# Patient Record
Sex: Male | Born: 1955 | Race: Black or African American | Hispanic: No | Marital: Married | Smoking: Former smoker
Health system: Southern US, Community
[De-identification: ages and names within clinical notes are randomized; demographics above are authoritative.]

## PROBLEM LIST (undated history)

## (undated) DIAGNOSIS — R0602 Shortness of breath: Secondary | ICD-10-CM

## (undated) DIAGNOSIS — G40909 Epilepsy, unspecified, not intractable, without status epilepticus: Secondary | ICD-10-CM

## (undated) DIAGNOSIS — Z21 Asymptomatic human immunodeficiency virus [HIV] infection status: Secondary | ICD-10-CM

## (undated) DIAGNOSIS — M25511 Pain in right shoulder: Secondary | ICD-10-CM

## (undated) DIAGNOSIS — B20 Human immunodeficiency virus [HIV] disease: Secondary | ICD-10-CM

## (undated) DIAGNOSIS — F419 Anxiety disorder, unspecified: Secondary | ICD-10-CM

## (undated) DIAGNOSIS — E46 Unspecified protein-calorie malnutrition: Secondary | ICD-10-CM

## (undated) DIAGNOSIS — IMO0002 Reserved for concepts with insufficient information to code with codable children: Secondary | ICD-10-CM

## (undated) DIAGNOSIS — C3490 Malignant neoplasm of unspecified part of unspecified bronchus or lung: Secondary | ICD-10-CM

## (undated) DIAGNOSIS — G40409 Other generalized epilepsy and epileptic syndromes, not intractable, without status epilepticus: Secondary | ICD-10-CM

## (undated) DIAGNOSIS — T421X1A Poisoning by iminostilbenes, accidental (unintentional), initial encounter: Secondary | ICD-10-CM

## (undated) DIAGNOSIS — G8929 Other chronic pain: Secondary | ICD-10-CM

## (undated) HISTORY — DX: Human immunodeficiency virus (HIV) disease: B20

## (undated) HISTORY — DX: Asymptomatic human immunodeficiency virus (hiv) infection status: Z21

## (undated) HISTORY — PX: FINE NEEDLE ASPIRATION: SHX406

---

## 2000-07-13 ENCOUNTER — Emergency Department (HOSPITAL_COMMUNITY): Admission: EM | Admit: 2000-07-13 | Discharge: 2000-07-13 | Payer: Self-pay | Admitting: *Deleted

## 2000-08-13 ENCOUNTER — Emergency Department (HOSPITAL_COMMUNITY): Admission: EM | Admit: 2000-08-13 | Discharge: 2000-08-13 | Payer: Self-pay | Admitting: Emergency Medicine

## 2000-08-13 ENCOUNTER — Encounter: Payer: Self-pay | Admitting: Emergency Medicine

## 2001-02-14 ENCOUNTER — Emergency Department (HOSPITAL_COMMUNITY): Admission: EM | Admit: 2001-02-14 | Discharge: 2001-02-15 | Payer: Self-pay | Admitting: Emergency Medicine

## 2001-03-15 ENCOUNTER — Encounter: Payer: Self-pay | Admitting: Emergency Medicine

## 2001-03-15 ENCOUNTER — Inpatient Hospital Stay (HOSPITAL_COMMUNITY): Admission: EM | Admit: 2001-03-15 | Discharge: 2001-03-18 | Payer: Self-pay | Admitting: Emergency Medicine

## 2001-03-16 ENCOUNTER — Encounter: Payer: Self-pay | Admitting: Internal Medicine

## 2001-12-08 ENCOUNTER — Emergency Department (HOSPITAL_COMMUNITY): Admission: EM | Admit: 2001-12-08 | Discharge: 2001-12-09 | Payer: Self-pay | Admitting: Emergency Medicine

## 2001-12-09 ENCOUNTER — Encounter: Payer: Self-pay | Admitting: Emergency Medicine

## 2003-08-30 ENCOUNTER — Emergency Department (HOSPITAL_COMMUNITY): Admission: EM | Admit: 2003-08-30 | Discharge: 2003-08-30 | Payer: Self-pay | Admitting: Emergency Medicine

## 2004-01-31 ENCOUNTER — Encounter (INDEPENDENT_AMBULATORY_CARE_PROVIDER_SITE_OTHER): Payer: Self-pay | Admitting: *Deleted

## 2004-01-31 DIAGNOSIS — B20 Human immunodeficiency virus [HIV] disease: Secondary | ICD-10-CM | POA: Insufficient documentation

## 2004-01-31 LAB — CONVERTED CEMR LAB
CD4 Count: 336 microliters
CD4 T Cell Abs: 336

## 2004-03-26 ENCOUNTER — Ambulatory Visit: Payer: Self-pay | Admitting: Infectious Diseases

## 2004-03-26 ENCOUNTER — Ambulatory Visit: Payer: Self-pay | Admitting: Internal Medicine

## 2004-03-26 ENCOUNTER — Inpatient Hospital Stay (HOSPITAL_COMMUNITY): Admission: EM | Admit: 2004-03-26 | Discharge: 2004-03-27 | Payer: Self-pay | Admitting: Emergency Medicine

## 2004-03-30 ENCOUNTER — Ambulatory Visit: Payer: Self-pay | Admitting: Internal Medicine

## 2004-04-11 ENCOUNTER — Ambulatory Visit (HOSPITAL_COMMUNITY): Admission: RE | Admit: 2004-04-11 | Discharge: 2004-04-11 | Payer: Self-pay | Admitting: Infectious Diseases

## 2004-04-11 ENCOUNTER — Ambulatory Visit: Payer: Self-pay | Admitting: Infectious Diseases

## 2004-04-25 ENCOUNTER — Ambulatory Visit: Payer: Self-pay | Admitting: Infectious Diseases

## 2004-04-25 ENCOUNTER — Ambulatory Visit (HOSPITAL_COMMUNITY): Admission: RE | Admit: 2004-04-25 | Discharge: 2004-04-25 | Payer: Self-pay | Admitting: Infectious Diseases

## 2004-06-12 ENCOUNTER — Ambulatory Visit: Payer: Self-pay | Admitting: Infectious Diseases

## 2004-07-21 ENCOUNTER — Emergency Department (HOSPITAL_COMMUNITY): Admission: EM | Admit: 2004-07-21 | Discharge: 2004-07-21 | Payer: Self-pay | Admitting: Emergency Medicine

## 2004-08-29 ENCOUNTER — Ambulatory Visit (HOSPITAL_COMMUNITY): Admission: RE | Admit: 2004-08-29 | Discharge: 2004-08-29 | Payer: Self-pay | Admitting: Infectious Diseases

## 2004-08-29 ENCOUNTER — Ambulatory Visit: Payer: Self-pay | Admitting: Infectious Diseases

## 2004-09-12 ENCOUNTER — Ambulatory Visit: Payer: Self-pay | Admitting: Infectious Diseases

## 2005-01-30 ENCOUNTER — Ambulatory Visit: Payer: Self-pay | Admitting: Infectious Diseases

## 2005-01-30 ENCOUNTER — Ambulatory Visit (HOSPITAL_COMMUNITY): Admission: RE | Admit: 2005-01-30 | Discharge: 2005-01-30 | Payer: Self-pay | Admitting: Infectious Diseases

## 2005-06-17 ENCOUNTER — Ambulatory Visit: Payer: Self-pay | Admitting: Infectious Diseases

## 2005-06-17 ENCOUNTER — Encounter (INDEPENDENT_AMBULATORY_CARE_PROVIDER_SITE_OTHER): Payer: Self-pay | Admitting: *Deleted

## 2005-06-17 LAB — CONVERTED CEMR LAB
CD4 Count: 190 microliters
HIV 1 RNA Quant: 6550 copies/mL

## 2005-07-29 ENCOUNTER — Ambulatory Visit: Payer: Self-pay | Admitting: Infectious Diseases

## 2005-08-29 ENCOUNTER — Encounter: Admission: RE | Admit: 2005-08-29 | Discharge: 2005-08-29 | Payer: Self-pay | Admitting: Infectious Diseases

## 2005-08-29 ENCOUNTER — Ambulatory Visit: Payer: Self-pay | Admitting: Infectious Diseases

## 2005-08-29 ENCOUNTER — Encounter (INDEPENDENT_AMBULATORY_CARE_PROVIDER_SITE_OTHER): Payer: Self-pay | Admitting: *Deleted

## 2005-08-29 LAB — CONVERTED CEMR LAB
CD4 Count: 160 uL
HIV 1 RNA Quant: 4710 {copies}/mL

## 2005-09-10 ENCOUNTER — Ambulatory Visit: Payer: Self-pay | Admitting: Infectious Diseases

## 2005-11-06 ENCOUNTER — Ambulatory Visit (HOSPITAL_COMMUNITY): Admission: RE | Admit: 2005-11-06 | Discharge: 2005-11-06 | Payer: Self-pay | Admitting: Infectious Diseases

## 2005-11-06 ENCOUNTER — Encounter: Payer: Self-pay | Admitting: Vascular Surgery

## 2005-11-06 ENCOUNTER — Ambulatory Visit: Payer: Self-pay | Admitting: Infectious Diseases

## 2006-01-28 ENCOUNTER — Encounter: Admission: RE | Admit: 2006-01-28 | Discharge: 2006-01-28 | Payer: Self-pay | Admitting: Infectious Diseases

## 2006-01-28 ENCOUNTER — Ambulatory Visit: Payer: Self-pay | Admitting: Infectious Diseases

## 2006-01-28 ENCOUNTER — Encounter (INDEPENDENT_AMBULATORY_CARE_PROVIDER_SITE_OTHER): Payer: Self-pay | Admitting: *Deleted

## 2006-01-28 LAB — CONVERTED CEMR LAB
CD4 Count: 210 microliters
HIV 1 RNA Quant: 49 copies/mL
HIV 1 RNA Quant: <50 copies/mL (ref <50)
HIV-1 RNA Quant, Log: <1.7 (ref <1.70)

## 2006-02-10 ENCOUNTER — Ambulatory Visit: Payer: Self-pay | Admitting: Infectious Diseases

## 2006-02-17 DIAGNOSIS — R569 Unspecified convulsions: Secondary | ICD-10-CM

## 2006-02-17 DIAGNOSIS — Z8611 Personal history of tuberculosis: Secondary | ICD-10-CM

## 2006-02-17 DIAGNOSIS — B359 Dermatophytosis, unspecified: Secondary | ICD-10-CM | POA: Insufficient documentation

## 2006-06-06 ENCOUNTER — Emergency Department (HOSPITAL_COMMUNITY): Admission: EM | Admit: 2006-06-06 | Discharge: 2006-06-07 | Payer: Self-pay | Admitting: Emergency Medicine

## 2006-06-16 ENCOUNTER — Encounter (INDEPENDENT_AMBULATORY_CARE_PROVIDER_SITE_OTHER): Payer: Self-pay | Admitting: *Deleted

## 2006-06-16 LAB — CONVERTED CEMR LAB

## 2006-06-29 ENCOUNTER — Encounter (INDEPENDENT_AMBULATORY_CARE_PROVIDER_SITE_OTHER): Payer: Self-pay | Admitting: *Deleted

## 2006-07-14 ENCOUNTER — Telehealth: Payer: Self-pay | Admitting: Infectious Diseases

## 2006-07-28 ENCOUNTER — Ambulatory Visit: Payer: Self-pay | Admitting: Infectious Diseases

## 2006-07-28 DIAGNOSIS — F17200 Nicotine dependence, unspecified, uncomplicated: Secondary | ICD-10-CM

## 2006-08-08 ENCOUNTER — Telehealth: Payer: Self-pay | Admitting: Infectious Diseases

## 2006-09-05 ENCOUNTER — Telehealth: Payer: Self-pay | Admitting: Infectious Diseases

## 2006-10-07 ENCOUNTER — Telehealth: Payer: Self-pay | Admitting: Infectious Diseases

## 2006-10-29 ENCOUNTER — Encounter (INDEPENDENT_AMBULATORY_CARE_PROVIDER_SITE_OTHER): Payer: Self-pay | Admitting: *Deleted

## 2006-10-29 ENCOUNTER — Telehealth: Payer: Self-pay | Admitting: *Deleted

## 2006-10-30 ENCOUNTER — Encounter: Payer: Self-pay | Admitting: Internal Medicine

## 2006-10-30 ENCOUNTER — Ambulatory Visit: Admission: RE | Admit: 2006-10-30 | Discharge: 2006-10-30 | Payer: Self-pay | Admitting: Internal Medicine

## 2006-10-30 ENCOUNTER — Encounter (INDEPENDENT_AMBULATORY_CARE_PROVIDER_SITE_OTHER): Payer: Self-pay | Admitting: Internal Medicine

## 2006-10-30 ENCOUNTER — Ambulatory Visit: Payer: Self-pay | Admitting: Internal Medicine

## 2006-10-30 ENCOUNTER — Ambulatory Visit (HOSPITAL_COMMUNITY): Admission: RE | Admit: 2006-10-30 | Discharge: 2006-10-30 | Payer: Self-pay | Admitting: Internal Medicine

## 2006-10-30 ENCOUNTER — Ambulatory Visit: Payer: Self-pay | Admitting: Vascular Surgery

## 2006-10-30 DIAGNOSIS — M25569 Pain in unspecified knee: Secondary | ICD-10-CM | POA: Insufficient documentation

## 2006-10-31 ENCOUNTER — Encounter (INDEPENDENT_AMBULATORY_CARE_PROVIDER_SITE_OTHER): Payer: Self-pay | Admitting: Internal Medicine

## 2006-10-31 ENCOUNTER — Ambulatory Visit: Payer: Self-pay | Admitting: Internal Medicine

## 2006-10-31 LAB — CONVERTED CEMR LAB
BUN: 12 mg/dL (ref 6–23)
CO2: 29 meq/L (ref 19–32)
Calcium: 8.8 mg/dL (ref 8.4–10.5)
Chloride: 105 meq/L (ref 96–112)
Creatinine, Ser: 0.96 mg/dL (ref 0.40–1.50)
Glucose, Bld: 85 mg/dL (ref 70–99)
Potassium: 4.7 meq/L (ref 3.5–5.3)
Sodium: 138 meq/L (ref 135–145)

## 2006-11-18 ENCOUNTER — Emergency Department (HOSPITAL_COMMUNITY): Admission: EM | Admit: 2006-11-18 | Discharge: 2006-11-19 | Payer: Self-pay | Admitting: Emergency Medicine

## 2006-12-05 ENCOUNTER — Telehealth: Payer: Self-pay | Admitting: Infectious Diseases

## 2006-12-27 ENCOUNTER — Emergency Department (HOSPITAL_COMMUNITY): Admission: EM | Admit: 2006-12-27 | Discharge: 2006-12-28 | Payer: Self-pay | Admitting: Emergency Medicine

## 2006-12-29 ENCOUNTER — Emergency Department (HOSPITAL_COMMUNITY): Admission: EM | Admit: 2006-12-29 | Discharge: 2006-12-29 | Payer: Self-pay | Admitting: Emergency Medicine

## 2007-01-07 ENCOUNTER — Telehealth: Payer: Self-pay | Admitting: Infectious Diseases

## 2007-01-08 ENCOUNTER — Encounter: Admission: RE | Admit: 2007-01-08 | Discharge: 2007-01-08 | Payer: Self-pay | Admitting: Infectious Diseases

## 2007-01-08 ENCOUNTER — Ambulatory Visit: Payer: Self-pay | Admitting: Infectious Diseases

## 2007-01-08 LAB — CONVERTED CEMR LAB
ALT: 15 units/L (ref 0–53)
AST: 20 units/L (ref 0–37)
Albumin: 4 g/dL (ref 3.5–5.2)
Alkaline Phosphatase: 70 units/L (ref 39–117)
BUN: 18 mg/dL (ref 6–23)
Basophils Absolute: 0 10*3/uL (ref 0.0–0.1)
Basophils Relative: 1 % (ref 0–1)
Bilirubin Urine: NEGATIVE
CO2: 25 meq/L (ref 19–32)
Calcium: 8.4 mg/dL (ref 8.4–10.5)
Chloride: 106 meq/L (ref 96–112)
Cholesterol: 148 mg/dL (ref 0–200)
Creatinine, Ser: 1.15 mg/dL (ref 0.40–1.50)
Eosinophils Absolute: 0.1 10*3/uL (ref 0.0–0.7)
Eosinophils Relative: 2 % (ref 0–5)
Glucose, Bld: 52 mg/dL — ABNORMAL LOW (ref 70–99)
HCT: 45.3 % (ref 39.0–52.0)
HDL: 43 mg/dL (ref >39)
HIV 1 RNA Quant: <50 copies/mL (ref <50)
HIV-1 RNA Quant, Log: <1.7 (ref <1.70)
Hemoglobin, Urine: NEGATIVE
Hemoglobin: 15.8 g/dL (ref 13.0–17.0)
Ketones, ur: NEGATIVE mg/dL
LDL Cholesterol: 93 mg/dL (ref 0–99)
Leukocytes, UA: NEGATIVE
Lymphocytes Relative: 34 % (ref 12–46)
Lymphs Abs: 1.3 10*3/uL (ref 0.7–3.3)
MCHC: 34.9 g/dL (ref 30.0–36.0)
MCV: 97.2 fL (ref 78.0–100.0)
Monocytes Absolute: 0.4 10*3/uL (ref 0.2–0.7)
Monocytes Relative: 10 % (ref 3–11)
Neutro Abs: 2.1 10*3/uL (ref 1.7–7.7)
Neutrophils Relative %: 53 % (ref 43–77)
Nitrite: NEGATIVE
Platelets: 231 10*3/uL (ref 150–400)
Potassium: 4.6 meq/L (ref 3.5–5.3)
Protein, ur: NEGATIVE mg/dL
RBC: 4.66 M/uL (ref 4.22–5.81)
RDW: 15 % — ABNORMAL HIGH (ref 11.5–14.0)
Sodium: 140 meq/L (ref 135–145)
Specific Gravity, Urine: 1.021 (ref 1.005–1.03)
Total Bilirubin: 0.4 mg/dL (ref 0.3–1.2)
Total CHOL/HDL Ratio: 3.4
Total Protein: 7.3 g/dL (ref 6.0–8.3)
Triglycerides: 59 mg/dL (ref <150)
Urine Glucose: NEGATIVE mg/dL
Urobilinogen, UA: 0.2 (ref 0.0–1.0)
VLDL: 12 mg/dL (ref 0–40)
WBC: 3.9 10*3/uL — ABNORMAL LOW (ref 4.0–10.5)
pH: 6.5 (ref 5.0–8.0)

## 2007-01-22 ENCOUNTER — Telehealth: Payer: Self-pay | Admitting: Infectious Diseases

## 2007-01-26 ENCOUNTER — Ambulatory Visit: Payer: Self-pay | Admitting: Infectious Diseases

## 2007-02-02 ENCOUNTER — Telehealth: Payer: Self-pay | Admitting: Infectious Diseases

## 2007-02-04 ENCOUNTER — Telehealth: Payer: Self-pay | Admitting: Infectious Diseases

## 2007-02-26 ENCOUNTER — Telehealth: Payer: Self-pay | Admitting: Infectious Diseases

## 2007-03-09 ENCOUNTER — Telehealth: Payer: Self-pay | Admitting: Infectious Diseases

## 2007-04-02 ENCOUNTER — Telehealth: Payer: Self-pay | Admitting: Infectious Diseases

## 2007-04-21 ENCOUNTER — Encounter (INDEPENDENT_AMBULATORY_CARE_PROVIDER_SITE_OTHER): Payer: Self-pay | Admitting: *Deleted

## 2007-05-11 ENCOUNTER — Telehealth: Payer: Self-pay | Admitting: Infectious Diseases

## 2007-05-13 ENCOUNTER — Telehealth: Payer: Self-pay | Admitting: Infectious Diseases

## 2007-05-29 ENCOUNTER — Encounter (INDEPENDENT_AMBULATORY_CARE_PROVIDER_SITE_OTHER): Payer: Self-pay | Admitting: *Deleted

## 2007-06-02 ENCOUNTER — Telehealth: Payer: Self-pay | Admitting: Infectious Diseases

## 2007-06-18 ENCOUNTER — Ambulatory Visit: Payer: Self-pay | Admitting: Infectious Diseases

## 2007-06-18 ENCOUNTER — Encounter: Admission: RE | Admit: 2007-06-18 | Discharge: 2007-06-18 | Payer: Self-pay | Admitting: Infectious Diseases

## 2007-06-18 LAB — CONVERTED CEMR LAB
ALT: 15 units/L (ref 0–53)
AST: 21 units/L (ref 0–37)
Albumin: 3.7 g/dL (ref 3.5–5.2)
Alkaline Phosphatase: 69 units/L (ref 39–117)
BUN: 13 mg/dL (ref 6–23)
Basophils Absolute: 0 10*3/uL (ref 0.0–0.1)
Basophils Relative: 0 % (ref 0–1)
Bilirubin Urine: NEGATIVE
CO2: 24 meq/L (ref 19–32)
Calcium: 8.5 mg/dL (ref 8.4–10.5)
Chloride: 108 meq/L (ref 96–112)
Cholesterol: 133 mg/dL (ref 0–200)
Creatinine, Ser: 1.04 mg/dL (ref 0.40–1.50)
Eosinophils Absolute: 0.1 10*3/uL (ref 0.0–0.7)
Eosinophils Relative: 2 % (ref 0–5)
Glucose, Bld: 88 mg/dL (ref 70–99)
HCT: 42.7 % (ref 39.0–52.0)
HDL: 41 mg/dL (ref >39)
HIV 1 RNA Quant: <50 copies/mL (ref <50)
HIV-1 RNA Quant, Log: <1.7 (ref <1.70)
Hemoglobin, Urine: NEGATIVE
Hemoglobin: 14.3 g/dL (ref 13.0–17.0)
Ketones, ur: NEGATIVE mg/dL
LDL Cholesterol: 80 mg/dL (ref 0–99)
Leukocytes, UA: NEGATIVE
Lymphocytes Relative: 34 % (ref 12–46)
Lymphs Abs: 1.4 10*3/uL (ref 0.7–4.0)
MCHC: 33.5 g/dL (ref 30.0–36.0)
MCV: 99.5 fL (ref 78.0–100.0)
Monocytes Absolute: 0.4 10*3/uL (ref 0.1–1.0)
Monocytes Relative: 11 % (ref 3–12)
Neutro Abs: 2.2 10*3/uL (ref 1.7–7.7)
Neutrophils Relative %: 53 % (ref 43–77)
Nitrite: NEGATIVE
Platelets: 226 10*3/uL (ref 150–400)
Potassium: 4.7 meq/L (ref 3.5–5.3)
Protein, ur: NEGATIVE mg/dL
RBC: 4.29 M/uL (ref 4.22–5.81)
RDW: 14.5 % (ref 11.5–15.5)
Sodium: 142 meq/L (ref 135–145)
Specific Gravity, Urine: 1.02 (ref 1.005–1.03)
Total Bilirubin: 0.4 mg/dL (ref 0.3–1.2)
Total CHOL/HDL Ratio: 3.2
Total Protein: 6.8 g/dL (ref 6.0–8.3)
Triglycerides: 60 mg/dL (ref <150)
Urine Glucose: NEGATIVE mg/dL
Urobilinogen, UA: 0.2 (ref 0.0–1.0)
VLDL: 12 mg/dL (ref 0–40)
WBC: 4.1 10*3/uL (ref 4.0–10.5)
pH: 6 (ref 5.0–8.0)

## 2007-07-07 ENCOUNTER — Telehealth: Payer: Self-pay | Admitting: Infectious Diseases

## 2007-07-13 ENCOUNTER — Ambulatory Visit: Payer: Self-pay | Admitting: Infectious Diseases

## 2007-08-04 ENCOUNTER — Telehealth (INDEPENDENT_AMBULATORY_CARE_PROVIDER_SITE_OTHER): Payer: Self-pay | Admitting: *Deleted

## 2007-09-07 ENCOUNTER — Telehealth (INDEPENDENT_AMBULATORY_CARE_PROVIDER_SITE_OTHER): Payer: Self-pay | Admitting: *Deleted

## 2007-10-05 ENCOUNTER — Telehealth (INDEPENDENT_AMBULATORY_CARE_PROVIDER_SITE_OTHER): Payer: Self-pay | Admitting: *Deleted

## 2007-10-30 ENCOUNTER — Encounter (INDEPENDENT_AMBULATORY_CARE_PROVIDER_SITE_OTHER): Payer: Self-pay | Admitting: *Deleted

## 2007-11-03 ENCOUNTER — Telehealth (INDEPENDENT_AMBULATORY_CARE_PROVIDER_SITE_OTHER): Payer: Self-pay | Admitting: *Deleted

## 2007-11-09 ENCOUNTER — Ambulatory Visit: Payer: Self-pay | Admitting: Infectious Diseases

## 2007-11-09 ENCOUNTER — Emergency Department (HOSPITAL_COMMUNITY): Admission: EM | Admit: 2007-11-09 | Discharge: 2007-11-09 | Payer: Self-pay | Admitting: Emergency Medicine

## 2007-11-09 ENCOUNTER — Encounter: Admission: RE | Admit: 2007-11-09 | Discharge: 2007-11-09 | Payer: Self-pay | Admitting: Infectious Diseases

## 2007-11-09 LAB — CONVERTED CEMR LAB
ALT: 17 units/L (ref 0–53)
AST: 25 units/L (ref 0–37)
Albumin: 4 g/dL (ref 3.5–5.2)
Alkaline Phosphatase: 84 units/L (ref 39–117)
BUN: 18 mg/dL (ref 6–23)
CO2: 26 meq/L (ref 19–32)
Calcium: 8.7 mg/dL (ref 8.4–10.5)
Chloride: 106 meq/L (ref 96–112)
Creatinine, Ser: 1.04 mg/dL (ref 0.40–1.50)
Glucose, Bld: 81 mg/dL (ref 70–99)
HCT: 42.4 % (ref 39.0–52.0)
HIV 1 RNA Quant: 72 copies/mL — ABNORMAL HIGH (ref <50)
HIV-1 RNA Quant, Log: 1.86 — ABNORMAL HIGH (ref <1.70)
Hemoglobin: 14.6 g/dL (ref 13.0–17.0)
MCHC: 34.4 g/dL (ref 30.0–36.0)
MCV: 99.5 fL (ref 78.0–100.0)
Platelets: 246 10*3/uL (ref 150–400)
Potassium: 4.7 meq/L (ref 3.5–5.3)
RBC: 4.26 M/uL (ref 4.22–5.81)
RDW: 15.1 % (ref 11.5–15.5)
Sodium: 141 meq/L (ref 135–145)
Total Bilirubin: 0.4 mg/dL (ref 0.3–1.2)
Total Protein: 7.1 g/dL (ref 6.0–8.3)
WBC: 3.9 10*3/uL — ABNORMAL LOW (ref 4.0–10.5)

## 2007-12-02 ENCOUNTER — Telehealth (INDEPENDENT_AMBULATORY_CARE_PROVIDER_SITE_OTHER): Payer: Self-pay | Admitting: *Deleted

## 2007-12-09 ENCOUNTER — Ambulatory Visit: Payer: Self-pay | Admitting: Infectious Diseases

## 2007-12-09 ENCOUNTER — Encounter: Payer: Self-pay | Admitting: Licensed Clinical Social Worker

## 2007-12-09 ENCOUNTER — Encounter (INDEPENDENT_AMBULATORY_CARE_PROVIDER_SITE_OTHER): Payer: Self-pay | Admitting: Licensed Clinical Social Worker

## 2008-01-04 ENCOUNTER — Telehealth (INDEPENDENT_AMBULATORY_CARE_PROVIDER_SITE_OTHER): Payer: Self-pay | Admitting: *Deleted

## 2008-01-28 ENCOUNTER — Telehealth (INDEPENDENT_AMBULATORY_CARE_PROVIDER_SITE_OTHER): Payer: Self-pay | Admitting: *Deleted

## 2008-02-02 ENCOUNTER — Emergency Department (HOSPITAL_COMMUNITY): Admission: EM | Admit: 2008-02-02 | Discharge: 2008-02-02 | Payer: Self-pay | Admitting: Emergency Medicine

## 2008-02-10 ENCOUNTER — Emergency Department (HOSPITAL_COMMUNITY): Admission: EM | Admit: 2008-02-10 | Discharge: 2008-02-10 | Payer: Self-pay | Admitting: Family Medicine

## 2008-03-01 ENCOUNTER — Ambulatory Visit: Payer: Self-pay | Admitting: Infectious Diseases

## 2008-03-01 LAB — CONVERTED CEMR LAB
ALT: 20 units/L (ref 0–53)
AST: 28 units/L (ref 0–37)
Albumin: 3.7 g/dL (ref 3.5–5.2)
Alkaline Phosphatase: 74 units/L (ref 39–117)
BUN: 20 mg/dL (ref 6–23)
Basophils Absolute: 0 10*3/uL (ref 0.0–0.1)
Basophils Relative: 0 % (ref 0–1)
CO2: 24 meq/L (ref 19–32)
Calcium: 8.6 mg/dL (ref 8.4–10.5)
Chloride: 109 meq/L (ref 96–112)
Creatinine, Ser: 1.02 mg/dL (ref 0.40–1.50)
Eosinophils Absolute: 0 10*3/uL (ref 0.0–0.7)
Eosinophils Relative: 1 % (ref 0–5)
Glucose, Bld: 81 mg/dL (ref 70–99)
HCT: 41.4 % (ref 39.0–52.0)
HIV 1 RNA Quant: <48 copies/mL (ref <48)
HIV-1 RNA Quant, Log: <1.68 (ref <1.68)
Hemoglobin: 14.5 g/dL (ref 13.0–17.0)
Hep A Total Ab: POSITIVE — AB
Lymphocytes Relative: 45 % (ref 12–46)
Lymphs Abs: 1.3 10*3/uL (ref 0.7–4.0)
MCHC: 35 g/dL (ref 30.0–36.0)
MCV: 98.6 fL (ref 78.0–100.0)
Monocytes Absolute: 0.4 10*3/uL (ref 0.1–1.0)
Monocytes Relative: 13 % — ABNORMAL HIGH (ref 3–12)
Neutro Abs: 1.2 10*3/uL — ABNORMAL LOW (ref 1.7–7.7)
Neutrophils Relative %: 41 % — ABNORMAL LOW (ref 43–77)
Platelets: 231 10*3/uL (ref 150–400)
Potassium: 4.6 meq/L (ref 3.5–5.3)
RBC: 4.2 M/uL — ABNORMAL LOW (ref 4.22–5.81)
RDW: 14.1 % (ref 11.5–15.5)
Sodium: 140 meq/L (ref 135–145)
Total Bilirubin: 0.4 mg/dL (ref 0.3–1.2)
Total Protein: 7.2 g/dL (ref 6.0–8.3)
WBC: 3 10*3/uL — ABNORMAL LOW (ref 4.0–10.5)

## 2008-03-02 ENCOUNTER — Telehealth (INDEPENDENT_AMBULATORY_CARE_PROVIDER_SITE_OTHER): Payer: Self-pay | Admitting: *Deleted

## 2008-03-14 ENCOUNTER — Ambulatory Visit: Payer: Self-pay | Admitting: Infectious Diseases

## 2008-03-30 ENCOUNTER — Telehealth (INDEPENDENT_AMBULATORY_CARE_PROVIDER_SITE_OTHER): Payer: Self-pay | Admitting: *Deleted

## 2008-04-04 ENCOUNTER — Emergency Department (HOSPITAL_COMMUNITY): Admission: EM | Admit: 2008-04-04 | Discharge: 2008-04-04 | Payer: Self-pay | Admitting: Family Medicine

## 2008-04-28 ENCOUNTER — Telehealth (INDEPENDENT_AMBULATORY_CARE_PROVIDER_SITE_OTHER): Payer: Self-pay | Admitting: *Deleted

## 2008-05-19 ENCOUNTER — Telehealth: Payer: Self-pay | Admitting: Infectious Diseases

## 2008-05-26 ENCOUNTER — Encounter: Payer: Self-pay | Admitting: Infectious Diseases

## 2008-05-30 ENCOUNTER — Telehealth (INDEPENDENT_AMBULATORY_CARE_PROVIDER_SITE_OTHER): Payer: Self-pay | Admitting: *Deleted

## 2008-05-31 ENCOUNTER — Encounter: Payer: Self-pay | Admitting: Infectious Diseases

## 2008-06-01 ENCOUNTER — Telehealth (INDEPENDENT_AMBULATORY_CARE_PROVIDER_SITE_OTHER): Payer: Self-pay | Admitting: *Deleted

## 2008-06-27 ENCOUNTER — Telehealth (INDEPENDENT_AMBULATORY_CARE_PROVIDER_SITE_OTHER): Payer: Self-pay | Admitting: *Deleted

## 2008-07-25 ENCOUNTER — Telehealth (INDEPENDENT_AMBULATORY_CARE_PROVIDER_SITE_OTHER): Payer: Self-pay | Admitting: *Deleted

## 2008-08-04 ENCOUNTER — Ambulatory Visit: Payer: Self-pay | Admitting: Infectious Diseases

## 2008-08-04 LAB — CONVERTED CEMR LAB
ALT: 21 units/L (ref 0–53)
AST: 25 units/L (ref 0–37)
Albumin: 4 g/dL (ref 3.5–5.2)
Alkaline Phosphatase: 67 units/L (ref 39–117)
BUN: 14 mg/dL (ref 6–23)
Basophils Absolute: 0 10*3/uL (ref 0.0–0.1)
Basophils Relative: 0 % (ref 0–1)
CO2: 25 meq/L (ref 19–32)
Calcium: 9.2 mg/dL (ref 8.4–10.5)
Carbamazepine Lvl: 6.4 ug/mL (ref 4.0–12.0)
Chloride: 105 meq/L (ref 96–112)
Cholesterol: 154 mg/dL (ref 0–200)
Creatinine, Ser: 1.12 mg/dL (ref 0.40–1.50)
Eosinophils Absolute: 0 10*3/uL (ref 0.0–0.7)
Eosinophils Relative: 1 % (ref 0–5)
GFR calc Af Amer: >60 mL/min (ref >60)
GFR calc non Af Amer: >60 mL/min (ref >60)
Glucose, Bld: 94 mg/dL (ref 70–99)
HCT: 38.9 % — ABNORMAL LOW (ref 39.0–52.0)
HDL: 39 mg/dL — ABNORMAL LOW (ref >39)
HIV 1 RNA Quant: 124 copies/mL — ABNORMAL HIGH (ref <48)
HIV-1 RNA Quant, Log: 2.09 — ABNORMAL HIGH (ref <1.68)
Hemoglobin: 13.5 g/dL (ref 13.0–17.0)
LDL Cholesterol: 100 mg/dL — ABNORMAL HIGH (ref 0–99)
Lymphocytes Relative: 48 % — ABNORMAL HIGH (ref 12–46)
Lymphs Abs: 1.5 10*3/uL (ref 0.7–4.0)
MCHC: 34.7 g/dL (ref 30.0–36.0)
MCV: 96.3 fL (ref 78.0–100.0)
Monocytes Absolute: 0.4 10*3/uL (ref 0.1–1.0)
Monocytes Relative: 14 % — ABNORMAL HIGH (ref 3–12)
Neutro Abs: 1.1 10*3/uL — ABNORMAL LOW (ref 1.7–7.7)
Neutrophils Relative %: 37 % — ABNORMAL LOW (ref 43–77)
Phenobarbital: 17.8 ug/mL (ref 15.0–40.0)
Platelets: 208 10*3/uL (ref 150–400)
Potassium: 4.4 meq/L (ref 3.5–5.3)
RBC: 4.04 M/uL — ABNORMAL LOW (ref 4.22–5.81)
RDW: 14.1 % (ref 11.5–15.5)
Sodium: 139 meq/L (ref 135–145)
Total Bilirubin: 0.3 mg/dL (ref 0.3–1.2)
Total CHOL/HDL Ratio: 3.9
Total Protein: 7.6 g/dL (ref 6.0–8.3)
Triglycerides: 77 mg/dL (ref <150)
VLDL: 15 mg/dL (ref 0–40)
WBC: 3 10*3/uL — ABNORMAL LOW (ref 4.0–10.5)

## 2008-08-18 ENCOUNTER — Telehealth (INDEPENDENT_AMBULATORY_CARE_PROVIDER_SITE_OTHER): Payer: Self-pay | Admitting: *Deleted

## 2008-08-19 ENCOUNTER — Ambulatory Visit: Payer: Self-pay | Admitting: Infectious Diseases

## 2008-09-15 ENCOUNTER — Telehealth (INDEPENDENT_AMBULATORY_CARE_PROVIDER_SITE_OTHER): Payer: Self-pay | Admitting: *Deleted

## 2008-10-18 ENCOUNTER — Telehealth (INDEPENDENT_AMBULATORY_CARE_PROVIDER_SITE_OTHER): Payer: Self-pay | Admitting: *Deleted

## 2008-11-14 ENCOUNTER — Telehealth (INDEPENDENT_AMBULATORY_CARE_PROVIDER_SITE_OTHER): Payer: Self-pay | Admitting: *Deleted

## 2008-12-12 ENCOUNTER — Telehealth (INDEPENDENT_AMBULATORY_CARE_PROVIDER_SITE_OTHER): Payer: Self-pay | Admitting: *Deleted

## 2009-01-11 ENCOUNTER — Telehealth (INDEPENDENT_AMBULATORY_CARE_PROVIDER_SITE_OTHER): Payer: Self-pay | Admitting: *Deleted

## 2009-01-13 ENCOUNTER — Telehealth: Payer: Self-pay

## 2009-01-18 ENCOUNTER — Ambulatory Visit: Payer: Self-pay | Admitting: Infectious Diseases

## 2009-01-18 LAB — CONVERTED CEMR LAB
ALT: 12 units/L (ref 0–53)
AST: 20 units/L (ref 0–37)
Albumin: 4.3 g/dL (ref 3.5–5.2)
Alkaline Phosphatase: 81 units/L (ref 39–117)
BUN: 11 mg/dL (ref 6–23)
Basophils Absolute: 0 10*3/uL (ref 0.0–0.1)
Basophils Relative: 1 % (ref 0–1)
CO2: 24 meq/L (ref 19–32)
Calcium: 9 mg/dL (ref 8.4–10.5)
Chloride: 107 meq/L (ref 96–112)
Creatinine, Ser: 1.1 mg/dL (ref 0.40–1.50)
Eosinophils Absolute: 0 10*3/uL (ref 0.0–0.7)
Eosinophils Relative: 2 % (ref 0–5)
Glucose, Bld: 93 mg/dL (ref 70–99)
HCT: 40.9 % (ref 39.0–52.0)
HIV 1 RNA Quant: 52 copies/mL — ABNORMAL HIGH (ref <48)
HIV-1 RNA Quant, Log: 1.72 — ABNORMAL HIGH (ref <1.68)
Hemoglobin: 14.1 g/dL (ref 13.0–17.0)
Lymphocytes Relative: 58 % — ABNORMAL HIGH (ref 12–46)
Lymphs Abs: 1.5 10*3/uL (ref 0.7–4.0)
MCHC: 34.5 g/dL (ref 30.0–36.0)
MCV: 97.1 fL (ref >=78.0)
Monocytes Absolute: 0.4 10*3/uL (ref 0.1–1.0)
Monocytes Relative: 14 % — ABNORMAL HIGH (ref 3–12)
Neutro Abs: 0.7 10*3/uL — ABNORMAL LOW (ref 1.7–7.7)
Neutrophils Relative %: 26 % — ABNORMAL LOW (ref 43–77)
Platelets: 228 10*3/uL (ref 150–400)
Potassium: 4.2 meq/L (ref 3.5–5.3)
RBC: 4.21 M/uL — ABNORMAL LOW (ref 4.22–5.81)
RDW: 13.9 % (ref 11.5–15.5)
Sodium: 139 meq/L (ref 135–145)
Total Bilirubin: 0.5 mg/dL (ref 0.3–1.2)
Total Protein: 7.8 g/dL (ref 6.0–8.3)
WBC: 2.6 10*3/uL — ABNORMAL LOW (ref 4.0–10.5)

## 2009-02-01 ENCOUNTER — Ambulatory Visit: Payer: Self-pay | Admitting: Infectious Diseases

## 2009-02-01 LAB — CONVERTED CEMR LAB
ALT: 17 units/L (ref 0–53)
AST: 24 units/L (ref 0–37)
Albumin: 4.3 g/dL (ref 3.5–5.2)
Alkaline Phosphatase: 79 units/L (ref 39–117)
BUN: 18 mg/dL (ref 6–23)
Basophils Absolute: 0 10*3/uL (ref 0.0–0.1)
Basophils Relative: 1 % (ref 0–1)
CO2: 21 meq/L (ref 19–32)
Calcium: 8.8 mg/dL (ref 8.4–10.5)
Chloride: 107 meq/L (ref 96–112)
Creatinine, Ser: 1.18 mg/dL (ref 0.40–1.50)
Eosinophils Absolute: 0.1 10*3/uL (ref 0.0–0.7)
Eosinophils Relative: 2 % (ref 0–5)
Glucose, Bld: 87 mg/dL (ref 70–99)
HCT: 41.4 % (ref 39.0–52.0)
HIV 1 RNA Quant: 55 copies/mL — ABNORMAL HIGH (ref <48)
HIV-1 RNA Quant, Log: 1.74 — ABNORMAL HIGH (ref <1.68)
Hemoglobin: 14.1 g/dL (ref 13.0–17.0)
Lymphocytes Relative: 45 % (ref 12–46)
Lymphs Abs: 1.3 10*3/uL (ref 0.7–4.0)
MCHC: 34.1 g/dL (ref 30.0–36.0)
MCV: 98.8 fL (ref >=78.0)
Monocytes Absolute: 0.4 10*3/uL (ref 0.1–1.0)
Monocytes Relative: 14 % — ABNORMAL HIGH (ref 3–12)
Neutro Abs: 1.1 10*3/uL — ABNORMAL LOW (ref 1.7–7.7)
Neutrophils Relative %: 39 % — ABNORMAL LOW (ref 43–77)
Platelets: 236 10*3/uL (ref 150–400)
Potassium: 4.6 meq/L (ref 3.5–5.3)
RBC: 4.19 M/uL — ABNORMAL LOW (ref 4.22–5.81)
RDW: 14.6 % (ref 11.5–15.5)
Sodium: 141 meq/L (ref 135–145)
Total Bilirubin: 0.3 mg/dL (ref 0.3–1.2)
Total Protein: 7.9 g/dL (ref 6.0–8.3)
WBC: 2.9 10*3/uL — ABNORMAL LOW (ref 4.0–10.5)

## 2009-02-08 ENCOUNTER — Telehealth (INDEPENDENT_AMBULATORY_CARE_PROVIDER_SITE_OTHER): Payer: Self-pay | Admitting: *Deleted

## 2009-03-13 ENCOUNTER — Telehealth (INDEPENDENT_AMBULATORY_CARE_PROVIDER_SITE_OTHER): Payer: Self-pay | Admitting: *Deleted

## 2009-03-15 ENCOUNTER — Telehealth (INDEPENDENT_AMBULATORY_CARE_PROVIDER_SITE_OTHER): Payer: Self-pay | Admitting: *Deleted

## 2009-04-06 ENCOUNTER — Telehealth (INDEPENDENT_AMBULATORY_CARE_PROVIDER_SITE_OTHER): Payer: Self-pay | Admitting: *Deleted

## 2009-05-04 ENCOUNTER — Telehealth (INDEPENDENT_AMBULATORY_CARE_PROVIDER_SITE_OTHER): Payer: Self-pay | Admitting: *Deleted

## 2009-05-18 ENCOUNTER — Telehealth (INDEPENDENT_AMBULATORY_CARE_PROVIDER_SITE_OTHER): Payer: Self-pay | Admitting: *Deleted

## 2009-06-03 ENCOUNTER — Telehealth (INDEPENDENT_AMBULATORY_CARE_PROVIDER_SITE_OTHER): Payer: Self-pay | Admitting: *Deleted

## 2009-07-03 ENCOUNTER — Telehealth (INDEPENDENT_AMBULATORY_CARE_PROVIDER_SITE_OTHER): Payer: Self-pay | Admitting: *Deleted

## 2009-07-26 ENCOUNTER — Encounter: Payer: Self-pay | Admitting: Infectious Diseases

## 2009-07-28 ENCOUNTER — Telehealth (INDEPENDENT_AMBULATORY_CARE_PROVIDER_SITE_OTHER): Payer: Self-pay | Admitting: *Deleted

## 2009-08-02 ENCOUNTER — Ambulatory Visit: Payer: Self-pay | Admitting: Infectious Diseases

## 2009-08-02 LAB — CONVERTED CEMR LAB
ALT: 14 units/L (ref 0–53)
AST: 22 units/L (ref 0–37)
Albumin: 4.2 g/dL (ref 3.5–5.2)
Alkaline Phosphatase: 79 units/L (ref 39–117)
BUN: 14 mg/dL (ref 6–23)
Basophils Absolute: 0 10*3/uL (ref 0.0–0.1)
Basophils Relative: 1 % (ref 0–1)
CO2: 27 meq/L (ref 19–32)
Calcium: 8.8 mg/dL (ref 8.4–10.5)
Chloride: 104 meq/L (ref 96–112)
Cholesterol: 152 mg/dL (ref 0–200)
Creatinine, Ser: 1.03 mg/dL (ref 0.40–1.50)
Eosinophils Absolute: 0.1 10*3/uL (ref 0.0–0.7)
Eosinophils Relative: 2 % (ref 0–5)
Glucose, Bld: 76 mg/dL (ref 70–99)
HCT: 41.9 % (ref 39.0–52.0)
HDL: 41 mg/dL (ref >39)
HIV 1 RNA Quant: 56 copies/mL — ABNORMAL HIGH (ref <48)
HIV-1 RNA Quant, Log: 1.75 — ABNORMAL HIGH (ref <1.68)
Hemoglobin: 14.5 g/dL (ref 13.0–17.0)
LDL Cholesterol: 100 mg/dL — ABNORMAL HIGH (ref 0–99)
Lymphocytes Relative: 45 % (ref 12–46)
Lymphs Abs: 1.1 10*3/uL (ref 0.7–4.0)
MCHC: 34.6 g/dL (ref 30.0–36.0)
MCV: 97 fL (ref 78.0–100.0)
Monocytes Absolute: 0.3 10*3/uL (ref 0.1–1.0)
Monocytes Relative: 13 % — ABNORMAL HIGH (ref 3–12)
Neutro Abs: 1 10*3/uL — ABNORMAL LOW (ref 1.7–7.7)
Neutrophils Relative %: 39 % — ABNORMAL LOW (ref 43–77)
Platelets: 237 10*3/uL (ref 150–400)
Potassium: 4.5 meq/L (ref 3.5–5.3)
RBC: 4.32 M/uL (ref 4.22–5.81)
RDW: 14.7 % (ref 11.5–15.5)
Sodium: 140 meq/L (ref 135–145)
Total Bilirubin: 0.3 mg/dL (ref 0.3–1.2)
Total CHOL/HDL Ratio: 3.7
Total Protein: 7.6 g/dL (ref 6.0–8.3)
Triglycerides: 57 mg/dL (ref <150)
VLDL: 11 mg/dL (ref 0–40)
WBC: 2.5 10*3/uL — ABNORMAL LOW (ref 4.0–10.5)

## 2009-08-23 ENCOUNTER — Ambulatory Visit: Payer: Self-pay | Admitting: Infectious Diseases

## 2009-09-04 ENCOUNTER — Telehealth (INDEPENDENT_AMBULATORY_CARE_PROVIDER_SITE_OTHER): Payer: Self-pay | Admitting: *Deleted

## 2009-09-27 ENCOUNTER — Encounter (INDEPENDENT_AMBULATORY_CARE_PROVIDER_SITE_OTHER): Payer: Self-pay | Admitting: *Deleted

## 2009-09-27 ENCOUNTER — Telehealth (INDEPENDENT_AMBULATORY_CARE_PROVIDER_SITE_OTHER): Payer: Self-pay | Admitting: *Deleted

## 2009-10-01 ENCOUNTER — Emergency Department (HOSPITAL_COMMUNITY): Admission: EM | Admit: 2009-10-01 | Discharge: 2009-10-02 | Payer: Self-pay | Admitting: Emergency Medicine

## 2009-10-16 ENCOUNTER — Telehealth (INDEPENDENT_AMBULATORY_CARE_PROVIDER_SITE_OTHER): Payer: Self-pay | Admitting: *Deleted

## 2010-01-04 ENCOUNTER — Ambulatory Visit: Payer: Self-pay | Admitting: Infectious Diseases

## 2010-01-04 ENCOUNTER — Emergency Department (HOSPITAL_COMMUNITY): Admission: EM | Admit: 2010-01-04 | Discharge: 2010-01-04 | Payer: Self-pay | Admitting: Family Medicine

## 2010-01-04 LAB — CONVERTED CEMR LAB
ALT: 21 units/L (ref 0–53)
AST: 28 units/L (ref 0–37)
Albumin: 4 g/dL (ref 3.5–5.2)
Alkaline Phosphatase: 73 units/L (ref 39–117)
BUN: 16 mg/dL (ref 6–23)
Basophils Absolute: 0 10*3/uL (ref 0.0–0.1)
Basophils Relative: 0 % (ref 0–1)
CO2: 27 meq/L (ref 19–32)
Calcium: 8.6 mg/dL (ref 8.4–10.5)
Chloride: 103 meq/L (ref 96–112)
Creatinine, Ser: 1.01 mg/dL (ref 0.40–1.50)
Eosinophils Absolute: 0.1 10*3/uL (ref 0.0–0.7)
Eosinophils Relative: 2 % (ref 0–5)
Glucose, Bld: 86 mg/dL (ref 70–99)
HCT: 39 % (ref 39.0–52.0)
Hemoglobin: 13.6 g/dL (ref 13.0–17.0)
Lymphocytes Relative: 41 % (ref 12–46)
Lymphs Abs: 2.1 10*3/uL (ref 0.7–4.0)
MCHC: 34.9 g/dL (ref 30.0–36.0)
MCV: 94 fL (ref 78.0–100.0)
Monocytes Absolute: 0.6 10*3/uL (ref 0.1–1.0)
Monocytes Relative: 11 % (ref 3–12)
Neutro Abs: 2.3 10*3/uL (ref 1.7–7.7)
Neutrophils Relative %: 45 % (ref 43–77)
Platelets: 216 10*3/uL (ref 150–400)
Potassium: 4.9 meq/L (ref 3.5–5.3)
RBC: 4.15 M/uL — ABNORMAL LOW (ref 4.22–5.81)
RDW: 14.1 % (ref 11.5–15.5)
Sodium: 138 meq/L (ref 135–145)
Total Bilirubin: 0.3 mg/dL (ref 0.3–1.2)
Total Protein: 8 g/dL (ref 6.0–8.3)
WBC: 5.1 10*3/uL (ref 4.0–10.5)

## 2010-01-08 ENCOUNTER — Encounter: Payer: Self-pay | Admitting: Infectious Diseases

## 2010-01-08 LAB — CONVERTED CEMR LAB
HIV 1 RNA Quant: 7980 copies/mL — ABNORMAL HIGH (ref <20)
HIV-1 RNA Quant, Log: 3.9 — ABNORMAL HIGH (ref <1.30)

## 2010-01-15 ENCOUNTER — Encounter: Payer: Self-pay | Admitting: Infectious Diseases

## 2010-02-14 ENCOUNTER — Ambulatory Visit: Payer: Self-pay | Admitting: Infectious Diseases

## 2010-05-24 NOTE — Miscellaneous (Signed)
Summary: Appointment No Show  Appointment status changed to no show by [employer] on 07/26/2009 4:36 PM.  No Show Comments ---------------- LABS/VS  Appointment Information ----------------------- Appt Type:  LAB NO DOCUMENT      Date:  Wednesday, July 26, 2009      Time:  10:30 AM for 30 min   Urgency:  Routine   Made By:  Pearson Grippe  To Visit:  [id number]    Reason:  LABS/VS  Appt Comments ------------- -- 07/26/09 16:36: ([facility]) NO SHOW -- LABS/VS -- 07/24/09 14:53: ([facility]) BOOKED -- Routine LAB NO DOCUMENT at 07/26/2009 10:30 AM for 30 min LABS/VS -- 07/24/09 14:53: ([facility]) BOOKED -- Routine LAB NO DOCUMENT at 07/26/2009 10:30 AM for 30 min LABS/VS -- 3

## 2010-05-24 NOTE — Progress Notes (Signed)
Summary: Pt assist meds arrived via PAP---a 3 months supply  Phone Note Refill Request      Prescriptions: TEGRETOL 200 MG TABS (CARBAMAZEPINE) 600mg  by mouth every morning, 400mg  by mouth at noon, and 600mg  by mouth at bedtime  #800 x 0   Entered by:   Paulo Fruit  BS,CPht II,MPH   Authorized by:   [name] MD   Signed by:   Paulo Fruit  BS,CPht II,MPH on 10/16/2009   Method used:   Samples Given   RxID:   [id number]   Patient Assist Medication Verification: Medication: Tegretol 200mg  Lot# [id number] Exp Date:Feb 2014 Tech approval:MLD Call placed to patient with message that assistance medications are ready for pick-up. Left message with a male for patient to contact office. Paulo Fruit  BS,CPht II,MPH  October 16, 2009 2:36 PM

## 2010-05-24 NOTE — Progress Notes (Signed)
Summary: Patient assit med arrived for Jan  Phone Note Refill Request      Prescriptions: ATRIPLA 600-200-300 MG TABS (EFAVIRENZ-EMTRICITAB-TENOFOVIR) Take 1 tablet by mouth at bedtime  #30 x 0   Entered by:   Paulo Fruit  BS,CPht II,MPH   Authorized by:   [name] MD   Signed by:   Paulo Fruit  BS,CPht II,MPH on 05/04/2009   Method used:   Samples Given   RxID:   [id number]   Patient Assist Medication Verification: Medication: Atripla Lot# [id number] Exp Date:06 2013 Tech approval:MLD Call placed to patient with message that assistance medications are ready for pick-up. Left message with Ms. [name], patient's girlfriend that always come with him.  Informed her that I left message last month with a male for Asher to call office.  She said that was her son, and he must have forgot.  However, they will be in tomorrow, Friday, May 05, 2009 to pick it up.Paulo Fruit  BS,CPht II,MPH  May 04, 2009 4:12 PM                  Appended Document: Patient assit med arrived for Jan Prescription/Samples picked up by: patient

## 2010-05-24 NOTE — Progress Notes (Signed)
Summary: Patient assistance application mailed  Phone Note Refill Request    Follow-up for Phone Call        Rx signed and patient assistance application has been mailed. Follow-up by: Paulo Fruit  BS,CPht II,MPH,  October 03, 2009 2:55 PM    Prescriptions: TEGRETOL 200 MG TABS (CARBAMAZEPINE) 600mg  by mouth every morning, 400mg  by mouth at noon, and 600mg  by mouth at bedtime  #720 x 3   Entered by:   Paulo Fruit  BS,CPht II,MPH   Authorized by:   [name] MD   Signed by:   Paulo Fruit  BS,CPht II,MPH on 09/27/2009   Method used:   Printed then mailed to ...         RxID:   [id number]  Waiting for physician signature.  Patient lost Medicaid and is unable to afford cash price at [facility]. Paulo Fruit  BS,CPht II,MPH  September 27, 2009 11:04 AM

## 2010-05-24 NOTE — Miscellaneous (Signed)
Summary: Orders Update - LABS  Clinical Lists Changes  Problems: Added new problem of ENCOUNTER FOR LONG-TERM USE OF OTHER MEDICATIONS (ICD-V58.69) Orders: Added new Test order of T-CBC w/Diff [phone]) - Signed Added new Test order of T-CD4SP [facility]) (CD4SP) - Signed Added new Test order of T-Comprehensive Metabolic Panel [phone]) - Signed Added new Test order of T-HIV Viral Load [phone]) - Signed Added new Test order of T-RPR (Syphilis) [phone]) - Signed Added new Test order of T-Lipid Profile ([id number]) - Signed     Process Orders Check Orders Results:     [facility]: ABN not required for this insurance Order queued for requisitioning for [facility]: August 02, 2009 2:00 PM  Tests Sent for requisitioning (August 02, 2009 2:00 PM):     08/02/2009: [facility] -- T-CBC w/Diff [[id number]] (signed)     08/02/2009: [facility] -- T-Comprehensive Metabolic Panel [[id number]] (signed)     08/02/2009: [facility] -- T-HIV Viral Load [phone] (signed)     08/02/2009: [facility] -- T-RPR (Syphilis) [phone] (signed)     08/02/2009: [facility] -- T-Lipid Profile [phone] (signed)

## 2010-05-24 NOTE — Assessment & Plan Note (Signed)
Summary: F/U OV/VS   Primary Provider:  Dr [name]  CC:  follow-up visit.  History of Present Illness: 55 yo M with HIV+ who has been maintained on [place].  Last CD4 160 (prev 210), VL 7980 (12-2009). Had genotype showing background PI mutations. Says he has been keeping to himself, trying to avoid alot of frustrations.  Has gotten medicare, needs ART refill.   Preventive Screening-Counseling & Management  Alcohol-Tobacco     Alcohol drinks/day: 0     Smoking Status: current-occassionally     Smoking Cessation Counseling: yes     Packs/Day: 0.25     Year Started: 30 years ago     Year Quit: 2010     Cans of tobacco/week: no  Caffeine-Diet-Exercise     Caffeine use/day: 0     Does Patient Exercise: yes     Type of exercise: walking     Exercise (avg: min/session): >60     Times/week: 7  Safety-Violence-Falls     Seat Belt Use: yes   Updated Prior Medication List: TEGRETOL 200 MG TABS (CARBAMAZEPINE) 600mg  by mouth every morning, 400mg  by mouth at noon, and 600mg  by mouth at bedtime PHENOBARBITAL 60 MG TABS (PHENOBARBITAL) take one tab by mouth at bedtime ATRIPLA 600-200-300 MG TABS (EFAVIRENZ-EMTRICITAB-TENOFOVIR) Take 1 tablet by mouth at bedtime  Current Allergies (reviewed today): No known allergies  Past History:  Past medical, surgical, family and social histories (including risk factors) reviewed, and no changes noted (except as noted below).  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 Family History Seizures  Social History: Reviewed history from 08/23/2009 and no changes required. IN relationship Current Smoker Alcohol use-no Drug use-no  Review of Systems       wt steady, still smoking,   Current Medications (verified): 1)  Tegretol 200 Mg  Tabs (Carbamazepine) .... 600mg  By Mouth Every Morning, 400mg  By Mouth At Noon, and 600mg  By Mouth At Bedtime 2)  Phenobarbital 60 Mg Tabs (Phenobarbital) .... Take One Tab By Mouth At Bedtime 3)  Atripla 600-200-300 Mg Tabs (Efavirenz-Emtricitab-Tenofovir) .... Take 1 Tablet By Mouth At Bedtime  Allergies (verified): No Known Drug Allergies   Vital Signs:  Patient profile:   55 year old male Height:      70 inches (177.80 cm) Weight:      164.4 pounds (74.73 kg) BMI:     23.67 Temp:     98.1 degrees F (36.72 degrees C) oral Pulse rate:   75 / minute BP sitting:   100 / 67  (left arm)  Vitals Entered By: Baxter Hire) (February 14, 2010 9:25 AM) CC: follow-up visit Pain Assessment Patient in pain? no      Nutritional Status BMI of 19 -24 = normal Nutritional Status Detail appetite is good per patient  Have you ever been in a relationship where you felt threatened, hurt or afraid?No   Does patient need assistance? Functional Status Self care Ambulation Normal    Physical Exam  General:  well-developed, well-nourished, and well-hydrated.   Eyes:  pupils equal, pupils round, and pupils reactive to light.   Mouth:  pharynx pink and moist and no exudates.   Lungs:  normal respiratory effort and normal breath sounds.   Heart:  normal rate, regular rhythm, and no murmur.   Abdomen:  soft, non-tender, and normal  bowel sounds.     Impression & Recommendations:  Problem # 1:  HIV DISEASE (ICD-042)  needs to get back onto ART and be adherent. his meds are refilled. given condoms. given Rx for ensure. will see him back in 3-4 months due to his detectable VL.   Orders: Neurology Referral (Neuro)  Problem # 2:  SEIZURE DISORDER (ICD-780.39)  1 seizure since last visit. was taken to ER.  wants to be cleared to drive. Must be seen by Neuro first.  His updated medication list for this problem includes:    Tegretol 200 Mg Tabs (Carbamazepine) ..... 600mg  by mouth every  morning, 400mg  by mouth at noon, and 600mg  by mouth at bedtime    Phenobarbital 60 Mg Tabs (Phenobarbital) .Marland Kitchen... Take one tab by mouth at bedtime  Orders: Neurology Referral (Neuro)  Problem # 3:  TOBACCO USER (ICD-305.1) encouraged to quit!  Other Orders: Est. Patient Level IV (16109) Future Orders: T-CD4SP ([facility]) (CD4SP) ... 05/15/2010 T-HIV Viral Load [phone]) ... 05/15/2010 T-Comprehensive Metabolic Panel [phone]) ... 05/15/2010 T-CBC w/Diff ([id number]) ... 05/15/2010 T-RPR (Syphilis) [phone]) ... 05/15/2010 T-Lipid Profile [phone]) ... 05/15/2010  Prescriptions: ATRIPLA 600-200-300 MG TABS (EFAVIRENZ-EMTRICITAB-TENOFOVIR) Take 1 tablet by mouth at bedtime  #180 x 4   Entered and Authorized by:   [name] MD   Signed by:   [name] MD on 02/14/2010   Method used:   Electronically to        [facility] (retail)       [address]       [place], [place]  [id number]       Ph: [phone]       Fax: [phone]   RxID:   [id number]   Appended Document: F/U OV/VS        Medication Adherence: 02/14/2010   Adherence to medications reviewed with patient. Counseling to provide adequate adherence provided    Prevention For Positives: 02/07/2010   Safe sex practices discussed with patient. Condoms offered.

## 2010-05-24 NOTE — Progress Notes (Signed)
Summary: Pt assist med arrived via [facility] for Mar  Phone Note Refill Request      Prescriptions: ATRIPLA 600-200-300 MG TABS (EFAVIRENZ-EMTRICITAB-TENOFOVIR) Take 1 tablet by mouth at bedtime  #30 x 0   Entered by:   Paulo Fruit  BS,CPht II,MPH   Authorized by:   [name] MD   Signed by:   Paulo Fruit  BS,CPht II,MPH on 07/03/2009   Method used:   Samples Given   RxID:   [id number]   Patient Assist Medication Verification: Medication: Atripla Lot# [id number] Exp Date:07 2013 Tech approval:MLD Left message for patient to call office Paulo Fruit  BS,CPht II,MPH  July 03, 2009 3:44 PM

## 2010-05-24 NOTE — Progress Notes (Signed)
Summary: Pt. assist med arrived via [facility] for Apr  Phone Note Refill Request      Prescriptions: ATRIPLA 600-200-300 MG TABS (EFAVIRENZ-EMTRICITAB-TENOFOVIR) Take 1 tablet by mouth at bedtime  #30 x 0   Entered by:   Paulo Fruit  BS,CPht II,MPH   Authorized by:   [name] MD   Signed by:   Paulo Fruit  BS,CPht II,MPH on 07/28/2009   Method used:   Samples Given   RxID:   [id number]   Patient Assist Medication Verification: Medication: Atripla Lot# [id number] Exp Date:08 2013 Tech approval:MLD Call placed to patient with message that assistance medications are ready for pick-up. Left message with Ms. Elita Quick ( his girlfriend per Mr. Sanluis instructions) Paulo Fruit  BS,CPht II,MPH  July 28, 2009 11:16 AM

## 2010-05-24 NOTE — Assessment & Plan Note (Signed)
Summary: f/u appt /vs   Primary Provider:  Dr [name]  CC:  follow-up visit.  History of Present Illness: 55 yo M with HIV+ who has been maintained on [place].  Last CD4 210 (prev 180), VL 56 (08-02-09). WBC2.5 with ANC 1000. Was seen by Derm 12-10 and wasn't satisfied. THen went to UC to have area lanced.   Preventive Screening-Counseling & Management  Alcohol-Tobacco     Alcohol drinks/day: 0     Smoking Status: quit     Smoking Cessation Counseling: yes     Packs/Day: 0.25     Year Started: 30 years ago     Year Quit: 2010     Cans of tobacco/week: no  Caffeine-Diet-Exercise     Caffeine use/day: 0     Does Patient Exercise: yes     Type of exercise: walking     Exercise (avg: min/session): >60     Times/week: 7  Safety-Violence-Falls     Seat Belt Use: yes  Current Medications (verified): 1)  Tegretol 200 Mg Tabs (Carbamazepine) .... 600mg  By Mouth Every Morning, 400mg  By Mouth At Noon, and 600mg  By Mouth At Bedtime 2)  Phenobarbital 60 Mg Tabs (Phenobarbital) .... Take One Tab By Mouth At Bedtime 3)  Atripla 600-200-300 Mg Tabs (Efavirenz-Emtricitab-Tenofovir) .... Take 1 Tablet By Mouth At Bedtime  Allergies (verified): No Known Drug Allergies    Updated Prior Medication List: TEGRETOL 200 MG TABS (CARBAMAZEPINE) 600mg  by mouth every morning, 400mg  by mouth at noon, and 600mg  by mouth at bedtime PHENOBARBITAL 60 MG TABS (PHENOBARBITAL) take one tab by mouth at bedtime ATRIPLA 600-200-300 MG TABS (EFAVIRENZ-EMTRICITAB-TENOFOVIR) Take 1 tablet by mouth at bedtime  Current Allergies (reviewed today): No known allergies  Social History: IN relationship Current Smoker Alcohol use-no Drug use-no  Review of Systems       wt steady, eating well, moving bowels well,   Vital Signs:  Patient profile:   55 year old male Height:      70 inches (177.80 cm) Weight:      163.8 pounds (74.45 kg) BMI:     23.59 Temp:     97.7 degrees F (36.50 degrees C)  oral Pulse rate:   64 / minute BP sitting:   127 / 85  (right arm)  Vitals Entered By: Baxter Hire) (Aug 23, 2009 10:29 AM) CC: follow-up visit Pain Assessment Patient in pain? no      Nutritional Status BMI of 19 -24 = normal Nutritional Status Detail appetite is good per patient  Have you ever been in a relationship where you felt threatened, hurt or afraid?No   Does patient need assistance? Functional Status Self care Ambulation Normal        Medication Adherence: 08/23/2009   Adherence to medications reviewed with patient. Counseling to provide adequate adherence provided   Prevention For Positives: 08/23/2009   Safe sex practices discussed with patient. Condoms offered.                             Physical Exam  General:  well-developed, well-nourished, and well-hydrated.   Eyes:  pupils equal, pupils round, and pupils reactive to light.   Mouth:  pharynx pink and moist and no exudates.   Neck:  no masses.   Lungs:  normal respiratory effort and normal breath sounds.   Heart:  normal rate, regular rhythm, and no murmur.   Abdomen:  soft, non-tender, and normal  bowel sounds.   Skin:  subcutaneously nodule on forehead. nontender, freely mobile.  he has 1 cm subaceous cyst on R earlobe. i am unable to express.    Impression & Recommendations:  Problem # 1:  HIV DISEASE (ICD-042) he is doing well. cont his current meds. has condoms. he wants to go back to UC for lancing of his subaeous cyst. return to clinic 4-5 months.   Problem # 2:  SEIZURE DISORDER (ICD-780.39) 1 sz since last visit. is not clear that he forgot his sz medicine that day. will cont to watch.  His updated medication list for this problem includes:    Tegretol 200 Mg Tabs (Carbamazepine) ..... 600mg  by mouth every morning, 400mg  by mouth at noon, and 600mg  by mouth at bedtime    Phenobarbital 60 Mg Tabs (Phenobarbital) .Marland Kitchen... Take one tab by mouth at bedtime  Other Orders: Est.  Patient Level IV (42595) Future Orders: T-CD4SP ([facility]) (CD4SP) ... 11/21/2009 T-HIV Viral Load [phone]) ... 11/21/2009 T-Comprehensive Metabolic Panel [phone]) ... 11/21/2009 T-CBC w/Diff ([id number]) ... 11/21/2009  Process Orders Check Orders Results:     [facility]: ABN not required for this insurance Tests Sent for requisitioning (Aug 23, 2009 11:00 AM):     11/21/2009: [facility] -- T-HIV Viral Load [phone] (signed)     11/21/2009: [facility] -- T-Comprehensive Metabolic Panel [[id number]] (signed)     11/21/2009: [facility] -- [facility] w/Diff [[id number]] (signed)

## 2010-05-24 NOTE — Miscellaneous (Signed)
Summary: clinical update/ryan white [employer] app completed  Clinical Lists Changes  Observations: Added new observation of INCOMESOURCE: SSDI (09/27/2009 11:16) Added new observation of PCTFPL: 82.11  (09/27/2009 11:16) Added new observation of AIDSDAP: Pending  (09/27/2009 11:16) Added new observation of HOUSEINCOME: 8892  (09/27/2009 11:16) Added new observation of FINASSESSDT: 09/26/2009  (09/27/2009 11:16) Added new observation of RW VITAL STA: Active  (09/27/2009 11:16)

## 2010-05-24 NOTE — Progress Notes (Signed)
Summary: refill/ hla  Phone Note Refill Request Message from:  Patient on May 18, 2009 11:50 AM  Refills Requested: Medication #1:  PHENOBARBITAL 60 MG TABS take one tab by mouth at bedtime Initial call taken by: Marin Roberts RN,  May 18, 2009 11:50 AM  Follow-up for Phone Call        all taken care of Follow-up by: Paulo Fruit  BS,CPht II,MPH,  May 19, 2009 11:37 AM

## 2010-05-24 NOTE — Progress Notes (Signed)
Summary: Pt. assist med arrived via [facility] for Feb  Phone Note Refill Request      Prescriptions: ATRIPLA 600-200-300 MG TABS (EFAVIRENZ-EMTRICITAB-TENOFOVIR) Take 1 tablet by mouth at bedtime  #30 x 0   Entered by:   Paulo Fruit  BS,CPht II,MPH   Authorized by:   [name] MD   Signed by:   Paulo Fruit  BS,CPht II,MPH on 06/03/2009   Method used:   Samples Given   RxID:   [id number]   Patient Assist Medication Verification: Medication: Atripla Lot# [id number] Exp Date:06 2012 Tech approval:MLD Will call on Monday, 06/05/09 Paulo Fruit  BS,CPht II,MPH  June 03, 2009 10:34 AM

## 2010-05-24 NOTE — Progress Notes (Signed)
Summary: Rx not able to ship meds--Insurance expired  Phone Note Outgoing Call   Caller: Fax from [facility] Reason for Call: Get patient information Summary of Call: [facility] is unable to ship patient's medication for the month of May because his Medicaid card has expired and he is not on [place]. Initial call taken by: Paulo Fruit  BS,CPht II,MPH,  Sep 04, 2009 2:44 PM Call placed by: Paulo Fruit  BS,CPht II,MPH,  Sep 04, 2009 2:44 PM Call placed to: Patient Reason for Call: Get patient information Summary of Call: Left message for patient to contact office as soon as possible. Initial call taken by: Paulo Fruit  BS,CPht II,MPH,  Sep 04, 2009 2:45 PM

## 2010-06-21 ENCOUNTER — Ambulatory Visit: Payer: Medicare Other

## 2010-06-21 ENCOUNTER — Encounter: Payer: Self-pay | Admitting: Infectious Diseases

## 2010-06-28 NOTE — Assessment & Plan Note (Signed)
Summary: flu shot    Allergies: No Known Drug Allergies   Other Orders: Influenza Vaccine NON MCR (96295)   Orders Added: 1)  Influenza Vaccine NON MCR [00028]   Immunizations Administered:  Influenza Vaccine # 1:    Vaccine Type: Fluvax Non-MCR    Site: left deltoid    Mfr: GlaxoSmithKline    Dose: 0.5 ml    Route: IM    Given by: Jennet Maduro RN    Exp. Date: 10/20/2010    Lot #: [id number]  Flu Vaccine Consent Questions:    Do you have a history of severe allergic reactions to this vaccine? no    Any prior history of allergic reactions to egg and/or gelatin? no    Do you have a sensitivity to the preservative Thimersol? no    Do you have a past history of Guillan-Barre Syndrome? no    Do you currently have an acute febrile illness? no    Have you ever had a severe reaction to latex? no    Vaccine information given and explained to patient? yes   Immunizations Administered:  Influenza Vaccine # 1:    Vaccine Type: Fluvax Non-MCR    Site: left deltoid    Mfr: GlaxoSmithKline    Dose: 0.5 ml    Route: IM    Given by: Jennet Maduro RN    Exp. Date: 10/20/2010    Lot #: [id number]

## 2010-07-05 LAB — T-HELPER CELL (CD4) - (RCID CLINIC ONLY)
CD4 % Helper T Cell: 8 % — ABNORMAL LOW (ref 33–55)
CD4 T Cell Abs: 160 uL — ABNORMAL LOW (ref 400–2700)

## 2010-07-09 LAB — POCT I-STAT, CHEM 8
BUN: 24 mg/dL — ABNORMAL HIGH (ref 6–23)
Creatinine, Ser: 1.3 mg/dL (ref 0.4–1.5)
Potassium: 4.4 mEq/L (ref 3.5–5.1)
Sodium: 142 mEq/L (ref 135–145)

## 2010-07-09 LAB — CBC
HCT: 36.9 % — ABNORMAL LOW (ref 39.0–52.0)
Hemoglobin: 12.9 g/dL — ABNORMAL LOW (ref 13.0–17.0)
WBC: 3.3 10*3/uL — ABNORMAL LOW (ref 4.0–10.5)

## 2010-07-11 LAB — T-HELPER CELL (CD4) - (RCID CLINIC ONLY): CD4 % Helper T Cell: 18 % — ABNORMAL LOW (ref 33–55)

## 2010-07-26 LAB — T-HELPER CELL (CD4) - (RCID CLINIC ONLY): CD4 T Cell Abs: 200 uL — ABNORMAL LOW (ref 400–2700)

## 2010-07-27 LAB — T-HELPER CELL (CD4) - (RCID CLINIC ONLY)
CD4 % Helper T Cell: 13 % — ABNORMAL LOW (ref 33–55)
CD4 T Cell Abs: 180 uL — ABNORMAL LOW (ref 400–2700)

## 2010-08-20 ENCOUNTER — Other Ambulatory Visit: Payer: Self-pay | Admitting: Licensed Clinical Social Worker

## 2010-08-20 DIAGNOSIS — G40909 Epilepsy, unspecified, not intractable, without status epilepticus: Secondary | ICD-10-CM

## 2010-08-20 MED ORDER — CARBAMAZEPINE 200 MG PO TABS: 200.0000 mg | ORAL_TABLET | Freq: Three times a day (TID) | ORAL | Status: DC

## 2010-09-07 NOTE — H&P (Signed)
[facility]  Patient:    Roger Walton, Roger Walton Visit Number: [id number] MRN: [id number]          Service Type: EMS Location: [facility] Attending Physician:  Roger Walton Dictated by:   Roger Walton, M.D. Admit Date:  02/14/2001 Discharge Date: 02/15/2001   CC:         [employer] Roger Walton, M.D.   History and Physical  DATE OF BIRTH:  [birth year]  PROBLEM LIST: 1. Left deep vein thrombosis, rule out pulmonary embolus.    a. CT scan of the chest and lower extremities consistent with an acute       left deep vein thrombosis in the popliteal veins, no pulmonary emboli,       mild bilateral hilar adenopathy, bifascicular noncalcified nodules up       to 6 mm, probably nonspecific. 2. Tobacco abuse, one pack per day x 30 years. 3. Normocytic anemia, hemoglobin 11.9, MCV 95. 4. Seizure disorder since childhood. 5. Hypoalbuminemia, albumin 3.1, negative proteinuria. 6. Left hydrocele.  CHIEF COMPLAINT:  Left leg pain, swelling, and dyspnea on exertion.  HISTORY OF PRESENT ILLNESS:  Roger Walton is a very pleasant 55 year old gentleman who presents with a left DVT.  The patient developed a severe left posterior thigh muscular spasm about six weeks ago that forced him to be bedridden for about three weeks afterwards.  About a week ago he started walking again, when he noticed swelling of the left leg.  He also describes dyspnea on exertion with long distances for the last week or so.  No chest pain, no hemoptysis, no cough.  The patient denies history malignancies, trauma, long trips, recent surgery, or previous DVT.  He describes some weight loss though he is not able to tell us how many pounds he may have lost over the last few months.  He denies night sweats, melena, tarry stools, bright red blood per rectum, syncope, headaches, skin rash, or urinary symptoms.  No diarrhea, constipation.  No nausea or vomiting.  No fever or  chills.  No rhinorrhea, no focal weakness.  No swallowing problems.  PAST MEDICAL HISTORY:  As in problem list.  ALLERGIES:  None.  MEDICATIONS: 1. Valproic acid 600 mg p.o. b.i.d. 2. Phenobarbital 16.2 mg tablet, 2 tablets q.h.s.  FAMILY HISTORY:  Negative for diabetes, hypertension, strokes, or malignancy. His uncle had an MI at age 65.  His mother died from alcohol abuse complications.  SOCIAL HISTORY:  The patient is single and has three grown daughters.  He smokes, as described in problem list.  He denies alcohol use.  He is unemployed.  REVIEW OF SYSTEMS:  As in HPI.  PHYSICAL EXAMINATION:  VITAL SIGNS:  Temperature 98.7, blood pressure 107/63, heart rate 84, respiratory rate 16, oxygen saturation 92-98% on room air.  HEENT:  Normocephalic, atraumatic.  Nonicteric sclerae.  Conjunctivae within normal limits.  PERRLA, EOMI.  Funduscopic exam negative for papilledema or hemorrhages.  Moist mucous membranes.  Multiple areas of dental decay. Oropharynx clear.  NECK:  Supple.  No JVD, no bruits, no lymphadenopathy, no thyromegaly.  LUNGS:  Clear to auscultation bilaterally.  Without crackles, wheezes.  Fair air movement bilaterally.  CARDIAC:  Regular rate and rhythm without murmurs, rubs, or gallops.  Normal S1, S2.  ABDOMEN:  Flat, nontender, nondistended.  Bowel sounds were present.  No hepatosplenomegaly.  No rebound, no guarding, no masses, no bruits.  GENITOURINARY:  Within the normal limits except for a left hydrocele.  RECTAL:  Empty vault.  Normal sphincter tone.  Prostate within the normal limits.  Nontender.  No nodules.  EXTREMITIES:  Left lower extremity 1+ pitting edema up to the mid tibial shaft.  Slightly warmer on the left compared to the right.  Distal ankle tenderness.  Pulses 2+ bilaterally.  No clubbing, no cyanosis.  NEUROLOGIC:  Alert and oriented x 3.  Strength 5/5 in all extremities.  DTRs 3/5 in all extremities.  Cranial nerves II-XII  intact.  Sensorium intact. Plantar reflexes downgoing bilaterally.  LABORATORY DATA:  CT scan, as described in problem list.  Hemoglobin 11.9, MCV 95, WBC 5.1, platelets 251.  Troponin I 0.01, CK 283, CK-MB 1.3.  Sodium 139, potassium 4.2, chloride 108, CO2 26, BUN 12, creatinine 0.1, glucose 120.  LFTs within the normal limits.  Albumin 3.1, total protein 6.1.  Negative urinalysis.  ASSESSMENT AND PLAN: 1. Left deep vein thrombosis, rule out pulmonary embolus:  The patient    presents with an acute left deep vein thrombosis.  The CT scan showed no    evidence of pulmonary emboli.  There is no evidence of hypoxia or    tachypnea.  The patient has been bedridden pretty much for three weeks, and    it seems to be the most likely reason for this deep vein thrombosis.  Also    it is important to keep in mind that the patient describes weight loss.  He    also has hypoalbuminemia along with normocytic anemia and an abnormal CT    scan of the chest.  An underlying malignancy needs to be considered.  The    patient is being admitted to the hospital to start Lovenox and Coumadin.    The patient does not have a primary care Roger Walton, and arrangements to    follow this medical problem as an outpatient cannot be taken care of at    this time of the night and on the weekend.  Roger Walton is hemodynamically    stable, and there is no evidence of hypoxia.  During this hospitalization,    will be considering if a further workup for PE is necessary with a V/Q    scan.  Otherwise, further workup for malignancy should be pursued.  A CT    scan of the chest will be repeated within one to three months as    recommended by the radiologist.  Findings consistent with mild bilateral    hilar adenopathy and nonspecific bifascicular noncalcified nodules (up to    6 mm in size), may now have signs of malignancy.  These findings will be     followed, as mentioned before, with a chest CT scan within one to three     months.  Anemia studies will be started during this hospitalization, and a    colonoscopy should be obtained as an outpatient. 2. Tobacco abuse:  I spent about 15 minutes in discussion about the importance    of smoking cessation.  Will start nicotine patch to help with the symptoms. 3. Normocytic anemia:  As described previously, anemia studies will be    obtained today.  The patient denies any symptoms of GI bleed.  Valproic    acid can be associated with aplastic anemia as well as pancytopenia.  The    patient denies NSAID use, peptic ulcer disease, or previous history of GI    bleed.  Will be following the patients hemoglobin within 24 hours, along    with anemia  studies.  A colonoscopy will be arranged as an outpatient. 4. Seizure disorder:  This problem seems to be stable.  The last seizure    activity took place about a year ago.  Will continue both medications for    now.  There is no clinical evidence of toxicity with either of these two    medications. 5. Hypoalbuminemia and weight loss:  As described above, a colonoscopy should    be arranged as an outpatient.  A CT scan of the chest will be repeated    within one to three months.  The patient denies any symptoms of diarrhea    or malabsorption.  The urinalysis showed no proteinuria.  Will be following    this problem with laboratories either during this hospital stay or as an    outpatient. Dictated by:   Roger Walton, M.D. Attending Physician:  Roger Walton DD:  03/15/01 TD:  03/15/01 Job: [id number]

## 2010-09-07 NOTE — Discharge Summary (Signed)
[facility]  Patient:    Roger Walton, Roger Walton Visit Number: [id number] MRN: [id number]          Service Type: MED Location: [id number] Attending Physician:  Roger Walton Dictated by:   Roger Walton, M.D. Admit Date:  03/14/2001 Discharge Date: 03/18/2001   CC:         Roger Walton. Roger Walton, M.D.  Roger Walton, M.D.   Discharge Summary  DATE OF BIRTH:  [birth year]  DISCHARGE DIAGNOSES: 1. Acute left popliteal vein deep venous thrombosis.    a. No evidence of pulmonary embolus by chest computed tomography.    b. Hypercoagulability studies in progress. 2. Questionable left leg injury three weeks ago, question predisposing or    possibly the clot forming initially. 3. Normocytic anemia, resolved.    a. No evidence of iron deficiency.    b. Guaiac negative. 4. Abnormal chest computed tomography.    a. Mild bilateral hilar adenopathy with basilar nonspecific small nodules,       question granulomatous process.    b. Planned followup chest computed tomography in two to three months.    c. Purified protein derivative placed and to be read Friday, November 29,       by home health nurse. 5. Seizure disorder, last seizure greater than one year ago. 6. Tobacco abuse with a 30-pack-year history. 7. Hypoalbuminemia at 3.1. 8. Left hydrocele.  DISCHARGE MEDICATIONS: 1. Coumadin 5 mg q.d. (first dose on Thursday with further instructions to be    given by Dr. Audria Walton). 2. Phenobarbital 64.8 mg two pills q.h.s. 3. Tegretol 200 mg three pills at 8 a.m. and three pills at 8 p.m. ** It is okay to take one to two Tylenol each day as needed for pain. ** Avoid aspirin and ibuprofen containing products.  CONDITION ON DISCHARGE:  Stable.  The patient is ambulating without difficulty and not having any significant pain at this time.  ACTIVITY:  No contact sports or rough activity, otherwise as tolerated starting on Monday.  Stay home until that time.  DIET:   As tolerated.  SPECIAL INSTRUCTIONS:  Call [facility] or return if you have bleeding problems or questions.  Do not smoke.  Do not drink alcohol.  FOLLOWUP:  Home health will draw blood on Friday to check a PT.  The results will be called to Dr. Audria Walton.  She will give him further instructions.  I told him to contact her if she had not called him with those instructions. The patient is to go to [facility] on Monday, December 2, at 9 a.m. to have a PT checked as well as a Tegretol and phenobarbital level and also to see Dr. Audria Walton.  [name] with [facility], but has not seen any one specific doctor yet.  CONSULTATIONS:  None.  PROCEDURES: 1. Electrocardiogram with normal sinus rhythm, minimal voltage criteria for    left ventricular hypertrophy, but may be normal variant. 2. Chest computed tomography with pulmonary embolus protocol on November 24,    with several small bibasilar pulmonary nodules too small to characterize,    but noncalcified.  They could represent noncalcified granulomata or    potential fungal disease given the mild bilateral hilar adenopathy.    Radiology suggests either followup chest x-ray or computed tomography in    order to exclude any enlargement or any other evidence of progression or    malignancy.  Bibasilar segmental atelectasis with no pulmonary embolus    demonstrated.  3. Lower extremity computed tomography reveals acute left popliteal deep    venous thrombosis, left hydrocele, small knee effusions bilaterally. 4. Computed tomography of the abdomen and pelvis with contrast on November 25.    No significant abnormality seen within the pelvis.  There is a large amount    of fecal material associated with the sigmoid and left colon.  HOSPITAL COURSE:  #1 - ACUTE LEFT LOWER EXTREMITY DEEP VENOUS THROMBOSIS: Roger Walton, African-American gentleman with a history of seizure disorder which has been  well-controlled by Roger Walton.  He also smokes cigarettes.  Approximately six weeks prior to admission, he had severe left posterior thigh muscle spasm followed by three weeks of decreased mobility.  Approximately one week ago when he started to get up and move around, he noticed swelling in the leg.  He had some minimal complaints of dyspnea on exertion.  Spiral chest CT was done which demonstrated no PE, but he does have some changes (discussed below).  He was admitted to the hospital and placed on Lovenox and Coumadin was started.  Roger Walton chest CT scan was abnormal, but certainly not suggestive of metastatic disease or cancer. Because of his age and his mild anemia and low albumin, we felt we needed to go looking further for underlying malignancy.  Abdominal and pelvic CT were done which showed no significant abnormalities to suggest this.  At the time of discharge, a PSA as well as a factor V Leiden gene mutation and a prothrombin gene mutation were pending.  History would suggest that he had this spasm and then was inactive, but is difficult to know whether or not the beginnings of a clot could cause symptoms of pain resulting in immobility. Regardless, he will be treated for at least three to six months of therapy and then depending on these followup studies, we can consider more or further workup.  Fortunately, Roger Walton had established Walton with [facility] and Roger Walton was kind enough to follow him over the next several days and to help arrange his Coumadin as an outpatient.  I have told him to stay at home for the next few days and not to drive.  Coumadin can interact with other medications and he is on two seizure medications which have controlled his seizures quite well and I just want to make sure the levels have stayed therapeutic with the addition of Coumadin.  He received Coumadin  education here in the hospital and this should continue as an  outpatient.  #2 - TOBACCO ABUSE:  He was counseled on cessation.  He declined the nicotine patch at discharge.  #3 - NORMOCYTIC ANEMIA:  Roger Walton had an admission hemoglobin of 11.9 with an MCV of 95.  It had come up to above 12 at the time of discharge.  He was guaiac negative and iron studies were done which were normal.  At this point, I would continue to guaiac his stools and follow his hemoglobin.  In terms of underlying malignancy workup, if any of these turn positive, he may need a colonoscopy.  Again, the abdominal and pelvic CT was negative.  #4 - SEIZURE DISORDER:  He will continue current medications as they are. Follow up on blood levels.  DISCHARGE LABORATORY DATA AND X-RAY FINDINGS:  INR 2.6.  Hemoglobin 12.6, MCV 95, WBC 3900, platelet count 242.  Guaiac negative x 2.  Sodium 137, potassium 4.3, chloride 105, bicarb 28, glucose 89, BUN 13, creatinine 1.0, calcium  8.2. Total protein 6.1, albumin 3.1, SGOT 25, SGPT 16, Alk phos 66, total bilirubin 0.5.  Homocystine was slightly elevated at 16.68 (normal 5-13.9).  Iron 94, TIBC 233, percent saturation 40.  Ferritin level 136.  PSA 0.58. Phenobarbital 21.7 (I mistakenly ordered Dilantin level instead of Tegretol level).  Follow up as an outpatient.  Factor V Leiden gene mutation pending. In the chart, I cannot see where a prothrombin gene mutation was drawn.  This will need to be done as well.  **With the mildly elevated homocystine, I would start the patient on folate as an outpatient.   Dictated by:   Roger Walton, M.D. Attending Physician:  Roger Walton DD:  03/19/01 TD:  03/20/01 Job: [id number]

## 2010-09-07 NOTE — Discharge Summary (Signed)
NAMEDASAN, Roger Walton                 ACCOUNT NO.:  [id number]   MEDICAL RECORD NO.:  [id number]          PATIENT TYPE:  INP   LOCATION:  4702                         FACILITY:  [facility]   PHYSICIAN:  Roger Walton, M.D.DATE OF BIRTH:  03-Dec-1955   DATE OF ADMISSION:  03/26/2004  DATE OF DISCHARGE:  03/27/2004                                 DISCHARGE SUMMARY   CONSULTATIONS:  Roger Walton, M.D., Infectious diseases.   DISCHARGE DIAGNOSES:  1.  INH hepatotoxicity.  2.  Acute hepatic encephalopathy.  3.  Human immunodeficiency virus positive.  4.  PPD positive.  5.  Tobacco abuse.   DISCHARGE MEDICATIONS:  1.  Phenobarbital 60 mg p.o. at bedtime.  2.  Carbamazepine 200 mg 1 tab p.o. t.i.d.   PROCEDURES:  None.   CHIEF COMPLAINT:  Dizzy and weak.   HISTORY OF PRESENT ILLNESS:  This is a 55 year old African American male  with a past medical history significant for HIV and PPD positive and seizure  disorder, who presented with three days of worsening fatigue, dizziness and  weakness.  The patient was in his usual state of health when he visited the  health department for HIV testing.  The test was positive and he was told  that he had a positive PPD test.  PPD was read as positive at 18 mm in  duration.  He had a chest x-ray which was read as negative.  The patient was  started on INH and B6 on March 18, 2004.  The patient felt like all his  symptoms started as a result of starting his INH.  He had nausea but no  emesis, just dry heaves.  He had been so weak that when he sat up he felt  dizzy.  He had poor intake for three days with little food or H2O.  He  denied any fevers or chills, some night sweats for one week, no abdominal  pain, no change in stools, no dysuria or hematuria, just dark urine, no  focal weakness.  He has no history of Tylenol use or a history of hepatitis,  or a history of alcohol use, or any family history of liver disease.   ALLERGIES:  NO KNOWN DRUG  ALLERGIES.   PAST MEDICAL HISTORY:  1.  HIV, last known CD4 count was 336 on February 21, 2004.  2.  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  The patient currently on INH for one week.  3.  Seizure disorder.   MEDICATIONS:  1.  INH.  2.  Phenobarbital 60 mg p.o. at bedtime.  3.  Carbamazepine 200 mg 2 tabs p.o. t.i.d.   SUBSTANCE ABUSE HISTORY:  He is a current smoker.  He has smoked one pack  per day for 30 years.  The patient occasionally drinks alcohol.  No cocaine,  no IV drugs.   SOCIAL HISTORY:  The patient is single.  He works at a [employer].  He lives with his fiancee and her child.   FAMILY HISTORY:  Mother died of alcohol abuse at 44.  Father died at 29 of  unknown  causes.  The patient has three daughter, one of whom has epilepsy.   PHYSICAL EXAMINATION ON ADMISSION:  VITAL SIGNS:  Pulse 68, blood pressure  107/67, temperature 97.2, respirations 20, O2 saturations 98% on room air.  GENERAL:  The patient was a thin African American male in no acute distress.  EYES:  The patient had bilateral scleral icterus.  GASTROINTESTINAL:  The patient was not found to have hepatosplenomegaly.  NEUROLOGIC:  The patient did not have asterixis.   The rest of the physical exam was benign.   LABORATORIES ON ADMISSION:  Complete metabolic panel:  Sodium 132, potassium  4.5, chloride 101, bicarbonate 26, BUN 17, creatinine 1.2, glucose 130.  Bilirubin 0.5, alkaline phosphatase 99, AST 819, ALT 668.  Protein 7.8.  CBC:  White blood cells 2.5, hemoglobin and hematocrit 16.8 and 49.5,  respectively, platelets 192,000.  Urinalysis:  Specific gravity 1.034, pH  5.0, urine glucose negative, bilirubin small, ketones negative, blood  negative, protein 30, uro 1.0, nitrites negative, leukocyte esterase trace.  Urine drug screen was negative.  Chest x-ray showed COPD.  Chest x-ray also  showed basilar linear atelectasis.  CT of the head was negative for  intracranial bleeds.  Phenobarbital level 25.4.   Tegretol level 14.2.  PTT  33, PT 14, INR 1.1.  HIV Western blot was positive.  HIA antibody test was  reactive.  CD4 helper count was 310.  T-helper was 190.  TSH was 0.064, T4  was 8.4.  Acetaminophen level was less than 10.   HOSPITAL COURSE:  #1.  INH hepatotoxicity.  On admission, the patient's INH  was held.  The patient was quite encephalopathic the night of admission.  His sensorium improved throughout the following morning up until discharge.  His AST levels and ALT levels dropped substantially from stopping his INH  just one day.  The patient was discharged the day after admission with  followup appointment in two days at the outpatient clinic to recheck his  complete metabolic panel.   #2.  Supertherapeutic Tegretol level.  The patient's serum Tegretol level  was elevated due to impaired liver function.  At discharge, the patient was  told to cut his daily dose of Tegretol in half.  He agreed to this decrease  in Tegretol, and also to an appointment in two days to recheck his serum  Tegretol level.   #3.  Human immunodeficiency virus.  Two months ago the health department  told Roger Walton that he was HIV positive.  He did not believe their  conclusion.  He requested repeat testing during his hospital stay.  He  tested positive once again.  Dr. Orvan Walton consulted on Roger Walton in the  hospital during his stay.  In the hospital, his CD4 count was 310 and his T-  helper C-count was 190.  His last known CD4 count was 336 on January 21, 2004.  The patient was not started on antiretroviral therapy on discharge  because of his acute illness.  He will follow up in the inpatient medicine  clinic regarding possible HAART therapy.   #4.  Positive PPD.  Roger Walton admission chest x-ray was negative for signs  of tuberculosis.  He will follow up in the inpatient medicine clinic  regarding possible alternative therapy other than INH for his positive PPD  status.  #5.  Abnormal thyroid  laboratory.  During the hospital stay, the patient was  found to have an abnormally low TSH level of 0.064.  The  patient will follow  up in the inpatient medicine clinic regarding this abnormal lab and pending  thyroid panel.   #6.  Tobacco abuse.  While in the hospital, the patient was advised several  times to quit smoking.   DISCHARGE LABORATORIES:  1.  Complete metabolic panel:  Sodium 135, potassium 3.5, chloride 105,      bicarbonate 27, BUN 15, creatinine 1.0, glucose 95.  Bilirubin 0.7,      alkaline phosphatase 81, AST 470, ALT 538, protein 6.1, albumin 2.6,      calcium 7.7.  2.  CBC:  White blood cells 3.6, hemoglobin and hematocrit      14.5 and 42.3, respectively, platelets 179,000.  CMV antibodies 0.05,      IgM 143.       IO/MEDQ  D:  04/01/2004  T:  04/01/2004  Job:  [id number]   cc:   Reggie Pile, MD  Fax: [phone]

## 2010-10-19 ENCOUNTER — Other Ambulatory Visit: Payer: Self-pay | Admitting: Infectious Diseases

## 2010-12-06 ENCOUNTER — Inpatient Hospital Stay (INDEPENDENT_AMBULATORY_CARE_PROVIDER_SITE_OTHER)
Admission: RE | Admit: 2010-12-06 | Discharge: 2010-12-06 | Disposition: A | Discharge status: Home / Self Care | Payer: Medicare Other | Source: Ambulatory Visit | Attending: Emergency Medicine | Admitting: Emergency Medicine

## 2010-12-06 DIAGNOSIS — L723 Sebaceous cyst: Secondary | ICD-10-CM

## 2011-01-07 ENCOUNTER — Ambulatory Visit (INDEPENDENT_AMBULATORY_CARE_PROVIDER_SITE_OTHER): Payer: Medicare Other

## 2011-01-07 ENCOUNTER — Other Ambulatory Visit (INDEPENDENT_AMBULATORY_CARE_PROVIDER_SITE_OTHER): Payer: Medicare Other

## 2011-01-07 DIAGNOSIS — B20 Human immunodeficiency virus [HIV] disease: Secondary | ICD-10-CM

## 2011-01-07 DIAGNOSIS — Z79899 Other long term (current) drug therapy: Secondary | ICD-10-CM

## 2011-01-07 DIAGNOSIS — Z113 Encounter for screening for infections with a predominantly sexual mode of transmission: Secondary | ICD-10-CM

## 2011-01-07 DIAGNOSIS — Z23 Encounter for immunization: Secondary | ICD-10-CM

## 2011-01-07 LAB — COMPLETE METABOLIC PANEL WITH GFR
ALT: 19 U/L (ref 0–53)
AST: 30 U/L (ref 0–37)
Albumin: 3.8 g/dL (ref 3.5–5.2)
Alkaline Phosphatase: 82 U/L (ref 39–117)
GFR, Est Non African American: >60 mL/min (ref >60)
Glucose, Bld: 88 mg/dL (ref 70–99)
Potassium: 4.5 mEq/L (ref 3.5–5.3)
Sodium: 141 mEq/L (ref 135–145)
Total Protein: 7.1 g/dL (ref 6.0–8.3)

## 2011-01-07 LAB — CBC WITH DIFFERENTIAL/PLATELET
Basophils Absolute: 0 10*3/uL (ref 0.0–0.1)
Basophils Relative: 1 % (ref 0–1)
Eosinophils Absolute: 0.1 10*3/uL (ref 0.0–0.7)
Eosinophils Relative: 2 % (ref 0–5)
HCT: 39.4 % (ref 39.0–52.0)
MCH: 34.4 pg — ABNORMAL HIGH (ref 26.0–34.0)
MCHC: 35.3 g/dL (ref 30.0–36.0)
MCV: 97.5 fL (ref 78.0–100.0)
Monocytes Absolute: 0.3 10*3/uL (ref 0.1–1.0)
Monocytes Relative: 12 % (ref 3–12)
RDW: 14 % (ref 11.5–15.5)

## 2011-01-07 LAB — URINALYSIS, ROUTINE W REFLEX MICROSCOPIC
Bilirubin Urine: NEGATIVE
Leukocytes, UA: NEGATIVE
Specific Gravity, Urine: 1.029 (ref 1.005–1.030)
Urobilinogen, UA: 1 mg/dL (ref 0.0–1.0)

## 2011-01-07 LAB — LIPID PANEL
HDL: 33 mg/dL — ABNORMAL LOW (ref >39)
Total CHOL/HDL Ratio: 4.2 Ratio
VLDL: 8 mg/dL (ref 0–40)

## 2011-01-08 LAB — T-HELPER CELL (CD4) - (RCID CLINIC ONLY)
CD4 % Helper T Cell: 14 % — ABNORMAL LOW (ref 33–55)
CD4 T Cell Abs: 190 uL — ABNORMAL LOW (ref 400–2700)

## 2011-01-08 LAB — HIV-1 RNA QUANT-NO REFLEX-BLD: HIV 1 RNA Quant: 25 copies/mL — ABNORMAL HIGH (ref <20)

## 2011-01-21 LAB — CULTURE, ROUTINE-ABSCESS

## 2011-01-21 LAB — POCT URINALYSIS DIP (DEVICE)
Glucose, UA: NEGATIVE
Hgb urine dipstick: NEGATIVE
Nitrite: NEGATIVE
pH: 6.5

## 2011-01-22 LAB — T-HELPER CELL (CD4) - (RCID CLINIC ONLY)
CD4 % Helper T Cell: 18 — ABNORMAL LOW
CD4 T Cell Abs: 260 — ABNORMAL LOW

## 2011-01-31 LAB — T-HELPER CELL (CD4) - (RCID CLINIC ONLY): CD4 % Helper T Cell: 21 — ABNORMAL LOW

## 2011-02-01 LAB — CULTURE, ROUTINE-ABSCESS: Gram Stain: NONE SEEN

## 2011-02-20 ENCOUNTER — Encounter: Payer: Self-pay | Admitting: Infectious Diseases

## 2011-02-20 ENCOUNTER — Ambulatory Visit (INDEPENDENT_AMBULATORY_CARE_PROVIDER_SITE_OTHER): Payer: Medicare Other | Admitting: Infectious Diseases

## 2011-02-20 DIAGNOSIS — R569 Unspecified convulsions: Secondary | ICD-10-CM

## 2011-02-20 DIAGNOSIS — B20 Human immunodeficiency virus [HIV] disease: Secondary | ICD-10-CM

## 2011-02-20 DIAGNOSIS — Z113 Encounter for screening for infections with a predominantly sexual mode of transmission: Secondary | ICD-10-CM

## 2011-02-20 DIAGNOSIS — Z79899 Other long term (current) drug therapy: Secondary | ICD-10-CM

## 2011-02-20 DIAGNOSIS — F17200 Nicotine dependence, unspecified, uncomplicated: Secondary | ICD-10-CM

## 2011-02-20 DIAGNOSIS — Z23 Encounter for immunization: Secondary | ICD-10-CM

## 2011-02-20 NOTE — Assessment & Plan Note (Signed)
He is going to /fu with Neuro today. Will ask them to f/u regarding his drivers liscense application. He has been seizure free.

## 2011-02-20 NOTE — Progress Notes (Signed)
  Subjective:    Patient ID: Roger Walton, male    DOB: 06/20/1955, 55 y.o.   MRN: [id number]  HPI 55 yo M with HIV+ who has been maintained on [place].  Last CD4 190, VL 25 (01-07-11). Has previous genotype showing background PI mutations Has quit smoking. No seizures for some time, on same rx's.     Review of Systems  Constitutional: Negative for fever, chills, appetite change and unexpected weight change.  Respiratory: Negative for shortness of breath.   Gastrointestinal: Negative for diarrhea and constipation.  Genitourinary: Negative for dysuria.       Objective:   Physical Exam  Constitutional: He appears well-developed and well-nourished.  Eyes: EOM are normal. Pupils are equal, round, and reactive to light.  Neck: Neck supple.  Cardiovascular: Normal rate, regular rhythm and normal heart sounds.   Pulmonary/Chest: Effort normal and breath sounds normal. He has no wheezes. He has no rales.  Abdominal: Soft. Bowel sounds are normal. He exhibits no distension. There is no tenderness.  Lymphadenopathy:    He has no cervical adenopathy.          Assessment & Plan:

## 2011-02-20 NOTE — Assessment & Plan Note (Signed)
He has quit, very encouraged by this.

## 2011-02-20 NOTE — Progress Notes (Signed)
Addended by: Wendall Mola A on: 02/20/2011 02:21 PM   Modules accepted: Orders

## 2011-02-20 NOTE — Assessment & Plan Note (Signed)
He is doing well, has incomplete immune reconstitution. Will update his PNVX. Given condoms. Will see him back in 5-6 months with labs prior.

## 2011-03-04 ENCOUNTER — Other Ambulatory Visit: Payer: Self-pay | Admitting: Licensed Clinical Social Worker

## 2011-03-04 DIAGNOSIS — B20 Human immunodeficiency virus [HIV] disease: Secondary | ICD-10-CM

## 2011-03-04 MED ORDER — ENSURE PO LIQD: 1.0000 | Freq: Two times a day (BID) | ORAL | Status: DC

## 2011-03-08 ENCOUNTER — Other Ambulatory Visit: Payer: Self-pay | Admitting: Infectious Diseases

## 2011-03-08 DIAGNOSIS — B20 Human immunodeficiency virus [HIV] disease: Secondary | ICD-10-CM

## 2011-03-22 ENCOUNTER — Other Ambulatory Visit: Payer: Self-pay | Admitting: Infectious Diseases

## 2011-03-22 DIAGNOSIS — R569 Unspecified convulsions: Secondary | ICD-10-CM

## 2011-03-30 ENCOUNTER — Other Ambulatory Visit: Payer: Self-pay | Admitting: Infectious Diseases

## 2011-03-30 DIAGNOSIS — G40909 Epilepsy, unspecified, not intractable, without status epilepticus: Secondary | ICD-10-CM

## 2011-06-14 ENCOUNTER — Telehealth: Payer: Self-pay | Admitting: *Deleted

## 2011-06-14 ENCOUNTER — Other Ambulatory Visit: Payer: Self-pay | Admitting: *Deleted

## 2011-06-14 DIAGNOSIS — B20 Human immunodeficiency virus [HIV] disease: Secondary | ICD-10-CM

## 2011-06-14 MED ORDER — ENSURE PO LIQD: 1.0000 | Freq: Two times a day (BID) | ORAL | Status: DC

## 2011-06-14 NOTE — Telephone Encounter (Signed)
Patient called requesting refill on Ensure, as his BMI is on the low side at 22, a script was printed and given to [facility]. Wendall Mola CMA

## 2011-08-05 ENCOUNTER — Other Ambulatory Visit: Payer: Medicare Other

## 2011-08-05 DIAGNOSIS — Z113 Encounter for screening for infections with a predominantly sexual mode of transmission: Secondary | ICD-10-CM

## 2011-08-05 DIAGNOSIS — B20 Human immunodeficiency virus [HIV] disease: Secondary | ICD-10-CM

## 2011-08-05 DIAGNOSIS — Z79899 Other long term (current) drug therapy: Secondary | ICD-10-CM

## 2011-08-05 LAB — COMPREHENSIVE METABOLIC PANEL WITH GFR
ALT: 17 U/L (ref 0–53)
AST: 26 U/L (ref 0–37)
Albumin: 3.9 g/dL (ref 3.5–5.2)
Alkaline Phosphatase: 86 U/L (ref 39–117)
BUN: 12 mg/dL (ref 6–23)
CO2: 29 meq/L (ref 19–32)
Calcium: 8.6 mg/dL (ref 8.4–10.5)
Chloride: 106 meq/L (ref 96–112)
Creat: 1.06 mg/dL (ref 0.50–1.35)
Glucose, Bld: 73 mg/dL (ref 70–99)
Potassium: 4.4 meq/L (ref 3.5–5.3)
Sodium: 140 meq/L (ref 135–145)
Total Bilirubin: 0.5 mg/dL (ref 0.3–1.2)
Total Protein: 7.6 g/dL (ref 6.0–8.3)

## 2011-08-05 LAB — LIPID PANEL
Cholesterol: 148 mg/dL (ref 0–200)
HDL: 37 mg/dL — ABNORMAL LOW (ref >39)
LDL Cholesterol: 92 mg/dL (ref 0–99)
Total CHOL/HDL Ratio: 4 Ratio
Triglycerides: 95 mg/dL (ref <150)
VLDL: 19 mg/dL (ref 0–40)

## 2011-08-05 LAB — CBC
HCT: 40.8 % (ref 39.0–52.0)
MCH: 34.3 pg — ABNORMAL HIGH (ref 26.0–34.0)
MCV: 98.6 fL (ref 78.0–100.0)
Platelets: 235 10*3/uL (ref 150–400)
RDW: 14.8 % (ref 11.5–15.5)

## 2011-08-05 LAB — RPR

## 2011-08-06 LAB — T-HELPER CELL (CD4) - (RCID CLINIC ONLY): CD4 T Cell Abs: 160 uL — ABNORMAL LOW (ref 400–2700)

## 2011-08-19 ENCOUNTER — Encounter: Payer: Self-pay | Admitting: Infectious Diseases

## 2011-08-19 ENCOUNTER — Ambulatory Visit (INDEPENDENT_AMBULATORY_CARE_PROVIDER_SITE_OTHER): Payer: Medicare Other | Admitting: Infectious Diseases

## 2011-08-19 VITALS — BP 137/88 | HR 81 | Temp 98.1°F | Ht 70.0 in | Wt 162.8 lb

## 2011-08-19 DIAGNOSIS — B20 Human immunodeficiency virus [HIV] disease: Secondary | ICD-10-CM

## 2011-08-19 DIAGNOSIS — R569 Unspecified convulsions: Secondary | ICD-10-CM

## 2011-08-19 MED ORDER — SULFAMETHOXAZOLE-TRIMETHOPRIM 400-80 MG PO TABS: 1.0000 | ORAL_TABLET | ORAL | Status: AC

## 2011-08-19 NOTE — Assessment & Plan Note (Signed)
He will pay off his debt to neurology and have f/u EEG. Appears to be doing well with current meds.

## 2011-08-19 NOTE — Progress Notes (Signed)
  Subjective:    Patient ID: Roger Walton, male    DOB: 1955/09/27, 56 y.o.   MRN: [id number]  HPI 56 yo M with HIV+ who has been maintained on [place].  Has previous genotype showing background PI mutations  No seizures for some time, on same rx's. Recent episode of dizziness and drowsiness from taking rx without food.  Has abrasion on his R knee from falling off bus.   HIV 1 RNA Quant (copies/mL)  Date Value  08/05/2011 39*  01/07/2011 25*  01/04/2010 7980*     CD4 T Cell Abs (cmm)  Date Value  08/05/2011 160*  01/07/2011 190*  01/04/2010 160*   Developed facial rash with drinking milk. Has switched back to whole/2%.     Review of Systems  Constitutional: Negative for appetite change and unexpected weight change.  Gastrointestinal: Negative for diarrhea and constipation.  Genitourinary: Negative for dysuria.       Objective:   Physical Exam  Constitutional: He appears well-developed and well-nourished.  HENT:  Head: Normocephalic and atraumatic.  Mouth/Throat: No oropharyngeal exudate.  Eyes: EOM are normal. Pupils are equal, round, and reactive to light.  Neck: Neck supple.  Cardiovascular: Normal rate, regular rhythm and normal heart sounds.   Pulmonary/Chest: Effort normal and breath sounds normal.  Abdominal: Soft. Bowel sounds are normal.  Lymphadenopathy:    He has no cervical adenopathy.          Assessment & Plan:

## 2011-08-19 NOTE — Assessment & Plan Note (Signed)
He is doing well, still with incomplete immune reconstitution. Will add bactrim tiw to his rx. He is given condoms. Vax up to date. Will see him back in 6 months with labs prior.

## 2011-08-29 ENCOUNTER — Other Ambulatory Visit: Payer: Self-pay | Admitting: Infectious Diseases

## 2011-09-03 ENCOUNTER — Other Ambulatory Visit: Payer: Self-pay | Admitting: Infectious Diseases

## 2011-09-04 ENCOUNTER — Other Ambulatory Visit: Payer: Self-pay | Admitting: *Deleted

## 2011-09-04 DIAGNOSIS — G40909 Epilepsy, unspecified, not intractable, without status epilepticus: Secondary | ICD-10-CM

## 2011-09-04 MED ORDER — PHENOBARBITAL 64.8 MG PO TABS: 64.8000 mg | ORAL_TABLET | Freq: Every day | ORAL | Status: DC

## 2011-10-28 ENCOUNTER — Other Ambulatory Visit: Payer: Self-pay | Admitting: Infectious Diseases

## 2011-10-31 ENCOUNTER — Other Ambulatory Visit: Payer: Self-pay | Admitting: Infectious Diseases

## 2011-11-25 ENCOUNTER — Other Ambulatory Visit: Payer: Self-pay | Admitting: *Deleted

## 2011-11-25 DIAGNOSIS — B20 Human immunodeficiency virus [HIV] disease: Secondary | ICD-10-CM

## 2011-11-25 MED ORDER — EFAVIRENZ-EMTRICITAB-TENOFOVIR 600-200-300 MG PO TABS: 1.0000 | ORAL_TABLET | Freq: Every day | ORAL | Status: DC

## 2012-01-28 ENCOUNTER — Other Ambulatory Visit: Payer: Self-pay | Admitting: Infectious Diseases

## 2012-02-05 ENCOUNTER — Other Ambulatory Visit: Payer: Medicare Other

## 2012-02-05 ENCOUNTER — Telehealth: Payer: Self-pay | Admitting: *Deleted

## 2012-02-05 NOTE — Telephone Encounter (Signed)
Called and left patient a voice mail to call the clinic to reschedule his lab appt, he no showed today. Jacqueline Cockerham  

## 2012-02-19 ENCOUNTER — Other Ambulatory Visit: Payer: Medicare Other

## 2012-02-19 ENCOUNTER — Ambulatory Visit: Payer: Medicare Other

## 2012-02-19 ENCOUNTER — Ambulatory Visit: Payer: Medicare Other | Admitting: Infectious Diseases

## 2012-02-19 DIAGNOSIS — B20 Human immunodeficiency virus [HIV] disease: Secondary | ICD-10-CM

## 2012-02-19 LAB — CBC
MCH: 33.7 pg (ref 26.0–34.0)
MCHC: 35.2 g/dL (ref 30.0–36.0)
MCV: 95.8 fL (ref 78.0–100.0)
Platelets: 263 10*3/uL (ref 150–400)
RBC: 4.03 MIL/uL — ABNORMAL LOW (ref 4.22–5.81)

## 2012-02-20 LAB — T-HELPER CELL (CD4) - (RCID CLINIC ONLY)
CD4 % Helper T Cell: 12 % — ABNORMAL LOW (ref 33–55)
CD4 T Cell Abs: 120 uL — ABNORMAL LOW (ref 400–2700)

## 2012-02-20 LAB — COMPREHENSIVE METABOLIC PANEL
ALT: 14 U/L (ref 0–53)
CO2: 27 mEq/L (ref 19–32)
Creat: 1.05 mg/dL (ref 0.50–1.35)
Total Bilirubin: 0.4 mg/dL (ref 0.3–1.2)

## 2012-03-04 ENCOUNTER — Ambulatory Visit (INDEPENDENT_AMBULATORY_CARE_PROVIDER_SITE_OTHER): Payer: Medicare Other | Admitting: Infectious Diseases

## 2012-03-04 ENCOUNTER — Encounter: Payer: Self-pay | Admitting: Infectious Diseases

## 2012-03-04 VITALS — BP 154/83 | HR 64 | Temp 97.7°F | Ht 69.0 in | Wt 154.8 lb

## 2012-03-04 DIAGNOSIS — Z113 Encounter for screening for infections with a predominantly sexual mode of transmission: Secondary | ICD-10-CM

## 2012-03-04 DIAGNOSIS — R569 Unspecified convulsions: Secondary | ICD-10-CM

## 2012-03-04 DIAGNOSIS — F17200 Nicotine dependence, unspecified, uncomplicated: Secondary | ICD-10-CM

## 2012-03-04 DIAGNOSIS — Z79899 Other long term (current) drug therapy: Secondary | ICD-10-CM

## 2012-03-04 DIAGNOSIS — B20 Human immunodeficiency virus [HIV] disease: Secondary | ICD-10-CM

## 2012-03-04 DIAGNOSIS — G40409 Other generalized epilepsy and epileptic syndromes, not intractable, without status epilepticus: Secondary | ICD-10-CM

## 2012-03-04 HISTORY — DX: Other generalized epilepsy and epileptic syndromes, not intractable, without status epilepticus: G40.409

## 2012-03-04 NOTE — Progress Notes (Signed)
  Subjective:    Patient ID: Roger Walton, male    DOB: Sep 21, 1955, 56 y.o.   MRN: [id number]  HPI 56 yo M with HIV+ who has been maintained on [place].  Has previous genotype showing background PI mutations. Also hx of seizure disorder.  Having upper back pain. Some relief with alka-seltzer cold+. Has cut back on his smoking- <1/4 ppd. No problems with his medications. Is going to be seen by ophtho for new glasses.  No seizures.  HIV 1 RNA Quant (copies/mL)  Date Value  02/19/2012 90*  08/05/2011 39*  01/07/2011 25*     CD4 T Cell Abs (cmm)  Date Value  02/19/2012 120*  08/05/2011 160*  01/07/2011 190*      Review of Systems  Constitutional: Negative for appetite change and unexpected weight change.  Cardiovascular: Negative for chest pain.  Gastrointestinal: Negative for diarrhea and constipation.  Genitourinary: Negative for difficulty urinating.  Neurological: Negative for headaches.       Objective:   Physical Exam  Constitutional: He appears well-developed and well-nourished.  HENT:  Mouth/Throat: No oropharyngeal exudate.  Eyes: EOM are normal. Pupils are equal, round, and reactive to light.  Neck: Neck supple.  Cardiovascular: Normal rate, regular rhythm and normal heart sounds.   Pulmonary/Chest: Effort normal and breath sounds normal.  Abdominal: Soft. Bowel sounds are normal. There is no tenderness.  Lymphadenopathy:    He has no cervical adenopathy.          Assessment & Plan:

## 2012-03-04 NOTE — Assessment & Plan Note (Signed)
Trying to quit, encouraged.

## 2012-03-04 NOTE — Assessment & Plan Note (Signed)
He is working on getting into neurology still. No change in his seizure medications (he has been seizure free).

## 2012-03-04 NOTE — Assessment & Plan Note (Signed)
He is doing ok- clinically doing very well. But he has not reconstituted, still has low level detectable virus. He has gotten flu shot and is given condoms today. Will see hm back in 6 months.

## 2012-03-05 ENCOUNTER — Telehealth: Payer: Self-pay | Admitting: *Deleted

## 2012-03-05 ENCOUNTER — Encounter: Payer: Self-pay | Admitting: Gastroenterology

## 2012-03-05 NOTE — Telephone Encounter (Signed)
Called patient and notified of appt with [facility] for colonoscopy. Initial nurse visit is 04/06/12 at 1:00 pm, and procedure is 04/20/12 at 10:30 AM with Dr. Jarold Motto. He was given address and phone number to the office. Wendall Mola CMA

## 2012-04-06 ENCOUNTER — Ambulatory Visit (AMBULATORY_SURGERY_CENTER): Payer: Medicare Other | Admitting: *Deleted

## 2012-04-06 VITALS — Ht 69.0 in | Wt 150.0 lb

## 2012-04-06 DIAGNOSIS — Z1211 Encounter for screening for malignant neoplasm of colon: Secondary | ICD-10-CM

## 2012-04-06 MED ORDER — MOVIPREP 100 G PO SOLR: ORAL | Status: DC

## 2012-04-20 ENCOUNTER — Ambulatory Visit (AMBULATORY_SURGERY_CENTER): Payer: Medicare Other | Admitting: Gastroenterology

## 2012-04-20 ENCOUNTER — Encounter: Payer: Self-pay | Admitting: Gastroenterology

## 2012-04-20 VITALS — BP 137/80 | HR 64 | Temp 97.0°F | Resp 16 | Ht 69.0 in | Wt 150.0 lb

## 2012-04-20 DIAGNOSIS — Z1211 Encounter for screening for malignant neoplasm of colon: Secondary | ICD-10-CM

## 2012-04-20 MED ORDER — SODIUM CHLORIDE 0.9 % IV SOLN: 500.0000 mL | INTRAVENOUS | Status: DC

## 2012-04-20 NOTE — Op Note (Signed)
[facility] [address] [place], [address]   COLONOSCOPY PROCEDURE REPORT  PATIENT: Roger, Walton  MR#: [id number] BIRTHDATE: 1955/10/24 , 56  yrs. old GENDER: Male ENDOSCOPIST: Mardella Layman, MD, [facility] REFERRED BY:  [name], M.D. PROCEDURE DATE:  04/20/2012 PROCEDURE:   Colonoscopy, screening ASA CLASS:   Class II .Marland Kitchenon Rx. for HIV INDICATIONS:Colorectal cancer screening. MEDICATIONS: propofol (Diprivan) 150mg  IV  DESCRIPTION OF PROCEDURE:   After the risks and benefits and of the procedure were explained, informed consent was obtained.  A digital rectal exam revealed no abnormalities of the rectum.    The endoscope was introduced through the anus and advanced to the cecum, which was identified by both the appendix and ileocecal valve .  The quality of the prep was poor, using MoviPrep .  The instrument was then slowly withdrawn as the colon was fully examined.     COLON FINDINGS: A normal appearing cecum, ileocecal valve, and appendiceal orifice were identified.  The ascending, hepatic flexure, transverse, splenic flexure, descending, sigmoid colon and rectum appeared unremarkable.  No polyps or cancers were seen. Retroflexed views revealed no abnormalities.     The scope was then withdrawn from the patient and the procedure completed.VERY POOR COLON PREP !!!!  COMPLICATIONS: There were no complications. ENDOSCOPIC IMPRESSION: Normal colon .Marland KitchenMarland KitchenPOOR PREP,THUS LIMITED EXAM  RECOMMENDATIONS: 1.  Continue current colorectal screening recommendations for "routine risk" patients with a repeat colonoscopy in 10 years. 2.  Continue current medications   REPEAT EXAM:  cc:  _______________________________ eSignedMardella Layman, MD, [facility] 04/20/2012 10:44 AM

## 2012-04-20 NOTE — Progress Notes (Signed)
Patient did not experience any of the following events: a burn prior to discharge; a fall within the facility; wrong site/side/patient/procedure/implant event; or a hospital transfer or hospital admission upon discharge from the facility. (G8907) Patient did not have preoperative order for IV antibiotic SSI prophylaxis. (G8918)  

## 2012-04-20 NOTE — Patient Instructions (Addendum)
Discharge instructions given with verbal understanding. Normal exam. Resume previous medications. YOU HAD AN ENDOSCOPIC PROCEDURE TODAY AT THE [facility]: Refer to the procedure report that was given to you for any specific questions about what was found during the examination.  If the procedure report does not answer your questions, please call your gastroenterologist to clarify.  If you requested that your care partner not be given the details of your procedure findings, then the procedure report has been included in a sealed envelope for you to review at your convenience later.  YOU SHOULD EXPECT: Some feelings of bloating in the abdomen. Passage of more gas than usual.  Walking can help get rid of the air that was put into your GI tract during the procedure and reduce the bloating. If you had a lower endoscopy (such as a colonoscopy or flexible sigmoidoscopy) you may notice spotting of blood in your stool or on the toilet paper. If you underwent a bowel prep for your procedure, then you may not have a normal bowel movement for a few days.  DIET: Your first meal following the procedure should be a light meal and then it is ok to progress to your normal diet.  A half-sandwich or bowl of soup is an example of a good first meal.  Heavy or fried foods are harder to digest and may make you feel nauseous or bloated.  Likewise meals heavy in dairy and vegetables can cause extra gas to form and this can also increase the bloating.  Drink plenty of fluids but you should avoid alcoholic beverages for 24 hours.  ACTIVITY: Your care partner should take you home directly after the procedure.  You should plan to take it easy, moving slowly for the rest of the day.  You can resume normal activity the day after the procedure however you should NOT DRIVE or use heavy machinery for 24 hours (because of the sedation medicines used during the test).    SYMPTOMS TO REPORT IMMEDIATELY: A gastroenterologist  can be reached at any hour.  During normal business hours, 8:30 AM to 5:00 PM Monday through Friday, call [phone].  After hours and on weekends, please call the GI answering service at [phone] who will take a message and have the physician on call contact you.   Following lower endoscopy (colonoscopy or flexible sigmoidoscopy):  Excessive amounts of blood in the stool  Significant tenderness or worsening of abdominal pains  Swelling of the abdomen that is new, acute  Fever of 100F or higher  FOLLOW UP: If any biopsies were taken you will be contacted by phone or by letter within the next 1-3 weeks.  Call your gastroenterologist if you have not heard about the biopsies in 3 weeks.  Our staff will call the home number listed on your records the next business day following your procedure to check on you and address any questions or concerns that you may have at that time regarding the information given to you following your procedure. This is a courtesy call and so if there is no answer at the home number and we have not heard from you through the emergency physician on call, we will assume that you have returned to your regular daily activities without incident.  SIGNATURES/CONFIDENTIALITY: You and/or your care partner have signed paperwork which will be entered into your electronic medical record.  These signatures attest to the fact that that the information above on your After Visit Summary has been reviewed   and is understood.  Full responsibility of the confidentiality of this discharge information lies with you and/or your care-partner. 

## 2012-04-21 ENCOUNTER — Telehealth: Payer: Self-pay

## 2012-04-21 NOTE — Telephone Encounter (Signed)
The telephone # [phone] said # not in service.  I looked up snap shot called hm # [phone].  The number rang a quick beeping sound.  I then called mobil # [phone] and the voice mail had not been set up.  Unable to leave a message. Maw

## 2012-05-16 ENCOUNTER — Encounter (HOSPITAL_COMMUNITY): Payer: Self-pay | Admitting: Cardiology

## 2012-05-16 ENCOUNTER — Emergency Department (HOSPITAL_COMMUNITY)
Admission: EM | Admit: 2012-05-16 | Discharge: 2012-05-16 | Disposition: A | Discharge status: Home / Self Care | Payer: Medicare Other | Attending: Emergency Medicine | Admitting: Emergency Medicine

## 2012-05-16 ENCOUNTER — Emergency Department (HOSPITAL_COMMUNITY): Payer: Medicare Other

## 2012-05-16 DIAGNOSIS — M549 Dorsalgia, unspecified: Secondary | ICD-10-CM | POA: Insufficient documentation

## 2012-05-16 DIAGNOSIS — R509 Fever, unspecified: Secondary | ICD-10-CM | POA: Insufficient documentation

## 2012-05-16 DIAGNOSIS — R209 Unspecified disturbances of skin sensation: Secondary | ICD-10-CM | POA: Insufficient documentation

## 2012-05-16 DIAGNOSIS — B349 Viral infection, unspecified: Secondary | ICD-10-CM

## 2012-05-16 DIAGNOSIS — Z79899 Other long term (current) drug therapy: Secondary | ICD-10-CM | POA: Insufficient documentation

## 2012-05-16 DIAGNOSIS — F17200 Nicotine dependence, unspecified, uncomplicated: Secondary | ICD-10-CM | POA: Insufficient documentation

## 2012-05-16 DIAGNOSIS — G40909 Epilepsy, unspecified, not intractable, without status epilepticus: Secondary | ICD-10-CM | POA: Insufficient documentation

## 2012-05-16 DIAGNOSIS — R05 Cough: Secondary | ICD-10-CM | POA: Insufficient documentation

## 2012-05-16 DIAGNOSIS — Z21 Asymptomatic human immunodeficiency virus [HIV] infection status: Secondary | ICD-10-CM | POA: Insufficient documentation

## 2012-05-16 DIAGNOSIS — R059 Cough, unspecified: Secondary | ICD-10-CM | POA: Insufficient documentation

## 2012-05-16 DIAGNOSIS — M7918 Myalgia, other site: Secondary | ICD-10-CM

## 2012-05-16 DIAGNOSIS — M25519 Pain in unspecified shoulder: Secondary | ICD-10-CM | POA: Insufficient documentation

## 2012-05-16 DIAGNOSIS — B9789 Other viral agents as the cause of diseases classified elsewhere: Secondary | ICD-10-CM | POA: Insufficient documentation

## 2012-05-16 LAB — CBC
Hemoglobin: 13.6 g/dL (ref 13.0–17.0)
MCH: 34.4 pg — ABNORMAL HIGH (ref 26.0–34.0)
Platelets: 212 10*3/uL (ref 150–400)
RBC: 3.95 MIL/uL — ABNORMAL LOW (ref 4.22–5.81)

## 2012-05-16 LAB — BASIC METABOLIC PANEL
CO2: 25 mEq/L (ref 19–32)
Calcium: 8.6 mg/dL (ref 8.4–10.5)
Chloride: 102 mEq/L (ref 96–112)
Creatinine, Ser: 0.81 mg/dL (ref 0.50–1.35)
GFR calc Af Amer: >90 mL/min (ref >90)
Sodium: 135 mEq/L (ref 135–145)

## 2012-05-16 LAB — TROPONIN I: Troponin I: <0.3 ng/mL (ref <0.30)

## 2012-05-16 MED ORDER — HYDROCODONE-ACETAMINOPHEN 5-325 MG PO TABS: 1.0000 | ORAL_TABLET | Freq: Four times a day (QID) | ORAL | Status: DC | PRN

## 2012-05-16 MED ORDER — BENZONATATE 100 MG PO CAPS: 100.0000 mg | ORAL_CAPSULE | Freq: Three times a day (TID) | ORAL | Status: DC

## 2012-05-16 MED ORDER — CYCLOBENZAPRINE HCL 10 MG PO TABS: 10.0000 mg | ORAL_TABLET | Freq: Two times a day (BID) | ORAL | Status: DC | PRN

## 2012-05-16 NOTE — ED Notes (Signed)
Pt to department via EMS from home- reports for the past couple of week he has had a cough. Shoulder pain and tingling in right arm. For the past 2 days has had right sided chest pain with cough. Denies any SOB, n/v. No distress on arrival. Bp-136/86 Hr-72 20g RAC, 1 nitro and 324 given by EMS.

## 2012-05-16 NOTE — ED Provider Notes (Signed)
History     CSN: [id number]  Arrival date & time 05/16/12  1623   First MD Initiated Contact with Patient 05/16/12 1627      Chief Complaint  Patient presents with  . Back Pain  . Shoulder Pain  . Chest Pain    (Consider location/radiation/quality/duration/timing/severity/associated sxs/prior treatment) HPI Comments: 57 y/o m ale h/o HIV and SZ p/w cough and cp. Onset 2-3 weeks ago. Persistent. Not improving. Chest pain worse today. Occurs only with coughing. Otherwise without pain. No pain currently. CP located on right side. No diaphoresis or SOB. No lower extremity edema.  Patient is a 57 y.o. male presenting with shoulder pain and chest pain. The history is provided by the patient.  Shoulder Pain Associated symptoms include chest pain, coughing (minimally productive. one month) and numbness (2-3 weeks. medial aspect of RUE). Pertinent negatives include no abdominal pain, chills, congestion, headaches, nausea, rash, vomiting or weakness. Fever: subjective, intermittent, one month.  Chest Pain The chest pain began more than 2 weeks ago. Chest pain occurs intermittently. The chest pain is unchanged. The pain is associated with coughing. The pain is currently at 0/10. The quality of the pain is described as aching. The pain does not radiate. Primary symptoms include cough (minimally productive. one month). Pertinent negatives for primary symptoms include no shortness of breath, no wheezing, no abdominal pain, no nausea, no vomiting, no dizziness and no altered mental status. Fever: subjective, intermittent, one month.  Associated symptoms include numbness (2-3 weeks. medial aspect of RUE).  Pertinent negatives for associated symptoms include no lower extremity edema, no orthopnea and no weakness. He tried nothing for the symptoms.     Past Medical History  Diagnosis Date  . HIV infection   . Seizures 03/04/12    [place]      History reviewed. No pertinent past surgical  history.  Family History  Problem Relation Age of Onset  . Colon cancer Neg Hx     History  Substance Use Topics  . Smoking status: Current Some Day Smoker -- 0.3 packs/day    Types: Cigarettes    Last Attempt to Quit: 01/20/2010  . Smokeless tobacco: Never Used  . Alcohol Use: No      Review of Systems  Constitutional: Negative for chills. Fever: subjective, intermittent, one month.  HENT: Negative for congestion and rhinorrhea.   Eyes: Negative for pain and visual disturbance.  Respiratory: Positive for cough (minimally productive. one month). Negative for shortness of breath and wheezing.   Cardiovascular: Positive for chest pain. Negative for orthopnea and leg swelling.  Gastrointestinal: Negative for nausea, vomiting, abdominal pain and diarrhea.  Genitourinary: Negative for flank pain and difficulty urinating.  Musculoskeletal: Negative for back pain.  Skin: Negative for color change and rash.  Neurological: Positive for numbness (2-3 weeks. medial aspect of RUE). Negative for dizziness, weakness and headaches.  Psychiatric/Behavioral: Negative for altered mental status.  All other systems reviewed and are negative.    Allergies  Review of patient's allergies indicates no known allergies.  Home Medications   Current Outpatient Rx  Name  Route  Sig  Dispense  Refill  . CARBAMAZEPINE 200 MG PO TABS      take 3 tablets by mouth every morning 2 AT NOON and 3 tablets by mouth at bedtime   240 tablet   6   . EFAVIRENZ-EMTRICITAB-TENOFOVIR 600-200-300 MG PO TABS   Oral   Take 1 tablet by mouth at bedtime.         Marland Kitchen  PHENOBARBITAL 64.8 MG PO TABS      take 1 tablet by mouth at bedtime   30 tablet   4   . BENZONATATE 100 MG PO CAPS   Oral   Take 1 capsule (100 mg total) by mouth every 8 (eight) hours.   15 capsule   0   . CYCLOBENZAPRINE HCL 10 MG PO TABS   Oral   Take 1 tablet (10 mg total) by mouth 2 (two) times daily as needed for muscle spasms.   10  tablet   0   . HYDROCODONE-ACETAMINOPHEN 5-325 MG PO TABS   Oral   Take 1 tablet by mouth every 6 (six) hours as needed for pain.   5 tablet   0     BP 120/74  Pulse 85  Temp 98.8 F (37.1 C)  Resp 18  SpO2 100%  Physical Exam  Nursing note and vitals reviewed. Constitutional: He is oriented to person, place, and time. He appears well-developed and well-nourished. No distress.  HENT:  Head: Normocephalic and atraumatic.  Eyes: Conjunctivae normal are normal. Right eye exhibits no discharge. Left eye exhibits no discharge.  Neck: No tracheal deviation present.  Cardiovascular: Normal rate, regular rhythm, normal heart sounds and intact distal pulses.   Pulmonary/Chest: Effort normal and breath sounds normal. No stridor. No respiratory distress. He has no wheezes. He has no rales.  Abdominal: Soft. He exhibits no distension. There is no tenderness. There is no guarding.  Musculoskeletal: He exhibits no edema and no tenderness.       Cervical back: Normal.       Thoracic back: Normal.       Lumbar back: Normal.  Neurological: He is alert and oriented to person, place, and time. He has normal strength. A sensory deficit (reports decreased sensation to medial aspect of proximal RUE) is present. No cranial nerve deficit (minimal drooping of right eyelid). Coordination normal. GCS eye subscore is 4. GCS verbal subscore is 5. GCS motor subscore is 6.  Skin: Skin is warm and dry.  Psychiatric: He has a normal mood and affect. His behavior is normal.    ED Course  Procedures (including critical care time)  Labs Reviewed  CBC - Abnormal; Notable for the following:    WBC 2.5 (*)     RBC 3.95 (*)     HCT 37.6 (*)     MCH 34.4 (*)     MCHC 36.2 (*)     All other components within normal limits  TROPONIN I  BASIC METABOLIC PANEL   Dg Chest 2 View  05/16/2012  *RADIOLOGY REPORT*  Clinical Data: Chest pain, back pain  CHEST - 2 VIEW  Comparison: 06/06/2006  Findings:  Cardiomediastinal silhouette is stable.  Mild hyperinflation.  No acute infiltrate or pleural effusion.  No pulmonary edema.  Stable central mild bronchitic changes.  IMPRESSION: No acute infiltrate or pulmonary edema.  Mild hyperinflation. Central mild bronchitic change.   Original Report Authenticated By: Natasha Mead, M.D.      1. Musculoskeletal pain   2. Cough   3. Viral illness      Date: 05/16/2012  Rate: 66  Rhythm: normal sinus rhythm  QRS Axis: normal  Intervals: normal  ST/T Wave abnormalities: nonspecific T wave changes  Conduction Disutrbances:none  Narrative Interpretation:   Old EKG Reviewed: none available    MDM   57 y/o male h/o HIV and sz's p/w cough and intermittent CP. Onset about 3 weeks ago. Felt like  CP little stronger today. CP only with coughing. HDS, af. Labs as above. Largely unremarkable. No fevers Likely musculoskeletal chest pain from cough. Viral illness. Unlikely PNA including PCP as CXR neg, no leukocytosis, no fever Unlikely ACS as troponin neg, EKG largely unremarkable, low risk Unlikely PE as atypical presentation, low risk per [facility], Doubt Aortic Dissection, Pancreatitis, Arrhythmia, Pneumothorax, Endo/Myo/Pericarditis, Shingles, Emergent complications of an Ulcer, Esophageal pathology, or other emergent pathology.  Dc home. Return precautions given. Follow up with primary care provider. Patient in agreement with plan   Labs and imaging reviewed by myself and considered in medical decision making if ordered. Imaging interpreted by radiology.   Discussed case with Dr. Ranae Palms who is in agreement with assessment and plan.    New Prescriptions   BENZONATATE (TESSALON) 100 MG CAPSULE    Take 1 capsule (100 mg total) by mouth every 8 (eight) hours.   CYCLOBENZAPRINE (FLEXERIL) 10 MG TABLET    Take 1 tablet (10 mg total) by mouth 2 (two) times daily as needed for muscle spasms.   HYDROCODONE-ACETAMINOPHEN (NORCO/VICODIN) 5-325 MG PER  TABLET    Take 1 tablet by mouth every 6 (six) hours as needed for pain.        Stevie Kern, MD 05/16/12 [phone]

## 2012-05-16 NOTE — ED Provider Notes (Signed)
I saw and evaluated the patient, reviewed the resident's note and I agree with the findings and plan.   Ralynn San, MD 05/16/12 2356 

## 2012-06-24 ENCOUNTER — Emergency Department (HOSPITAL_COMMUNITY)
Admission: EM | Admit: 2012-06-24 | Discharge: 2012-06-24 | Disposition: A | Discharge status: Home / Self Care | Payer: Medicare Other | Attending: Emergency Medicine | Admitting: Emergency Medicine

## 2012-06-24 ENCOUNTER — Encounter (HOSPITAL_COMMUNITY): Payer: Self-pay | Admitting: *Deleted

## 2012-06-24 ENCOUNTER — Emergency Department (HOSPITAL_COMMUNITY): Payer: Medicare Other

## 2012-06-24 DIAGNOSIS — Z79899 Other long term (current) drug therapy: Secondary | ICD-10-CM | POA: Insufficient documentation

## 2012-06-24 DIAGNOSIS — R918 Other nonspecific abnormal finding of lung field: Secondary | ICD-10-CM

## 2012-06-24 DIAGNOSIS — Z21 Asymptomatic human immunodeficiency virus [HIV] infection status: Secondary | ICD-10-CM | POA: Insufficient documentation

## 2012-06-24 DIAGNOSIS — F17200 Nicotine dependence, unspecified, uncomplicated: Secondary | ICD-10-CM | POA: Insufficient documentation

## 2012-06-24 DIAGNOSIS — G909 Disorder of the autonomic nervous system, unspecified: Secondary | ICD-10-CM | POA: Insufficient documentation

## 2012-06-24 DIAGNOSIS — R222 Localized swelling, mass and lump, trunk: Secondary | ICD-10-CM | POA: Insufficient documentation

## 2012-06-24 DIAGNOSIS — G902 Horner's syndrome: Secondary | ICD-10-CM

## 2012-06-24 DIAGNOSIS — R269 Unspecified abnormalities of gait and mobility: Secondary | ICD-10-CM | POA: Insufficient documentation

## 2012-06-24 DIAGNOSIS — G40909 Epilepsy, unspecified, not intractable, without status epilepticus: Secondary | ICD-10-CM | POA: Insufficient documentation

## 2012-06-24 DIAGNOSIS — M542 Cervicalgia: Secondary | ICD-10-CM | POA: Insufficient documentation

## 2012-06-24 LAB — CBC WITH DIFFERENTIAL/PLATELET
Basophils Absolute: 0 10*3/uL (ref 0.0–0.1)
HCT: 37.7 % — ABNORMAL LOW (ref 39.0–52.0)
Lymphocytes Relative: 37 % (ref 12–46)
Lymphs Abs: 1.4 10*3/uL (ref 0.7–4.0)
MCV: 94.3 fL (ref 78.0–100.0)
Neutro Abs: 1.9 10*3/uL (ref 1.7–7.7)
Platelets: 252 10*3/uL (ref 150–400)
RBC: 4 MIL/uL — ABNORMAL LOW (ref 4.22–5.81)
RDW: 14.2 % (ref 11.5–15.5)
WBC: 3.8 10*3/uL — ABNORMAL LOW (ref 4.0–10.5)

## 2012-06-24 LAB — BASIC METABOLIC PANEL
CO2: 27 mEq/L (ref 19–32)
Chloride: 105 mEq/L (ref 96–112)
Glucose, Bld: 96 mg/dL (ref 70–99)
Potassium: 3.9 mEq/L (ref 3.5–5.1)
Sodium: 138 mEq/L (ref 135–145)

## 2012-06-24 MED ORDER — HYDROCODONE-ACETAMINOPHEN 5-325 MG PO TABS
2.0000 | ORAL_TABLET | Freq: Once | ORAL | Status: AC
  Administered 2012-06-24: 2 via ORAL
  Filled 2012-06-24: qty 2

## 2012-06-24 MED ORDER — CYCLOBENZAPRINE HCL 10 MG PO TABS
10.0000 mg | ORAL_TABLET | Freq: Once | ORAL | Status: AC
  Administered 2012-06-24: 10 mg via ORAL
  Filled 2012-06-24: qty 1

## 2012-06-24 MED ORDER — IOHEXOL 300 MG/ML  SOLN
80.0000 mL | Freq: Once | INTRAMUSCULAR | Status: AC | PRN
  Administered 2012-06-24: 80 mL via INTRAVENOUS

## 2012-06-24 MED ORDER — OXYCODONE-ACETAMINOPHEN 5-325 MG PO TABS
1.0000 | ORAL_TABLET | ORAL | Status: DC | PRN
Start: 2012-06-24 — End: 2012-06-29

## 2012-06-24 NOTE — ED Provider Notes (Signed)
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

## 2012-06-24 NOTE — ED Notes (Signed)
Pt reports upper back pain x 1 month, thinks its pinched nerve and is radiating down right arm, also reports swelling to right eye. No distress noted at triage.

## 2012-06-24 NOTE — ED Notes (Signed)
C/o right upper back pain x 2 months. Seen by PCP but states pain meds given not working. Denies any injury. Pain worse with palpation & mvmt.

## 2012-06-24 NOTE — ED Provider Notes (Signed)
History     CSN: [id number]  Arrival date & time 06/24/12  1251   First MD Initiated Contact with Patient 06/24/12 1528      Chief Complaint  Patient presents with  . Back Pain    (Consider location/radiation/quality/duration/timing/severity/associated sxs/prior treatment) HPI Comments: Pt is 57 y.o. Male complaining of right sided neck pain that started two months ago and radiates to his scapula. Denies trauma. Hurts right now laying down. 10/10. Pt tried heat packs. Did not help. Worse when he moves. Pt came in via wheelchair.  Pt ambulates at home.  Pt notes right eye swell that has been gradual in onset. Not painful. No visual disturbances. No trauma. No interventions.   PMHx significant for HIV.  No bowel or bladder incontinence. No hx of cancer. No recent fever, night sweats.   Patient is a 57 y.o. male presenting with back pain.  Back Pain Associated symptoms: no abdominal pain, no chest pain, no dysuria, no fever, no headaches, no numbness and no weakness     Past Medical History  Diagnosis Date  . HIV infection   . Seizures 03/04/12    Grandmal      History reviewed. No pertinent past surgical history.  Family History  Problem Relation Age of Onset  . Colon cancer Neg Hx     History  Substance Use Topics  . Smoking status: Current Some Day Smoker -- 0.30 packs/day    Types: Cigarettes    Last Attempt to Quit: 01/20/2010  . Smokeless tobacco: Never Used  . Alcohol Use: No      Review of Systems  Constitutional: Negative for fever and diaphoresis.  HENT: Negative for neck pain and neck stiffness.   Eyes: Negative for visual disturbance.       Right eye swelling. No pain.  Respiratory: Negative for apnea, chest tightness and shortness of breath.   Cardiovascular: Negative for chest pain and palpitations.  Gastrointestinal: Negative for nausea, vomiting, abdominal pain, diarrhea and constipation.  Genitourinary: Negative for dysuria.   Musculoskeletal: Positive for back pain and gait problem.       Upper back. Starts right side of midline and radiates to scapula. Painful to walk, but pt able to ambulate.  Skin: Negative for rash.  Neurological: Negative for dizziness, weakness, light-headedness, numbness and headaches.    Allergies  Review of patient's allergies indicates no known allergies.  Home Medications   Current Outpatient Rx  Name  Route  Sig  Dispense  Refill  . carbamazepine (TEGRETOL) 200 MG tablet      take 3 tablets by mouth every morning 2 AT NOON and 3 tablets by mouth at bedtime   240 tablet   6   . efavirenz-emtricitabine-tenofovir (ATRIPLA) 600-200-300 MG per tablet   Oral   Take 1 tablet by mouth at bedtime.           BP 160/89  Pulse 89  Temp(Src) 98.7 F (37.1 C) (Oral)  Resp 18  SpO2 96%  Physical Exam  Nursing note and vitals reviewed. Constitutional: He is oriented to person, place, and time. He appears well-developed and well-nourished. No distress.  HENT:  Head: Normocephalic and atraumatic.  Eyes: Conjunctivae and EOM are normal.  Neck: Normal range of motion. Neck supple.  No meningeal signs  Cardiovascular: Normal rate, regular rhythm and normal heart sounds.  Exam reveals no gallop and no friction rub.   No murmur heard. Pulmonary/Chest: Effort normal and breath sounds normal. No respiratory distress. He has  no wheezes. He has no rales. He exhibits no tenderness.  Abdominal: Soft. Bowel sounds are normal. He exhibits no distension. There is no tenderness. There is no rebound and no guarding.  Musculoskeletal: Normal range of motion. He exhibits no edema and no tenderness.  Right shoulder. FROM. No crepitus. No laxity. 5/5 strength.   Neurological: He is alert and oriented to person, place, and time. No cranial nerve deficit.  Skin: Skin is warm and dry. He is not diaphoretic. No erythema.    ED Course  Procedures (including critical care time) No information on  file.  Results for orders placed during the hospital encounter of 06/24/12  CBC WITH DIFFERENTIAL      Result Value Range   WBC 3.8 (*) 4.0 - 10.5 K/uL   RBC 4.00 (*) 4.22 - 5.81 MIL/uL   Hemoglobin 13.7  13.0 - 17.0 g/dL   HCT 16.1 (*) 09.6 - 04.5 %   MCV 94.3  78.0 - 100.0 fL   MCH 34.3 (*) 26.0 - 34.0 pg   MCHC 36.3 (*) 30.0 - 36.0 g/dL   RDW 40.9  81.1 - 91.4 %   Platelets 252  150 - 400 K/uL   Neutrophils Relative 50  43 - 77 %   Neutro Abs 1.9  1.7 - 7.7 K/uL   Lymphocytes Relative 37  12 - 46 %   Lymphs Abs 1.4  0.7 - 4.0 K/uL   Monocytes Relative 12  3 - 12 %   Monocytes Absolute 0.4  0.1 - 1.0 K/uL   Eosinophils Relative 2  0 - 5 %   Eosinophils Absolute 0.1  0.0 - 0.7 K/uL   Basophils Relative 0  0 - 1 %   Basophils Absolute 0.0  0.0 - 0.1 K/uL  BASIC METABOLIC PANEL      Result Value Range   Sodium 138  135 - 145 mEq/L   Potassium 3.9  3.5 - 5.1 mEq/L   Chloride 105  96 - 112 mEq/L   CO2 27  19 - 32 mEq/L   Glucose, Bld 96  70 - 99 mg/dL   BUN 15  6 - 23 mg/dL   Creatinine, Ser 7.82  0.50 - 1.35 mg/dL   Calcium 9.0  8.4 - 95.6 mg/dL   GFR calc non Af Amer 81 (*) >90 mL/min   GFR calc Af Amer >90  >90 mL/min   Ct Chest W Contrast  06/24/2012  *RADIOLOGY REPORT*  Clinical Data: Back pain  CT CHEST WITH CONTRAST  Technique:  Multidetector CT imaging of the chest was performed following the standard protocol during bolus administration of intravenous contrast.  Contrast: 80mL OMNIPAQUE IOHEXOL 300 MG/ML  SOLN  Comparison: Chest radiograph 01/25 1014  Findings: There is no axillary or supraclavicular lymphadenopathy. There are shotty bilateral axillary lymph nodes.  Shotty submental lymph nodes are incidentally imaged.  No supraclavicular adenopathy is evident.  At the right lung apex,  there is an ill-defined low density pleural parenchymal thickening measuring 3.5 x 3.3 cm which has Hounsfield units ranging from 10 to 28.  A vessel  courses through this tissue.  There is  no mediastinal adenopathy.  No hilar adenopathy.  No pericardial fluid.  Esophagus is normal.  Review of the lung parenchyma demonstrates some mild interlobular septal thickening adjacent to the soft tissue irregular density in the medial right upper lobe described above.  There is central lobular emphysema.  The right lower lobe there is a 4 mm pulmonary  nodule (image 46).  There is a ill-defined nodule in the left upper lobe measuring 8 mm (image 22).  Limited view of the upper abdomen is unremarkable.  Limited view of the skeleton  IMPRESSION:  1.  Ill-defined mass-like relatively low density tissue at the medial right lung apex of unclear etiology. Cannot exclude malignant process.  Lesion is large enough such that FDG PET/CT scan would aid in assessing malignant potential.  2.  Scattered small pulmonary nodules are likely benign.  Recommend attention on follow-up. 3.  Emphysematous change.   Original Report Authenticated By: Genevive Bi, M.D.    Labs Reviewed - No data to display No results found.   Diagnosis: lung mass, Horner's syndrome    MDM  Clinical picture not clear. Brought Dr. 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 No other focal neuro deficits. Prompted search for a possible tumor in the lung causing these neuro deficits. CT with contrast showed lung mass in right lung apex of unclear etiology. Dr. Bebe Shaggy spoke to hospitalist and it was determined that admission would not help the pt at this point who is other wise stable. Outpt treatment with heme-onc coordinator decided as best course. Provided name and number to family (Heme/Onc New Patient Coordinator', [place] Cerise, [phone])  Spoke with pt with family at bedside of the findings and the importance of following up with pt doctor and heme/onc coordinator right away. Pt and family understood. Pt will be discharged with pain management and  strict follow up. Dr. Bebe Shaggy is in agreement with the plan.   Glade Nurse, PA-C 06/25/12 [address] [place] 06/25/12 1131

## 2012-06-24 NOTE — ED Provider Notes (Signed)
Lung mass noted on CT imaging, could be in location that could cause horner syndrome Pt otherwise stable I spoke to hospitalist, and at this point there would be minimal gain from admission but could be managed as outpatient by PCP as well as hem-onc coordinator   Joya Gaskins, MD 06/24/12 2115

## 2012-06-24 NOTE — ED Notes (Signed)
Pt dc to home.  Pt states understanding to dc instructions.  Pt ambulatory to exit without difficulty.  Denies need for w/c.

## 2012-06-26 NOTE — ED Provider Notes (Signed)
Medical screening examination/treatment/procedure(s) were conducted as a shared visit with non-physician practitioner(s) and myself.  I personally evaluated the patient during the encounter  Pt given multiple resources to have followup for this lung mass.  Pt otherwise stable and it appears workup can be established as outpatient  Joya Gaskins, MD 06/26/12 [phone]

## 2012-06-29 ENCOUNTER — Ambulatory Visit (INDEPENDENT_AMBULATORY_CARE_PROVIDER_SITE_OTHER): Payer: Medicare Other | Admitting: Infectious Diseases

## 2012-06-29 VITALS — BP 163/98 | HR 83 | Temp 98.3°F | Ht 69.0 in | Wt 150.0 lb

## 2012-06-29 DIAGNOSIS — R918 Other nonspecific abnormal finding of lung field: Secondary | ICD-10-CM | POA: Insufficient documentation

## 2012-06-29 DIAGNOSIS — R222 Localized swelling, mass and lump, trunk: Secondary | ICD-10-CM

## 2012-06-29 DIAGNOSIS — Z113 Encounter for screening for infections with a predominantly sexual mode of transmission: Secondary | ICD-10-CM

## 2012-06-29 DIAGNOSIS — Z79899 Other long term (current) drug therapy: Secondary | ICD-10-CM

## 2012-06-29 DIAGNOSIS — B20 Human immunodeficiency virus [HIV] disease: Secondary | ICD-10-CM

## 2012-06-29 LAB — CBC
HCT: 38.1 % — ABNORMAL LOW (ref 39.0–52.0)
Hemoglobin: 13.4 g/dL (ref 13.0–17.0)
MCH: 32.8 pg (ref 26.0–34.0)
MCV: 93.4 fL (ref 78.0–100.0)
Platelets: 260 10*3/uL (ref 150–400)
RBC: 4.08 MIL/uL — ABNORMAL LOW (ref 4.22–5.81)
WBC: 2.9 10*3/uL — ABNORMAL LOW (ref 4.0–10.5)

## 2012-06-29 MED ORDER — OXYCODONE-ACETAMINOPHEN 5-325 MG PO TABS: 1.0000 | ORAL_TABLET | ORAL | Status: DC | PRN

## 2012-06-29 NOTE — Progress Notes (Signed)
  Subjective:    Patient ID: Roger Walton, male    DOB: March 14, 1956, 57 y.o.   MRN: [id number]  HPI 57 yo M with HIV+ who has been maintained on [place].  Has previous genotype showing background PI mutations. Also hx of seizure disorder. Previous smoker. Was seen in Ed in January with cough, returned in March with continued cough and found on CT to have R apex lung mass and horner's syndrome (for 2 months also). Per his family has had random fever and chills (no checked). Ost 15# since October.  Today c/o R shoulder pain- for 2 months, sharp (10/10).  No SOB. No problems with meds.   HIV 1 RNA Quant (copies/mL)  Date Value  02/19/2012 90*  08/05/2011 39*  01/07/2011 25*     CD4 T Cell Abs (cmm)  Date Value  02/19/2012 120*  08/05/2011 160*  01/07/2011 190*      Review of Systems     Objective:   Physical Exam  Constitutional: He appears well-developed and well-nourished.  HENT:  Mouth/Throat: No oropharyngeal exudate.  Eyes: EOM are normal. Pupils are equal, round, and reactive to light.    Neck: Neck supple.  Cardiovascular: Normal rate, regular rhythm and normal heart sounds.   Pulmonary/Chest: Effort normal and breath sounds normal.  Abdominal: Soft. Bowel sounds are normal. There is no tenderness. There is no rebound.  Lymphadenopathy:    He has no cervical adenopathy.    He has no axillary adenopathy.       Right: No supraclavicular adenopathy present.       Left: No supraclavicular adenopathy present.          Assessment & Plan:

## 2012-06-29 NOTE — Assessment & Plan Note (Signed)
Will recheck his labs. He states he has been taking his ART.

## 2012-06-29 NOTE — Assessment & Plan Note (Addendum)
Will check PET and have him seen by [facility]. Refill his percocet

## 2012-06-30 LAB — COMPREHENSIVE METABOLIC PANEL
ALT: 14 U/L (ref 0–53)
BUN: 17 mg/dL (ref 6–23)
CO2: 27 mEq/L (ref 19–32)
Calcium: 9 mg/dL (ref 8.4–10.5)
Chloride: 106 mEq/L (ref 96–112)
Creat: 1.03 mg/dL (ref 0.50–1.35)
Glucose, Bld: 121 mg/dL — ABNORMAL HIGH (ref 70–99)

## 2012-06-30 LAB — T-HELPER CELL (CD4) - (RCID CLINIC ONLY): CD4 % Helper T Cell: 13 % — ABNORMAL LOW (ref 33–55)

## 2012-06-30 LAB — HIV-1 RNA ULTRAQUANT REFLEX TO GENTYP+
HIV 1 RNA Quant: 54 copies/mL — ABNORMAL HIGH (ref <20)
HIV-1 RNA Quant, Log: 1.73 {Log} — ABNORMAL HIGH (ref <1.30)

## 2012-06-30 LAB — LIPID PANEL
Cholesterol: 151 mg/dL (ref 0–200)
VLDL: 19 mg/dL (ref 0–40)

## 2012-07-02 ENCOUNTER — Other Ambulatory Visit: Payer: Self-pay | Admitting: Infectious Diseases

## 2012-07-03 ENCOUNTER — Ambulatory Visit (HOSPITAL_COMMUNITY)
Admission: RE | Admit: 2012-07-03 | Discharge: 2012-07-03 | Disposition: A | Discharge status: Home / Self Care | Payer: Medicare Other | Source: Ambulatory Visit | Attending: Infectious Diseases | Admitting: Infectious Diseases

## 2012-07-03 ENCOUNTER — Other Ambulatory Visit: Payer: Self-pay | Admitting: *Deleted

## 2012-07-03 DIAGNOSIS — D491 Neoplasm of unspecified behavior of respiratory system: Secondary | ICD-10-CM | POA: Insufficient documentation

## 2012-07-03 DIAGNOSIS — R911 Solitary pulmonary nodule: Secondary | ICD-10-CM | POA: Insufficient documentation

## 2012-07-03 DIAGNOSIS — R569 Unspecified convulsions: Secondary | ICD-10-CM

## 2012-07-03 DIAGNOSIS — E279 Disorder of adrenal gland, unspecified: Secondary | ICD-10-CM | POA: Insufficient documentation

## 2012-07-03 DIAGNOSIS — R918 Other nonspecific abnormal finding of lung field: Secondary | ICD-10-CM

## 2012-07-03 DIAGNOSIS — M799 Soft tissue disorder, unspecified: Secondary | ICD-10-CM | POA: Insufficient documentation

## 2012-07-03 MED ORDER — PHENOBARBITAL 64.8 MG PO TABS: 64.8000 mg | ORAL_TABLET | Freq: Every day | ORAL | Status: DC

## 2012-07-03 MED ORDER — FLUDEOXYGLUCOSE F - 18 (FDG) INJECTION
19.0000 | Freq: Once | INTRAVENOUS | Status: AC | PRN
  Administered 2012-07-03: 19 via INTRAVENOUS

## 2012-07-03 NOTE — Telephone Encounter (Signed)
Patient called and advised we had denied his Rx and said we have not filled this Rx before and he advised we have because Dr Ninetta Lights is his PCP. After review of the chart found we have done this and because it is a anti seizure medication authorized a refill. Advised the patient he may need to discuss with Dr Ninetta Lights at his next visit finding a PCP as he has insurance.

## 2012-07-06 ENCOUNTER — Telehealth: Payer: Self-pay

## 2012-07-06 NOTE — Telephone Encounter (Signed)
Requesting results from x rays.   Please advise.  Laurell Josephs, RN

## 2012-07-07 ENCOUNTER — Ambulatory Visit (INDEPENDENT_AMBULATORY_CARE_PROVIDER_SITE_OTHER): Payer: Medicare Other | Admitting: Thoracic Surgery (Cardiothoracic Vascular Surgery)

## 2012-07-07 ENCOUNTER — Encounter: Payer: Self-pay | Admitting: Thoracic Surgery (Cardiothoracic Vascular Surgery)

## 2012-07-07 ENCOUNTER — Telehealth: Payer: Self-pay | Admitting: *Deleted

## 2012-07-07 ENCOUNTER — Telehealth: Payer: Self-pay | Admitting: Infectious Diseases

## 2012-07-07 VITALS — BP 147/95 | HR 100 | Temp 98.9°F | Resp 20 | Ht 69.0 in | Wt 150.0 lb

## 2012-07-07 DIAGNOSIS — R222 Localized swelling, mass and lump, trunk: Secondary | ICD-10-CM

## 2012-07-07 DIAGNOSIS — R918 Other nonspecific abnormal finding of lung field: Secondary | ICD-10-CM

## 2012-07-07 NOTE — Telephone Encounter (Signed)
left non-specific message. asked them to page me through operator. would rather speak to pt than leave details of test.

## 2012-07-07 NOTE — Telephone Encounter (Signed)
left non-specific message. asked them to page me through operator. would rather speak to pt than leave details of test.  

## 2012-07-07 NOTE — Telephone Encounter (Signed)
Patient's wife called stating that the percocet is not helping for pain. Advised that it was given for the pain he is having from the lung mass not his HIV. He has an appt today with Dr. Dorris Fetch regarding the mass and he should discuss his pain at that time. Wendall Mola CMA

## 2012-07-07 NOTE — Progress Notes (Signed)
PCP is [name], MD Referring Provider is Ginnie Smart, MD  Chief Complaint  Patient presents with  . Lung Mass    surgical eval on lung mass, Chest CT 06/24/12, PET Scan 07/03/12    HPI: 57 year old male presents with chief complaint of right shoulder pain.  History of present illness [name] is a 57 year old gentleman with known HIV. He also has a 40-pack-year history of smoking. He was in his usual state of health until a couple of months ago when he started experiencing right shoulder pain. He describes as starting in the scapula coming through the shoulder and then anteriorly into his anterior chest wall. He also has pain that runs down his right arm. He says the pain is severe and almost constant. Movement does worsen the pain but lack of movement does not totally relieve the pain. He also is noted some "swelling" in his right eye.  A chest x-ray in January was unremarkable. He continued to have pain and a CT was done on 06/24/2012. It showed a right apical mass consistent with a Pancoast tumor. A PET CT then was done on 07/03/12 which showed this lesion to be hypermetabolic. There was no evidence of regional or distant metastases.  Past Medical History  Diagnosis Date  . HIV infection   . Seizures 03/04/12    Grandmal      No past surgical history on file.  Family History  Problem Relation Age of Onset  . Colon cancer Neg Hx     Social History History  Substance Use Topics  . Smoking status: Current Some Day Smoker -- 1.00 packs/day for 40 years    Types: Cigarettes  . Smokeless tobacco: Never Used  . Alcohol Use: No    Current Outpatient Prescriptions  Medication Sig Dispense Refill  . carbamazepine (TEGRETOL) 200 MG tablet take 3 tablets by mouth every morning 2 AT NOON and 3 tablets by mouth at bedtime  240 tablet  6  . efavirenz-emtricitabine-tenofovir (ATRIPLA) 600-200-300 MG per tablet Take 1 tablet by mouth at bedtime.      Marland Kitchen  oxyCODONE-acetaminophen (PERCOCET) 5-325 MG per tablet Take 1 tablet by mouth every 4 (four) hours as needed for pain.  40 tablet  0  . PHENobarbital (LUMINAL) 64.8 MG tablet Take 1 tablet (64.8 mg total) by mouth at bedtime.  30 tablet  5   No current facility-administered medications for this visit.    No Known Allergies  Review of Systems  Constitutional: Positive for unexpected weight change (lost 10 lbs in 6 months and 20 pounds in 1 year).  Eyes: Positive for visual disturbance.  Respiratory: Positive for shortness of breath.   Cardiovascular: Positive for chest pain.  Musculoskeletal: Positive for myalgias, back pain, joint swelling and arthralgias.       Right shoulder, scapula and arm pain for ~2 months  Neurological: Positive for dizziness and seizures (long history, controlled with medication).       Memory loss  Psychiatric/Behavioral: Positive for dysphoric mood. The patient is nervous/anxious.   All other systems reviewed and are negative.    BP 147/95  Pulse 100  Temp(Src) 98.9 F (37.2 C) (Oral)  Resp 20  Ht 5\' 9"  (1.753 m)  Wt 150 lb (68.04 kg)  BMI 22.14 kg/m2  SpO2 94% Physical Exam  Vitals reviewed. Constitutional: He is oriented to person, place, and time. He appears well-developed and well-nourished. No distress.  HENT:  Head: Normocephalic and atraumatic.  Eyes: EOM are normal.  Right ptosis and miosis  Neck: Neck supple. No thyromegaly present.  Cardiovascular: Normal rate, regular rhythm, normal heart sounds and intact distal pulses.  Exam reveals no friction rub.   No murmur heard. Pulmonary/Chest: Effort normal and breath sounds normal. He has no wheezes. He has no rales. He exhibits tenderness (right shoulder and upper chest).  Abdominal: Soft. There is no tenderness.  Musculoskeletal: Normal range of motion. He exhibits no edema.  Lymphadenopathy:    He has no cervical adenopathy.  Neurological: He is alert and oriented to person, place, and  time. A cranial nerve deficit (right Horner's syndrome) is present.  Skin: Skin is warm and dry.  Psychiatric: He has a normal mood and affect.     Diagnostic Tests: CT of chest 06/23/2012 Clinical Data: Back pain  CT CHEST WITH CONTRAST  Technique: Multidetector CT imaging of the chest was performed  following the standard protocol during bolus administration of  intravenous contrast.  Contrast: 80mL OMNIPAQUE IOHEXOL 300 MG/ML SOLN  Comparison: Chest radiograph 01/25 1014  Findings: There is no axillary or supraclavicular lymphadenopathy.  There are shotty bilateral axillary lymph nodes. Shotty submental  lymph nodes are incidentally imaged. No supraclavicular adenopathy  is evident.  At the right lung apex, there is an ill-defined low density  pleural parenchymal thickening measuring 3.5 x 3.3 cm which has  Hounsfield units ranging from 10 to 28. A vessel courses through  this tissue.  There is no mediastinal adenopathy. No hilar adenopathy. No  pericardial fluid. Esophagus is normal.  Review of the lung parenchyma demonstrates some mild interlobular  septal thickening adjacent to the soft tissue irregular density in  the medial right upper lobe described above. There is central  lobular emphysema. The right lower lobe there is a 4 mm pulmonary  nodule (image 46). There is a ill-defined nodule in the left upper  lobe measuring 8 mm (image 22).  Limited view of the upper abdomen is unremarkable.  Limited view of the skeleton  IMPRESSION:  1. Ill-defined mass-like relatively low density tissue at the  medial right lung apex of unclear etiology. Cannot exclude  malignant process. Lesion is large enough such that FDG PET/CT  scan would aid in assessing malignant potential.  2. Scattered small pulmonary nodules are likely benign. Recommend  attention on follow-up.  3. Emphysematous change.  PET/CT 07/03/2012 NUCLEAR MEDICINE PET SKULL BASE TO THIGH  Fasting Blood Glucose: 93   Technique: 19 mCi F-18 FDG was injected intravenously. CT data was  obtained and used for attenuation correction and anatomic  localization only. (This was not acquired as a diagnostic CT  examination.) Additional exam technical data entered on  technologist worksheet.  Comparison: None  Findings:  Neck: No hypermetabolic lymph nodes in the neck.  Chest: Right superior sulcus tumor is identified. This measures  2.6 x 4.0 x 4.5 cm. The SUV max associated this mass is equal to  12.2. No hypermetabolic mediastinal or hilar adenopathy. Irregular  nodular density in the left upper lobe is again noted measuring 8  mm, image 82. This has an SUV max equal to 0.9.  Mild increased FDG uptake is associated with bilateral axillary  lymph nodes which have a benign appearance containing fatty hilum.  The SUV max within the left axilla is equal to 4.5, image 71. FDG  uptake within the right axilla has an SUV max equal to 3.1, image  65.  Abdomen/Pelvis: No abnormal hypermetabolic activity within the  liver, pancreas, adrenal glands,  or spleen. No hypermetabolic  lymph nodes in the abdomen or pelvis. Right adrenal nodule is  identified measuring 1.7 cm. Likely benign adenoma. Left adrenal  gland is normal.  Skeleton: No focal hypermetabolic activity to suggest skeletal  metastasis. Soft tissue attenuating nodule within the left buttock  region is noted measuring 2.4 x 1.7 cm. There is no FDG uptake  within this nodule which likely represents a sebaceous cyst  IMPRESSION:  1. 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 This is worrisome for primary bronchogenic carcinoma.  Correlation with biopsy is advised. No evidence for distant  metastatic disease.  2. 8 mm sub solid nodule in the left upper lobe exhibits low  level, non malignant FDG uptake.  3. Right adrenal nodule. Likely benign adenoma.   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 He has classic Pancoast symptoms with a right sided Horner's syndrome and right shoulder and arm pain. This has not yet been confirmed to be malignant but almost certainly does represent a primary lung cancer.  We need to establish a diagnosis. I think the best option for that is a CT-guided needle biopsy. We will schedule that.  He will need pulmonary function testing but I think he will have adequate pulmonary reserve to tolerate treatment.  He does need an MR of the brain to rule out brain metastases given that this is at least a stage IIB tumor.  He is having severe pain. I suspect that he will continue to have pain relatively resistant to medication until he started on radiation therapy. However we cannot start radiation therapy until we have a diagnosis. I gave him a prescription for Dilaudid 2 mg tablets one to 2 tablets every 4 hours as needed for pain. Dispense 50 tablets, no refills. I advised him to only take 2 at a time when his pain was extremely severe.  We will plan to have him seen by radiation oncology and hematology oncology once the diagnosis is confirmed.  Plan: 1. Pulmonary function testing  2. CT-guided needle biopsy  3. MR brain  4. Followup in [facility] to discuss radiation and chemotherapy

## 2012-07-08 ENCOUNTER — Other Ambulatory Visit: Payer: Self-pay

## 2012-07-08 DIAGNOSIS — R51 Headache: Secondary | ICD-10-CM

## 2012-07-08 DIAGNOSIS — R222 Localized swelling, mass and lump, trunk: Secondary | ICD-10-CM

## 2012-07-08 DIAGNOSIS — D381 Neoplasm of uncertain behavior of trachea, bronchus and lung: Secondary | ICD-10-CM

## 2012-07-10 ENCOUNTER — Ambulatory Visit (HOSPITAL_COMMUNITY)
Admission: RE | Admit: 2012-07-10 | Discharge: 2012-07-10 | Disposition: A | Discharge status: Home / Self Care | Payer: Medicare Other | Source: Ambulatory Visit | Attending: Thoracic Surgery (Cardiothoracic Vascular Surgery) | Admitting: Thoracic Surgery (Cardiothoracic Vascular Surgery)

## 2012-07-10 ENCOUNTER — Other Ambulatory Visit: Payer: Self-pay | Admitting: Radiology

## 2012-07-10 DIAGNOSIS — R51 Headache: Secondary | ICD-10-CM

## 2012-07-10 DIAGNOSIS — G319 Degenerative disease of nervous system, unspecified: Secondary | ICD-10-CM | POA: Insufficient documentation

## 2012-07-10 DIAGNOSIS — R222 Localized swelling, mass and lump, trunk: Secondary | ICD-10-CM | POA: Insufficient documentation

## 2012-07-10 DIAGNOSIS — D381 Neoplasm of uncertain behavior of trachea, bronchus and lung: Secondary | ICD-10-CM

## 2012-07-10 LAB — PULMONARY FUNCTION TEST

## 2012-07-10 MED ORDER — GADOBENATE DIMEGLUMINE 529 MG/ML IV SOLN
14.0000 mL | Freq: Once | INTRAVENOUS | Status: AC | PRN
  Administered 2012-07-10: 14 mL via INTRAVENOUS

## 2012-07-10 MED ORDER — ALBUTEROL SULFATE (5 MG/ML) 0.5% IN NEBU
2.5000 mg | INHALATION_SOLUTION | Freq: Once | RESPIRATORY_TRACT | Status: AC
  Administered 2012-07-10: 2.5 mg via RESPIRATORY_TRACT

## 2012-07-10 MED ORDER — GADOBENATE DIMEGLUMINE 529 MG/ML IV SOLN: 14.0000 mL | Freq: Once | INTRAVENOUS | Status: DC

## 2012-07-13 ENCOUNTER — Observation Stay (HOSPITAL_COMMUNITY)
Admission: RE | Admit: 2012-07-13 | Discharge: 2012-07-13 | Disposition: A | Discharge status: Home / Self Care | Payer: Medicare Other | Source: Ambulatory Visit | Attending: Thoracic Surgery (Cardiothoracic Vascular Surgery) | Admitting: Thoracic Surgery (Cardiothoracic Vascular Surgery)

## 2012-07-13 ENCOUNTER — Ambulatory Visit (HOSPITAL_COMMUNITY)
Admission: RE | Admit: 2012-07-13 | Discharge: 2012-07-13 | Disposition: A | Discharge status: Home / Self Care | Payer: Medicare Other | Source: Ambulatory Visit | Attending: Thoracic Surgery (Cardiothoracic Vascular Surgery) | Admitting: Thoracic Surgery (Cardiothoracic Vascular Surgery)

## 2012-07-13 ENCOUNTER — Ambulatory Visit (HOSPITAL_COMMUNITY)
Admission: RE | Admit: 2012-07-13 | Discharge: 2012-07-13 | Disposition: A | Discharge status: Home / Self Care | Payer: Medicare Other | Source: Ambulatory Visit | Attending: Interventional Radiology | Admitting: Interventional Radiology

## 2012-07-13 DIAGNOSIS — R42 Dizziness and giddiness: Secondary | ICD-10-CM | POA: Insufficient documentation

## 2012-07-13 DIAGNOSIS — R0789 Other chest pain: Secondary | ICD-10-CM | POA: Insufficient documentation

## 2012-07-13 DIAGNOSIS — Z21 Asymptomatic human immunodeficiency virus [HIV] infection status: Secondary | ICD-10-CM | POA: Insufficient documentation

## 2012-07-13 DIAGNOSIS — C3410 Malignant neoplasm of upper lobe, unspecified bronchus or lung: Secondary | ICD-10-CM | POA: Insufficient documentation

## 2012-07-13 DIAGNOSIS — R222 Localized swelling, mass and lump, trunk: Secondary | ICD-10-CM

## 2012-07-13 DIAGNOSIS — C3490 Malignant neoplasm of unspecified part of unspecified bronchus or lung: Secondary | ICD-10-CM

## 2012-07-13 HISTORY — DX: Malignant neoplasm of unspecified part of unspecified bronchus or lung: C34.90

## 2012-07-13 LAB — PROTIME-INR
INR: 1.07 (ref 0.00–1.49)
Prothrombin Time: 13.8 seconds (ref 11.6–15.2)

## 2012-07-13 LAB — CBC
MCV: 93.6 fL (ref 78.0–100.0)
Platelets: 279 10*3/uL (ref 150–400)
RDW: 13.3 % (ref 11.5–15.5)
WBC: 3.1 10*3/uL — ABNORMAL LOW (ref 4.0–10.5)

## 2012-07-13 LAB — APTT: aPTT: 32 seconds (ref 24–37)

## 2012-07-13 MED ORDER — MIDAZOLAM HCL 2 MG/2ML IJ SOLN
INTRAMUSCULAR | Status: AC
  Filled 2012-07-13: qty 6

## 2012-07-13 MED ORDER — FENTANYL CITRATE 0.05 MG/ML IJ SOLN
INTRAMUSCULAR | Status: AC
  Filled 2012-07-13: qty 6

## 2012-07-13 MED ORDER — FENTANYL CITRATE 0.05 MG/ML IJ SOLN
INTRAMUSCULAR | Status: AC | PRN
  Administered 2012-07-13 (×3): 50 ug via INTRAVENOUS

## 2012-07-13 MED ORDER — MIDAZOLAM HCL 2 MG/2ML IJ SOLN
INTRAMUSCULAR | Status: AC | PRN
  Administered 2012-07-13: 0.5 mg via INTRAVENOUS
  Administered 2012-07-13 (×2): 1 mg via INTRAVENOUS

## 2012-07-13 MED ORDER — SODIUM CHLORIDE 0.9 % IV SOLN: INTRAVENOUS | Status: DC

## 2012-07-13 NOTE — Procedures (Signed)
Technically successful US guided FNA of hypermetabolic right apical lung mass.  No immediate complications.

## 2012-07-13 NOTE — H&P (Signed)
Roger Walton is an 57 y.o. male.   Chief Complaint: "I'm here for a biopsy" [id number] with history of smoking, HIV, right shoulder pain and hypermetabolic right apical lung mass/Pancoast tumor presents today for US/CT guided right apical lung mass biopsy.  Past Medical History  Diagnosis Date  . HIV infection   . Seizures 03/04/12    Grandmal      No past surgical history on file.  Family History  Problem Relation Age of Onset  . Colon cancer Neg Hx    Social History:  reports that he has been smoking Cigarettes.  He has a 40 pack-year smoking history. He has never used smokeless tobacco. He reports that he does not drink alcohol or use illicit drugs.  Allergies: No Known Allergies  Current outpatient prescriptions:carbamazepine (TEGRETOL) 200 MG tablet, take 3 tablets by mouth every morning 2 AT NOON and 3 tablets by mouth at bedtime, Disp: 240 tablet, Rfl: 6;  efavirenz-emtricitabine-tenofovir (ATRIPLA) 600-200-300 MG per tablet, Take 1 tablet by mouth at bedtime., Disp: , Rfl: ;  oxyCODONE-acetaminophen (PERCOCET) 5-325 MG per tablet, Take 1 tablet by mouth every 4 (four) hours as needed for pain., Disp: 40 tablet, Rfl: 0 PHENobarbital (LUMINAL) 64.8 MG tablet, Take 1 tablet (64.8 mg total) by mouth at bedtime., Disp: 30 tablet, Rfl: 5 Current facility-administered medications:0.9 %  sodium chloride infusion, , Intravenous, Continuous, D Jeananne Rama, PA-C 07/13/2012 labs pending  Review of Systems  Constitutional: Positive for weight loss. Negative for fever and chills.  Respiratory: Negative for cough.        Mild dyspnea  Cardiovascular:       Rt upper chest discomfort  Gastrointestinal: Negative for nausea, vomiting and abdominal pain.  Musculoskeletal: Negative for back pain.       Rt shoulder/arm pain  Neurological: Negative for headaches.       Occ dizziness; hx seizures  Endo/Heme/Allergies: Does not bruise/bleed easily.   Vitals:  BP 156/89  HR 88  R 18  TEMP 98.6   O2 SATS 95%RA Physical Exam  Constitutional: He is oriented to person, place, and time. He appears well-developed and well-nourished.  Eyes:  Rt ptosis/miosis  Cardiovascular: Normal rate and regular rhythm.   Respiratory: Effort normal.  Sl dim BS rt upper lung field  GI: Soft. Bowel sounds are normal. There is no tenderness.  Musculoskeletal: Normal range of motion. He exhibits no edema.  Neurological: He is alert and oriented to person, place, and time.     Assessment/Plan Pt with hypermetabolic right apical lung mass/Pancoast tumor. Plan is for US/CT guided biopsy today. Details/risks of procedure d/w pt/wife with their understanding and consent.  ALLRED,D KEVIN 07/13/2012, 8:25 AM

## 2012-07-15 ENCOUNTER — Other Ambulatory Visit: Payer: Self-pay | Admitting: *Deleted

## 2012-07-16 ENCOUNTER — Ambulatory Visit (HOSPITAL_BASED_OUTPATIENT_CLINIC_OR_DEPARTMENT_OTHER): Payer: Medicare Other | Admitting: Internal Medicine

## 2012-07-16 ENCOUNTER — Encounter: Payer: Self-pay | Admitting: Internal Medicine

## 2012-07-16 ENCOUNTER — Encounter: Payer: Self-pay | Admitting: *Deleted

## 2012-07-16 ENCOUNTER — Ambulatory Visit
Admission: RE | Admit: 2012-07-16 | Discharge: 2012-07-16 | Disposition: A | Discharge status: Home / Self Care | Payer: Medicare Other | Source: Ambulatory Visit | Attending: Radiation Oncology | Admitting: Radiation Oncology

## 2012-07-16 ENCOUNTER — Encounter: Payer: Self-pay | Admitting: Radiation Oncology

## 2012-07-16 VITALS — BP 160/102 | HR 90 | Temp 98.1°F | Resp 20 | Ht 69.0 in | Wt 148.0 lb

## 2012-07-16 DIAGNOSIS — C3490 Malignant neoplasm of unspecified part of unspecified bronchus or lung: Secondary | ICD-10-CM

## 2012-07-16 DIAGNOSIS — C3410 Malignant neoplasm of upper lobe, unspecified bronchus or lung: Secondary | ICD-10-CM | POA: Insufficient documentation

## 2012-07-16 DIAGNOSIS — B20 Human immunodeficiency virus [HIV] disease: Secondary | ICD-10-CM

## 2012-07-16 MED ORDER — OXYCODONE-ACETAMINOPHEN 5-325 MG PO TABS: 1.0000 | ORAL_TABLET | Freq: Four times a day (QID) | ORAL | Status: DC | PRN

## 2012-07-16 MED ORDER — PROCHLORPERAZINE MALEATE 10 MG PO TABS: 10.0000 mg | ORAL_TABLET | Freq: Four times a day (QID) | ORAL | Status: DC | PRN

## 2012-07-16 NOTE — Progress Notes (Signed)
[facility] Clinical Social Work  Clinical Social Work met with patient, patient's spouse, Theatre stage manager, and Systems developer at [employer] today.  MD discussed patient's diagnosis and treatment plan with patient and family.  Mr. Pascucci had very few questions and agrees to treatment plan.  At this time, patient and family only indicated transportation as main barrier to care.  CSW discussed [facility] transportation and patient agreeed.  CSW turned in application for [facility] and is awaiting confirmation for approval.  CSW will follow up with patient once approved.  Kathrin Penner, MSW, LCSW Clinical Social Worker [facility] [phone]

## 2012-07-16 NOTE — Patient Instructions (Addendum)
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 We discussed treatment options including concurrent chemoradiation. First cycle of treatment expected on 07/27/2012.

## 2012-07-16 NOTE — Progress Notes (Signed)
Spoke with pt and family at [facility] today.  Gave and explained educational/resource inforamation on lung cancer. Distress screening completed and given to Leotis Shames, Child psychotherapist.  Pt expresses concern regarding transportation to [facility].  Lauren is working with pt on this issue

## 2012-07-16 NOTE — Progress Notes (Signed)
[facility] [phone]   [phone]) [phone] Multidisciplinary thoracic oncology clinic ([facility]) CONSULT NOTE  REFERRING PHYSICIAN: Dr. Charlett Lango  REASON FOR CONSULTATION:  57 years old African American male recently diagnosed with lung cancer.  HPI Roger Walton is a 57 y.o. male was past medical history significant for HIV, seizure disorder and long history of smoking. The patient presented to the emergency Department in March of 2014 complaining of right shoulder pain from the scapula down to his right shoulder and anterior chest wound as well as right arm with swelling of his right eye. CT scan of the chest with contrast on 06/24/2012 showed an ill-defined low density pleural parenchymal thickening measuring 3.5 x 3.3 CM of unclear etiology but malignant process could not be excluded. The patient was seen by Dr. Dorris Fetch and a PET scan was performed on 07/04/2002 and it showed right superior sulcus tumor is identified. This measures 2.6 x 4.0 x 4.5 cm. The SUV max associated this mass is equal to 12.2. No hypermetabolic mediastinal or hilar adenopathy. Irregular nodular density in the left upper lobe is again noted measuring 8 mm. This has an SUV max equal to 0.9. MRI of the brain on 07/10/2012 showed no evidence for metastatic disease to the brain. On 07/10/2012 the patient underwent ultrasound-guided right apical lung mass fine needle aspiration by interventional radiology. The final cytology (Accession #: [id number]) showed malignant cells consistent with non-small cell carcinoma, favoring squamous cell carcinoma. Dr. Dorris Fetch kindly referred the patient to me today for evaluation and recommendation regarding treatment of his recently diagnosed right Pancoast tumor When seen today the patient continues to have pain at the right neck area as well as a right shoulder and down to the right arm and fingers. He denied having any significant shortness breath  or hemoptysis. Has cough with no sputum production. He lost around 20 pounds over the last 4 months. He denied having any significant visual changes or headache. His mother had cancer of unknown type. And he does not know the medical history of his father. The patient is married and has 4 biological children and several stepchildren. He was accompanied today by his wife plan and his stepdaughter. He is currently on disability but works for several companies in the past. He has a history of smoking one pack per day for around 40 years quit one month ago. He has no history of alcohol or drug abuse.  @SFHPI @  Past Medical History  Diagnosis Date  . HIV infection   . Seizures 03/04/12    Grandmal      No past surgical history on file.  Family History  Problem Relation Age of Onset  . Colon cancer Neg Hx     Social History History  Substance Use Topics  . Smoking status: Current Some Day Smoker -- 1.00 packs/day for 40 years    Types: Cigarettes  . Smokeless tobacco: Never Used  . Alcohol Use: No    No Known Allergies  Current Outpatient Prescriptions  Medication Sig Dispense Refill  . carbamazepine (TEGRETOL) 200 MG tablet take 3 tablets by mouth every morning 2 AT NOON and 3 tablets by mouth at bedtime  240 tablet  6  . efavirenz-emtricitabine-tenofovir (ATRIPLA) 600-200-300 MG per tablet Take 1 tablet by mouth at bedtime.      Marland Kitchen oxyCODONE-acetaminophen (PERCOCET) 5-325 MG per tablet Take 1 tablet by mouth every 6 (six) hours as needed for pain.  40 tablet  0  .  oxyCODONE-acetaminophen (PERCOCET) 5-325 MG per tablet Take 1 tablet by mouth every 6 (six) hours as needed for pain.  40 tablet  0  . PHENobarbital (LUMINAL) 64.8 MG tablet Take 1 tablet (64.8 mg total) by mouth at bedtime.  30 tablet  5  . prochlorperazine (COMPAZINE) 10 MG tablet Take 1 tablet (10 mg total) by mouth every 6 (six) hours as needed.  60 tablet  0   No current facility-administered medications for this  visit.    Review of Systems  A comprehensive review of systems was negative except for: Constitutional: positive for anorexia, fatigue and weight loss Respiratory: positive for cough and pleurisy/chest pain Musculoskeletal: positive for bone pain  Physical Exam  WUJ:WJXBJ, healthy, no distress, well nourished and well developed SKIN: skin color, texture, turgor are normal HEAD: Normocephalic EYES: normal EARS: External ears normal OROPHARYNX:no exudate and no erythema  NECK: supple, no adenopathy LYMPH:  no palpable lymphadenopathy LUNGS: clear to auscultation  HEART: regular rate & rhythm, no murmurs and no gallops ABDOMEN:abdomen soft, non-tender, normal bowel sounds and no masses or organomegaly BACK: Back symmetric, no curvature. EXTREMITIES:no joint deformities, effusion, or inflammation, no edema, no skin discoloration, no clubbing  NEURO: alert & oriented x 3 with fluent speech, no focal motor/sensory deficits  PERFORMANCE STATUS: ECOG 1  LABORATORY DATA: Lab Results  Component Value Date   WBC 3.1* 07/13/2012   HGB 12.9* 07/13/2012   HCT 36.5* 07/13/2012   MCV 93.6 07/13/2012   PLT 279 07/13/2012      Chemistry      Component Value Date/Time   NA 140 06/29/2012 0958   K 4.6 06/29/2012 0958   CL 106 06/29/2012 0958   CO2 27 06/29/2012 0958   BUN 17 06/29/2012 0958   CREATININE 1.03 06/29/2012 0958   CREATININE 1.00 06/24/2012 1751      Component Value Date/Time   CALCIUM 9.0 06/29/2012 0958   ALKPHOS 79 06/29/2012 0958   AST 25 06/29/2012 0958   ALT 14 06/29/2012 0958   BILITOT 0.3 06/29/2012 0958       RADIOGRAPHIC STUDIES: Dg Chest 1 View  07/13/2012  *RADIOLOGY REPORT*  Clinical Data: Status post right lung biopsy  CHEST - 1 VIEW  Comparison: PET CT dated 07/03/2012  Findings: No pneumothorax is seen following right lung biopsy.  Stable opacity at the medial right lung apex.  No pleural effusion.  The heart is normal in size  IMPRESSION: No pneumothorax is seen  following right lung biopsy.   Original Report Authenticated By: Charline Bills, M.D.    Ct Chest W Contrast  06/24/2012  *RADIOLOGY REPORT*  Clinical Data: Back pain  CT CHEST WITH CONTRAST  Technique:  Multidetector CT imaging of the chest was performed following the standard protocol during bolus administration of intravenous contrast.  Contrast: 80mL OMNIPAQUE IOHEXOL 300 MG/ML  SOLN  Comparison: Chest radiograph 01/25 1014  Findings: There is no axillary or supraclavicular lymphadenopathy. There are shotty bilateral axillary lymph nodes.  Shotty submental lymph nodes are incidentally imaged.  No supraclavicular adenopathy is evident.  At the right lung apex,  there is an ill-defined low density pleural parenchymal thickening measuring 3.5 x 3.3 cm which has Hounsfield units ranging from 10 to 28.  A vessel  courses through this tissue.  There is no mediastinal adenopathy.  No hilar adenopathy.  No pericardial fluid.  Esophagus is normal.  Review of the lung parenchyma demonstrates some mild interlobular septal thickening adjacent to the soft tissue irregular  density in the medial right upper lobe described above.  There is central lobular emphysema.  The right lower lobe there is a 4 mm pulmonary nodule (image 46).  There is a ill-defined nodule in the left upper lobe measuring 8 mm (image 22).  Limited view of the upper abdomen is unremarkable.  Limited view of the skeleton  IMPRESSION:  1.  Ill-defined mass-like relatively low density tissue at the medial right lung apex of unclear etiology. Cannot exclude malignant process.  Lesion is large enough such that FDG PET/CT scan would aid in assessing malignant potential.  2.  Scattered small pulmonary nodules are likely benign.  Recommend attention on follow-up. 3.  Emphysematous change.   Original Report Authenticated By: Genevive Bi, M.D.    Mr Laqueta Jean ZO Contrast  07/10/2012  *RADIOLOGY REPORT*  Clinical Data: Lung mass.  Evaluate for intracranial  metastatic disease.  MRI HEAD WITHOUT AND WITH CONTRAST  Technique:  Multiplanar, multiecho pulse sequences of the brain and surrounding structures were obtained according to standard protocol without and with intravenous contrast  Contrast: 14mL MULTIHANCE GADOBENATE DIMEGLUMINE 529 MG/ML IV SOLN  Comparison: CT head 03/26/2004.  PET scan 07/03/2012  Findings: The patient had difficulty remaining motionless for the study.  Images are suboptimal.  Small or subtle lesions could be overlooked.  There is no evidence for acute infarction, intracranial hemorrhage, mass lesion, hydrocephalus, or extra-axial fluid.  Mild atrophy. Chronic microvascular ischemic change affects the periventricular greater than subcortical white matter.  No foci of chronic hemorrhage.  No worrisome osseous lesions.  Mild cervical spondylosis.  Normal pituitary cerebellar tonsils.  Apparent sebaceous cyst of the right ear lobe.  Post infusion, no abnormal enhancement brain or meninges.  Post contrast coronal images are markedly degraded by motion.  Major intracranial vascular structures widely patent.  Negative orbits. No sinus or mastoid disease.  IMPRESSION: Motion degraded examination.  Mild atrophy and chronic microvascular ischemic change.  No definite foci of intracranial metastatic disease.  No acute intracranial findings.   Original Report Authenticated By: Davonna Belling, M.D.    Nm Pet Image Initial (pi) Skull Base To Thigh  07/03/2012  *RADIOLOGY REPORT*  Clinical Data: Initial treatment strategy for lung tumor.  NUCLEAR MEDICINE PET SKULL BASE TO THIGH  Fasting Blood Glucose:  93  Technique:  19 mCi F-18 FDG was injected intravenously. CT data was obtained and used for attenuation correction and anatomic localization only.  (This was not acquired as a diagnostic CT examination.) Additional exam technical data entered on technologist worksheet.  Comparison:  None  Findings:  Neck: No hypermetabolic lymph nodes in the neck.  Chest:   Right superior sulcus tumor is identified.  This measures 2.6 x 4.0 x 4.5 cm.  The SUV max associated this mass is equal to 12.2.  No hypermetabolic mediastinal or hilar adenopathy. Irregular nodular density in the left upper lobe is again noted measuring 8 mm, image 82.  This has an SUV max equal to 0.9.  Mild increased FDG uptake is associated with bilateral axillary lymph nodes which have a benign appearance containing fatty hilum. The SUV max within the left axilla is equal to 4.5, image 71.  FDG uptake within the right axilla has an SUV max equal to 3.1, image 65.  Abdomen/Pelvis:  No abnormal hypermetabolic activity within the liver, pancreas, adrenal glands, or spleen.  No hypermetabolic lymph nodes in the abdomen or pelvis. Right adrenal nodule is identified measuring 1.7 cm.  Likely benign adenoma.  Left adrenal gland is normal.  Skeleton:  No focal hypermetabolic activity to suggest skeletal metastasis.  Soft tissue attenuating nodule within the left buttock region is noted measuring 2.4 x 1.7 cm.  There is no FDG uptake within this nodule which likely represents a sebaceous cyst  IMPRESSION:  1.  Right superior sulcus tumor exhibits intense malignant range FDG uptake.  This is worrisome for primary bronchogenic carcinoma. Correlation with biopsy is advised. No evidence for distant metastatic disease.  2.  8 mm sub solid nodule in the left upper lobe exhibits low level, non malignant FDG uptake. 3.  Right adrenal nodule.  Likely benign adenoma.   Original Report Authenticated By: Signa Kell, M.D.    US Biopsy  07/13/2012  *RADIOLOGY REPORT*  Indication: Right-sided Pancoast tumor worrisome for bronchogenic carcinoma  ULTRASOUND GUIDED RIGHT APICAL LUNG MASS FINE NEEDLE ASPIRATION  Comparisons: PET CT - 07/03/2012; chest CT - 07/14/2012  Medications: Fentanyl 150 mcg IV; Versed 2.5 mg IV  Total Moderate Sedation Time: 25 minutes  Complications: None immediate  Findings / Technique:  Informed written  consent was obtained from the patient after a discussion of the risks, benefits and alternatives to treatment. Questions regarding the procedure were encouraged and answered. Initial ultrasound scanning demonstrated the mixed echogenic, largely hypoechoic infiltrative mass within the superior medial aspect of the right lung apex, correlating with the mass seen on preprocedural PET and chest CT.  An ultrasound image was saved for documentation purposes.  The procedure was planned.  A timeout was performed prior to the initiation of the procedure.  The operative was prepped and draped in the usual sterile fashion, and a sterile drape was applied covering the operative field.  A timeout was performed prior to the initiation of the procedure. Local anesthesia was provided with 1% lidocaine with epinephrine.  Under direct ultrasound guidance, 3 fine needle aspirates were obtained of the right apical mass with the use of a 22 Gauge Francine needle.  Multiple images were saved for documentation purposes.  Under direct ultrasound guidance, 3 fine needle aspirates were obtained of the right apical mass with the use of a 22 Gauge Francine needle.  Multiple images were saved for documentation purposes.  The samples were placed in saline and submitted to pathology. Quick stain pathologic review confirmed the acquisition of lesional tissue.  The needle was removed and hemostasis was achieved with manual compression.  Post procedure scan was negative for significant hematoma or additional complication.  A dressing was placed.  The patient tolerated the procedure well without immediate postprocedural complication. The patient was escorted to have an upright chest radiograph.  Impression:  Successful ultrasound guided right apical lung mass fine-needle aspiration.   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 The patient also has other medical comorbidities including HIV and history of recurrent seizure.   PLAN: I have a lengthy discussion with the patient and his family today about his disease stage, prognosis and treatment options. I recommended for the patient at treatment with a course of concurrent chemoradiation with weekly carboplatin for AUC of 2 and paclitaxel 45 mg/M2. This would be done for a period of 5-6 weeks followed by staging workup and referred for consideration of surgical resection as the patient has good response to this initial treatment.  I discussed with the patient adverse effect of the chemotherapy including but not limited to alopecia, myelosuppression, nausea and vomiting, peripheral neuropathy, liver or renal dysfunction. The patient would like to proceed with treatment as planned. He would have a chemotherapy education class given next week. I his pharmacy with prescription for Compazine 10 mg by mouth every 6 hours as needed for nausea. I will also give the patient prescription for Percocet 5/325 mg by mouth every 6 hours as needed for pain. He'll be seen by Dr. Kathrynn Running later today for evaluation and discussion of the radiotherapy option. I expect the patient to start the first cycle of this concurrent chemoradiation on 07/27/2012. He would come back for followup visit in 3 weeks for evaluation and management any adverse effect of his chemotherapy. The patient was seen by thoracic oncology the applicator as well as the social worker at the cancer Center for evaluation of his needs.  All questions were answered. The patient knows to call the clinic with any problems, questions or concerns. We can certainly see the patient much sooner if necessary.  Thank you so much for allowing me to participate in the care of Roger Walton. I will continue to follow up the patient with you and assist in  his care.  I spent 30 minutes counseling the patient face to face. The total time spent in the appointment was 60 minutes.  Landry Kamath K. 07/16/2012, 4:56 PM

## 2012-07-17 ENCOUNTER — Ambulatory Visit
Admission: RE | Admit: 2012-07-17 | Discharge: 2012-07-17 | Disposition: A | Discharge status: Home / Self Care | Payer: Medicare Other | Source: Ambulatory Visit | Attending: Radiation Oncology | Admitting: Radiation Oncology

## 2012-07-17 ENCOUNTER — Encounter: Payer: Self-pay | Admitting: Radiation Oncology

## 2012-07-17 ENCOUNTER — Encounter: Payer: Self-pay | Admitting: *Deleted

## 2012-07-17 DIAGNOSIS — R300 Dysuria: Secondary | ICD-10-CM | POA: Insufficient documentation

## 2012-07-17 DIAGNOSIS — R059 Cough, unspecified: Secondary | ICD-10-CM | POA: Insufficient documentation

## 2012-07-17 DIAGNOSIS — J029 Acute pharyngitis, unspecified: Secondary | ICD-10-CM | POA: Insufficient documentation

## 2012-07-17 DIAGNOSIS — M25519 Pain in unspecified shoulder: Secondary | ICD-10-CM | POA: Insufficient documentation

## 2012-07-17 DIAGNOSIS — R0602 Shortness of breath: Secondary | ICD-10-CM | POA: Insufficient documentation

## 2012-07-17 DIAGNOSIS — C3410 Malignant neoplasm of upper lobe, unspecified bronchus or lung: Secondary | ICD-10-CM | POA: Insufficient documentation

## 2012-07-17 DIAGNOSIS — R5381 Other malaise: Secondary | ICD-10-CM | POA: Insufficient documentation

## 2012-07-17 DIAGNOSIS — K117 Disturbances of salivary secretion: Secondary | ICD-10-CM | POA: Insufficient documentation

## 2012-07-17 DIAGNOSIS — L819 Disorder of pigmentation, unspecified: Secondary | ICD-10-CM | POA: Insufficient documentation

## 2012-07-17 DIAGNOSIS — Z510 Encounter for antineoplastic radiation therapy: Secondary | ICD-10-CM | POA: Insufficient documentation

## 2012-07-17 DIAGNOSIS — R05 Cough: Secondary | ICD-10-CM | POA: Insufficient documentation

## 2012-07-17 DIAGNOSIS — K209 Esophagitis, unspecified without bleeding: Secondary | ICD-10-CM | POA: Insufficient documentation

## 2012-07-17 HISTORY — DX: Pain in right shoulder: M25.511

## 2012-07-17 HISTORY — DX: Malignant neoplasm of unspecified part of unspecified bronchus or lung: C34.90

## 2012-07-17 HISTORY — DX: Other chronic pain: G89.29

## 2012-07-17 HISTORY — DX: Shortness of breath: R06.02

## 2012-07-17 HISTORY — DX: Anxiety disorder, unspecified: F41.9

## 2012-07-17 NOTE — Addendum Note (Signed)
Encounter addended by: Larenzo Caples Mintz Elwin Tsou, RN on: 07/17/2012  6:24 PM<BR>     Documentation filed: Charges VN

## 2012-07-17 NOTE — Progress Notes (Signed)
  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  Identity was confirmed.  All relevant records and images related to the planned course of therapy were reviewed.  The patient freely provided informed written consent to proceed with treatment after reviewing the details related to the planned course of therapy. The consent form was witnessed and verified by the simulation staff.  Then, the patient was set-up in a stable reproducible  supine position for radiation therapy.  CT images were obtained.  Surface markings were placed.  The CT images were loaded into the planning software.  Then the target and avoidance structures were contoured.  Treatment planning then occurred.  The radiation prescription was entered and confirmed.  Then, I designed and supervised the construction of a total of 6 medically necessary complex treatment devices including a custom thermoplastic positioning mask, and 5 MLC apertures.  I have requested : 3D Simulation  I have requested a DVH of the following structures: spinal cord, lungs, heart, esophagus and target..  I have ordered:Nutrition Consult  SPECIAL TREATMENT PROCEDURE NOTE:  Special care is required in the management of this patient for the following reasons.  I have requested : This treatment constitutes a Special Treatment Procedure for the following reason: [ Concurrent chemotherapy requiring careful monitoring for increased toxicities of treatment including weekly laboratory values.. The special nature of the planned course of radiotherapy will require increased physician supervision and oversight to ensure patient's safety with  optimal treatment outcomes.  PLAN:  The patient will receive 45 Gy in 25 fraction with chemo pre-op starting 4/7.  ________________________________  Artist Pais. Kathrynn Running, M.D.

## 2012-07-17 NOTE — Addendum Note (Signed)
Encounter addended by: Delynn Flavin, RN on: 07/17/2012  6:24 PM<BR>     Documentation filed: Charges VN

## 2012-07-17 NOTE — Progress Notes (Signed)
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 male with known HIV and most recent CD4 count of 160. He also has a 40-pack-year history of smoking. He was in his usual state of health until a couple of months ago when he started experiencing right shoulder pain. He also is noted some "swelling" in his right eye. A chest x-ray in January was unremarkable. He continued to have pain and a CT was done on 06/24/2012. It showed a right apical mass consistent with a Pancoast tumor. A PET CT then was done on 07/03/12 which showed this lesion to be hypermetabolic. There was no evidence of regional or distant metastases.  U/S guided biopsy showed squamous cell carcinoma of the lung. The patient was presented earlier today in our multidisciplinary thoracic oncology conference. He has kindly been referred today for consultation in the multidisciplinary thoracic oncology clinic   PREVIOUS RADIATION THERAPY: No  PAST MEDICAL HISTORY:  has a past medical history of Seizures (03/04/12); Lung cancer (07/13/12); HIV infection; SOB (shortness of breath); Chronic pain in right shoulder; and Anxiety.    PAST SURGICAL HISTORY:History reviewed. No pertinent past surgical history.  FAMILY HISTORY: family history is negative for Colon cancer.  SOCIAL HISTORY:  reports that he has been smoking Cigarettes.  He has a 40 pack-year smoking history. He has never used smokeless tobacco. He reports that he does not drink alcohol or use illicit drugs.  ALLERGIES: Review of  patient's allergies indicates no known allergies.  MEDICATIONS:  Current Outpatient Prescriptions  Medication Sig Dispense Refill  . carbamazepine (TEGRETOL) 200 MG tablet take 3 tablets by mouth every morning 2 AT NOON and 3 tablets by mouth at bedtime  240 tablet  6  . efavirenz-emtricitabine-tenofovir (ATRIPLA) 600-200-300 MG per tablet Take 1 tablet by mouth at bedtime.      Marland Kitchen oxyCODONE-acetaminophen (PERCOCET) 5-325 MG per tablet Take 1 tablet by mouth every 6 (six) hours as needed for pain.  40 tablet  0  . oxyCODONE-acetaminophen (PERCOCET) 5-325 MG per tablet Take 1 tablet by mouth every 6 (six) hours as needed for pain.  40 tablet  0  . PHENobarbital (LUMINAL) 64.8 MG tablet Take 1 tablet (64.8 mg total) by mouth at bedtime.  30 tablet  5  . prochlorperazine (COMPAZINE) 10 MG tablet Take 1 tablet (10 mg total) by mouth every 6 (six) hours as needed.  60 tablet  0   No current facility-administered medications for this encounter.    REVIEW OF SYSTEMS:  A 15 point review of systems is documented in the electronic medical record. This was obtained by the nursing staff. However, I reviewed this with the patient to discuss relevant findings and make appropriate changes.  A comprehensive review of systems was negative. other than constant right shoulder pain.He describes as starting in the scapula coming through the shoulder and then anteriorly into his anterior chest wall. He also has pain that runs down his right arm. He says the pain is severe and almost constant. Movement does worsen the pain but lack  of movement does not totally relieve the pain   PHYSICAL EXAM:  height is 5\' 9"  (1.753 m) and weight is 148 lb (67.132 kg). His oral temperature is 98.1 F (36.7 C). His blood pressure is 160/102 and his pulse is 90. His respiration is 20.   Per thoracic surgery He is oriented to person, place, and time. He appears well-developed and well-nourished. No distress.  HENT:  Head: Normocephalic  and atraumatic.  Eyes: EOM are normal.  Right ptosis and miosis  Neck: Neck supple. No thyromegaly present.  Cardiovascular: Normal rate, regular rhythm, normal heart sounds and intact distal pulses. Exam reveals no friction rub.  No murmur heard.  Pulmonary/Chest: Effort normal and breath sounds normal. He has no wheezes. He has no rales. He exhibits tenderness (right shoulder and upper chest).  Abdominal: Soft. There is no tenderness.  Musculoskeletal: Normal range of motion. He exhibits no edema.  Lymphadenopathy:  He has no cervical adenopathy.  Neurological: He is alert and oriented to person, place, and time. A cranial nerve deficit (right Horner's syndrome) is present.  Skin: Skin is warm and dry.  Psychiatric: He has a normal mood and affect.    LABORATORY DATA:  Lab Results  Component Value Date   WBC 3.1* 07/13/2012   HGB 12.9* 07/13/2012   HCT 36.5* 07/13/2012   MCV 93.6 07/13/2012   PLT 279 07/13/2012   Lab Results  Component Value Date   NA 140 06/29/2012   K 4.6 06/29/2012   CL 106 06/29/2012   CO2 27 06/29/2012   Lab Results  Component Value Date   ALT 14 06/29/2012   AST 25 06/29/2012   ALKPHOS 79 06/29/2012   BILITOT 0.3 06/29/2012     RADIOGRAPHY: Dg Chest 1 View  07/13/2012  *RADIOLOGY REPORT*  Clinical Data: Status post right lung biopsy  CHEST - 1 VIEW  Comparison: PET CT dated 07/03/2012  Findings: No pneumothorax is seen following right lung biopsy.  Stable opacity at the medial right lung apex.  No pleural effusion.  The heart is normal in size  IMPRESSION: No pneumothorax is seen following right lung biopsy.   Original Report Authenticated By: Charline Bills, M.D.    Ct Chest W Contrast  06/24/2012  *RADIOLOGY REPORT*  Clinical Data: Back pain  CT CHEST WITH CONTRAST  Technique:  Multidetector CT imaging of the chest was performed following the standard protocol during bolus administration of intravenous contrast.  Contrast: 80mL OMNIPAQUE IOHEXOL 300 MG/ML   SOLN  Comparison: Chest radiograph 01/25 1014  Findings: There is no axillary or supraclavicular lymphadenopathy. There are shotty bilateral axillary lymph nodes.  Shotty submental lymph nodes are incidentally imaged.  No supraclavicular adenopathy is evident.  At the right lung apex,  there is an ill-defined low density pleural parenchymal thickening measuring 3.5 x 3.3 cm which has Hounsfield units ranging from 10 to 28.  A vessel  courses through this tissue.  There is no mediastinal adenopathy.  No hilar adenopathy.  No pericardial fluid.  Esophagus is normal.  Review of the lung parenchyma demonstrates some mild interlobular septal thickening adjacent to the soft tissue irregular density in the medial right upper lobe described above.  There is central lobular emphysema.  The right lower lobe there is a 4 mm pulmonary nodule (image 46).  There is a ill-defined nodule in the left upper lobe measuring 8 mm (image 22).  Limited view of the upper abdomen is unremarkable.  Limited view of the skeleton  IMPRESSION:  1.  Ill-defined mass-like relatively low density tissue at the medial right lung apex of unclear etiology. Cannot exclude malignant process.  Lesion is large enough such that FDG PET/CT scan would aid in assessing malignant potential.  2.  Scattered small pulmonary nodules are likely benign.  Recommend attention on follow-up. 3.  Emphysematous change.   Original Report Authenticated By: Genevive Bi, M.D.    Mr Laqueta Jean NW Contrast  07/10/2012  *RADIOLOGY REPORT*  Clinical Data: Lung mass.  Evaluate for intracranial metastatic disease.  MRI HEAD WITHOUT AND WITH CONTRAST  Technique:  Multiplanar, multiecho pulse sequences of the brain and surrounding structures were obtained according to standard protocol without and with intravenous contrast  Contrast: 14mL MULTIHANCE GADOBENATE DIMEGLUMINE 529 MG/ML IV SOLN  Comparison: CT head 03/26/2004.  PET scan 07/03/2012  Findings: The patient had difficulty  remaining motionless for the study.  Images are suboptimal.  Small or subtle lesions could be overlooked.  There is no evidence for acute infarction, intracranial hemorrhage, mass lesion, hydrocephalus, or extra-axial fluid.  Mild atrophy. Chronic microvascular ischemic change affects the periventricular greater than subcortical white matter.  No foci of chronic hemorrhage.  No worrisome osseous lesions.  Mild cervical spondylosis.  Normal pituitary cerebellar tonsils.  Apparent sebaceous cyst of the right ear lobe.  Post infusion, no abnormal enhancement brain or meninges.  Post contrast coronal images are markedly degraded by motion.  Major intracranial vascular structures widely patent.  Negative orbits. No sinus or mastoid disease.  IMPRESSION: Motion degraded examination.  Mild atrophy and chronic microvascular ischemic change.  No definite foci of intracranial metastatic disease.  No acute intracranial findings.   Original Report Authenticated By: Davonna Belling, M.D.    Nm Pet Image Initial (pi) Skull Base To Thigh  07/03/2012  *RADIOLOGY REPORT*  Clinical Data: Initial treatment strategy for lung tumor.  NUCLEAR MEDICINE PET SKULL BASE TO THIGH  Fasting Blood Glucose:  93  Technique:  19 mCi F-18 FDG was injected intravenously. CT data was obtained and used for attenuation correction and anatomic localization only.  (This was not acquired as a diagnostic CT examination.) Additional exam technical data entered on technologist worksheet.  Comparison:  None  Findings:  Neck: No hypermetabolic lymph nodes in the neck.  Chest:  Right superior sulcus tumor is identified.  This measures 2.6 x 4.0 x 4.5 cm.  The SUV max associated this mass is equal to 12.2.  No hypermetabolic mediastinal or hilar adenopathy. Irregular nodular density in the left upper lobe is again noted measuring 8 mm, image 82.  This has an SUV max equal to 0.9.  Mild increased FDG uptake is associated with bilateral axillary lymph nodes which  have a benign appearance containing fatty hilum. The SUV max within the left axilla is equal to 4.5, image 71.  FDG uptake within the right axilla has an SUV max equal to 3.1, image 65.  Abdomen/Pelvis:  No abnormal hypermetabolic activity within the liver, pancreas, adrenal glands, or spleen.  No hypermetabolic lymph nodes in the abdomen or pelvis. Right adrenal nodule is identified measuring 1.7 cm.  Likely benign adenoma.  Left adrenal gland is normal.  Skeleton:  No focal hypermetabolic activity to suggest skeletal metastasis.  Soft tissue attenuating nodule within the left buttock region is noted measuring 2.4 x 1.7 cm.  There is no FDG uptake within this nodule which likely represents a sebaceous cyst  IMPRESSION:  1.  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  This is worrisome for primary bronchogenic carcinoma. Correlation with biopsy is advised. No evidence for distant metastatic disease.  2.  8 mm sub solid nodule in the left upper lobe exhibits low level, non malignant FDG uptake. 3.  Right adrenal nodule.  Likely benign adenoma.   Original Report Authenticated By: Signa Kell, M.D.    US Biopsy  07/13/2012  *RADIOLOGY REPORT*  Indication: Right-sided Pancoast tumor worrisome for bronchogenic carcinoma  ULTRASOUND GUIDED RIGHT APICAL LUNG MASS FINE NEEDLE ASPIRATION  Comparisons: PET CT - 07/03/2012; chest CT - 07/14/2012  Medications: Fentanyl 150 mcg IV; Versed 2.5 mg IV  Total Moderate Sedation Time: 25 minutes  Complications: None immediate  Findings / Technique:  Informed written consent was obtained from the patient after a discussion of the risks, benefits and alternatives to treatment. Questions regarding the procedure were encouraged and answered. Initial ultrasound scanning demonstrated the mixed echogenic, largely hypoechoic infiltrative mass within the superior medial aspect of the right lung apex, correlating with the mass seen on preprocedural PET and  chest CT.  An ultrasound image was saved for documentation purposes.  The procedure was planned.  A timeout was performed prior to the initiation of the procedure.  The operative was prepped and draped in the usual sterile fashion, and a sterile drape was applied covering the operative field.  A timeout was performed prior to the initiation of the procedure. Local anesthesia was provided with 1% lidocaine with epinephrine.  Under direct ultrasound guidance, 3 fine needle aspirates were obtained of the right apical mass with the use of a 22 Gauge Francine needle.  Multiple images were saved for documentation purposes.  Under direct ultrasound guidance, 3 fine needle aspirates were obtained of the right apical mass with the use of a 22 Gauge Francine needle.  Multiple images were saved for documentation purposes.  The samples were placed in saline and submitted to pathology. Quick stain pathologic review confirmed the acquisition of lesional tissue.  The needle was removed and hemostasis was achieved with manual compression.  Post procedure scan was negative for significant hematoma or additional complication.  A dressing was placed.  The patient tolerated the procedure well without immediate postprocedural complication. The patient was escorted to have an upright chest radiograph.  Impression:  Successful ultrasound guided right apical lung mass fine-needle aspiration.   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  His disease shows no evidence of metastatic involvement to regional lymph nodes or distant sites. The patient may benefit from surgical resection, however definitive resection may not be feasible given the Pancoast symptoms suggesting nerve involvement. As such, the patient may benefit from preoperative chemoradiotherapy to  facilitate surgical resection.  Some literature suggests that patients who are HIV positive with CD4 count of less than 200 suffer increased or exaggerated toxicities related to radiation such as increased dermatitis and/or esophagitis.  PLAN: Today, I talked patient about the findings and workup thus far. We talked about the role of radiation therapy management of non-small cell lung cancer. We talked with the rationale for preoperative chemoradiotherapy prior to resection for Pancoast tumor. We discussed the pros and cons of delivering preoperative radiation. We talked about the potential complications related to radiation.   I spent 60 minutes minutes face to face with the patient and  more than 50% of that time was spent in counseling and/or coordination of care.  The patient would like to proceed with preoperative chemoradiotherapy with consideration of right upper lobectomy 6-8 weeks following radiation depending on his clinical and radiographic response. The patient will require careful attention for the potentially exaggerated toxicities related HIV infection in patients undergoing radiation therapy.   ------------------------------------------------  Artist Pais. Kathrynn Running, M.D.

## 2012-07-17 NOTE — Progress Notes (Signed)
[facility] New Consult, Right Lung ca  Dx biopsy 07/13/12=non-smal cell Ca favor squamous cell\ Patient brought over via w/c, c/o 5 pain on 1-10 pain scale ,  right shoulder when he tries to close his hand and c/o neck pain, took his percocet this am but stated"that pain medicine doesn't help at all, no coughing, appetite good, drink plenty fluids stated, patient instructed to register this Friday upstairs get a pager and come downstairs when pager goes off and a staff member will be waiting there for them,.wife and patient gave verbal understanding, patient given schedule by Katie,therapist,coffee offered, some sob with activirty,fatuigued 9:52 AM  9:52 AM

## 2012-07-18 ENCOUNTER — Emergency Department (HOSPITAL_COMMUNITY)
Admission: EM | Admit: 2012-07-18 | Discharge: 2012-07-18 | Disposition: A | Discharge status: Home / Self Care | Payer: Medicare Other | Attending: Emergency Medicine | Admitting: Emergency Medicine

## 2012-07-18 ENCOUNTER — Emergency Department (HOSPITAL_COMMUNITY): Payer: Medicare Other

## 2012-07-18 ENCOUNTER — Encounter (HOSPITAL_COMMUNITY): Payer: Self-pay | Admitting: *Deleted

## 2012-07-18 DIAGNOSIS — F17200 Nicotine dependence, unspecified, uncomplicated: Secondary | ICD-10-CM | POA: Insufficient documentation

## 2012-07-18 DIAGNOSIS — F411 Generalized anxiety disorder: Secondary | ICD-10-CM | POA: Insufficient documentation

## 2012-07-18 DIAGNOSIS — R0789 Other chest pain: Secondary | ICD-10-CM

## 2012-07-18 DIAGNOSIS — G40909 Epilepsy, unspecified, not intractable, without status epilepticus: Secondary | ICD-10-CM | POA: Insufficient documentation

## 2012-07-18 DIAGNOSIS — G8929 Other chronic pain: Secondary | ICD-10-CM | POA: Insufficient documentation

## 2012-07-18 DIAGNOSIS — R071 Chest pain on breathing: Secondary | ICD-10-CM | POA: Insufficient documentation

## 2012-07-18 DIAGNOSIS — Z21 Asymptomatic human immunodeficiency virus [HIV] infection status: Secondary | ICD-10-CM | POA: Insufficient documentation

## 2012-07-18 DIAGNOSIS — M25519 Pain in unspecified shoulder: Secondary | ICD-10-CM | POA: Insufficient documentation

## 2012-07-18 DIAGNOSIS — Z79899 Other long term (current) drug therapy: Secondary | ICD-10-CM | POA: Insufficient documentation

## 2012-07-18 DIAGNOSIS — C3490 Malignant neoplasm of unspecified part of unspecified bronchus or lung: Secondary | ICD-10-CM | POA: Insufficient documentation

## 2012-07-18 LAB — CBC
MCH: 33.5 pg (ref 26.0–34.0)
MCHC: 36.6 g/dL — ABNORMAL HIGH (ref 30.0–36.0)
Platelets: 287 10*3/uL (ref 150–400)
RDW: 13.3 % (ref 11.5–15.5)

## 2012-07-18 LAB — BASIC METABOLIC PANEL
Calcium: 9.2 mg/dL (ref 8.4–10.5)
GFR calc Af Amer: >90 mL/min (ref >90)
GFR calc non Af Amer: >90 mL/min (ref >90)
Glucose, Bld: 112 mg/dL — ABNORMAL HIGH (ref 70–99)
Sodium: 137 mEq/L (ref 135–145)

## 2012-07-18 LAB — POCT I-STAT TROPONIN I

## 2012-07-18 MED ORDER — OXYCODONE-ACETAMINOPHEN 5-325 MG PO TABS
2.0000 | ORAL_TABLET | Freq: Once | ORAL | Status: AC
  Administered 2012-07-18: 2 via ORAL
  Filled 2012-07-18: qty 2

## 2012-07-18 MED ORDER — IBUPROFEN 600 MG PO TABS: 600.0000 mg | ORAL_TABLET | Freq: Three times a day (TID) | ORAL | Status: DC | PRN

## 2012-07-18 NOTE — ED Notes (Addendum)
Reports having right side chest pains constant for extended amount of time, radiates into his back. Currently being tx for lung ca. ekg being done at triage, no acute distress noted. Recently started on percocet but its not helping his pain.

## 2012-07-18 NOTE — ED Notes (Signed)
Pt stated his wife was driving him home.

## 2012-07-18 NOTE — ED Provider Notes (Signed)
History     CSN: [id number]  Arrival date & time 07/18/12  1515   First MD Initiated Contact with Patient 07/18/12 1917      Chief Complaint  Patient presents with  . Chest Pain    (Consider location/radiation/quality/duration/timing/severity/associated sxs/prior treatment) The history is provided by the patient.   patient reports constant pain in his right shoulder and chest region of the past several days.  No shortness of breath.  No fevers or chills.  The patient is currently getting rate start radiation for non-small cell lung cancer.  His pain in his right chest is worse with movement of his arm and with palpation of his right pectoral muscle.  No recent injury.  No recent weight lifting.  The patient has a history of HIV and is currently compliant with all his medications.  He follows with the infectious disease team.  His pain is mild to moderate in severity at this time  Past Medical History  Diagnosis Date  . 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 HIV infection   . SOB (shortness of breath)   . Chronic pain in right shoulder     scapula and arm for 2 months   . Anxiety     History reviewed. No pertinent past surgical history.  Family History  Problem Relation Age of Onset  . Colon cancer Neg Hx     History  Substance Use Topics  . Smoking status: Current Some Day Smoker -- 1.00 packs/day for 40 years    Types: Cigarettes  . Smokeless tobacco: Never Used     Comment: 07/03/12 states pt  . Alcohol Use: No      Review of Systems  Cardiovascular: Positive for chest pain.  All other systems reviewed and are negative.    Allergies  Review of patient's allergies indicates no known allergies.  Home Medications   Current Outpatient Rx  Name  Route  Sig  Dispense  Refill  . carbamazepine (TEGRETOL) 200 MG tablet      take 3 tablets by mouth every morning 2 AT NOON and 3 tablets by mouth  at bedtime   240 tablet   6   . efavirenz-emtricitabine-tenofovir (ATRIPLA) 600-200-300 MG per tablet   Oral   Take 1 tablet by mouth at bedtime.         Marland Kitchen oxyCODONE-acetaminophen (PERCOCET) 5-325 MG per tablet   Oral   Take 1 tablet by mouth every 6 (six) hours as needed for pain.   40 tablet   0   . PHENobarbital (LUMINAL) 64.8 MG tablet   Oral   Take 1 tablet (64.8 mg total) by mouth at bedtime.   30 tablet   5   . prochlorperazine (COMPAZINE) 10 MG tablet   Oral   Take 10 mg by mouth every 6 (six) hours as needed.           BP 161/95  Pulse 87  Temp(Src) 98.3 F (36.8 C) (Oral)  Resp 16  SpO2 99%  Physical Exam  Nursing note and vitals reviewed. Constitutional: He is oriented to person, place, and time. He appears well-developed and well-nourished.  HENT:  Head: Normocephalic and atraumatic.  Eyes: EOM are normal.  Neck: Normal range of motion.  Cardiovascular: Normal rate, regular rhythm, normal heart sounds and intact distal pulses.   Pulmonary/Chest: Effort normal and breath sounds normal. No respiratory distress.  Tenderness of right pectoral muscle without erythema.  No axillary nodes noted.  No rash.  Abdominal: Soft. He exhibits no distension. There is no tenderness.  Musculoskeletal: Normal range of motion.  Neurological: He is alert and oriented to person, place, and time.  Skin: Skin is warm and dry.  Psychiatric: He has a normal mood and affect. Judgment normal.    ED Course  Procedures (including critical care time)   Date: 07/18/2012  Rate: 89  Rhythm: normal sinus rhythm  QRS Axis: normal  Intervals: normal  ST/T Wave abnormalities: normal  Conduction Disutrbances: none  Narrative Interpretation:   Old EKG Reviewed: No significant changes noted     Labs Reviewed  CBC - Abnormal; Notable for the following:    WBC 3.6 (*)    RBC 4.18 (*)    HCT 38.2 (*)    MCHC 36.6 (*)    All other components within normal limits  BASIC  METABOLIC PANEL - Abnormal; Notable for the following:    Glucose, Bld 112 (*)    All other components within normal limits  POCT I-STAT TROPONIN I   Dg Chest 2 View  07/18/2012  *RADIOLOGY REPORT*  Clinical Data: Chest pain. Right apical lung cancer.  Recent biopsy of the right apical lesion.  CHEST - 2 VIEW  Comparison: 07/13/2012  Findings: Two views of the chest were obtained.  The lesion in the medial right lung apex appears unchanged.  There is no evidence for a pneumothorax.  Heart and mediastinum are stable.  No evidence for pleural effusions.  IMPRESSION: No acute chest findings.  Stable appearance of the medial right apical lesion.   Original Report Authenticated By: Richarda Overlie, M.D.    I personally reviewed the imaging tests through PACS system I reviewed available ER/hospitalization records through the EMR   1. Chest wall pain       MDM  Patient's pain seems to be localized his right pectoral region.  No signs of infection.  No obvious lymph nodes.  Discharge home in good condition.  PCP followup.  Already on  oxycodone for pain.        Lyanne Co, MD 07/18/12 [phone]

## 2012-07-20 ENCOUNTER — Telehealth: Payer: Self-pay | Admitting: *Deleted

## 2012-07-20 ENCOUNTER — Ambulatory Visit: Payer: Medicare Other | Admitting: Infectious Diseases

## 2012-07-20 ENCOUNTER — Telehealth: Payer: Self-pay | Admitting: Internal Medicine

## 2012-07-20 NOTE — Telephone Encounter (Signed)
Called and notified patient that he no showed his appt today. He will check his schedule due to multiple MD appts and call back to reschedule. Roger Walton

## 2012-07-20 NOTE — Telephone Encounter (Signed)
S/w pt's wife this morning re next appt for 4/2 and informed her that pt is to get schedule when he comes in. Per wife she is unsure if they can make appts because they do not have a way of getting here. Wife informed that I would let the social worker know to see if there is any way pt can pt helped with transportation. lmonvm for Lauren and Systems analyst. Also emailed both social workers and Secretary/administrator and MM.

## 2012-07-22 ENCOUNTER — Other Ambulatory Visit: Payer: Medicare Other

## 2012-07-22 ENCOUNTER — Encounter: Payer: Self-pay | Admitting: *Deleted

## 2012-07-22 ENCOUNTER — Telehealth: Payer: Self-pay | Admitting: *Deleted

## 2012-07-22 MED ORDER — MORPHINE SULFATE ER 15 MG PO TBCR: 15.0000 mg | EXTENDED_RELEASE_TABLET | Freq: Two times a day (BID) | ORAL | Status: DC

## 2012-07-22 NOTE — Telephone Encounter (Signed)
Pt is in pt education stating that he is taking his percocet 2 tabs q6h and needs ibuprofen rx refilled that was given to him when he was in the ED and needs a refill on both.  Informed pt that the ibuprofen rx was prescribed 1 tablet q6h prn and he should not be taking 2 as it is not prescribed this way.  Per Dr Donnald Garre, okay to start pt on morphine 15mg  BID and he can get ibuprofen OTC, he does not need a rx for it.  Pt still had approx 10-12 tablets left of percocet, informed him to call if he needs refill by Friday and we can refill it at that time.  Pt and family member verbalized understanding.  SLJ

## 2012-07-23 ENCOUNTER — Encounter: Payer: Self-pay | Admitting: *Deleted

## 2012-07-24 ENCOUNTER — Ambulatory Visit
Admission: RE | Admit: 2012-07-24 | Discharge: 2012-07-24 | Disposition: A | Discharge status: Home / Self Care | Payer: Medicare Other | Source: Ambulatory Visit | Attending: Radiation Oncology | Admitting: Radiation Oncology

## 2012-07-24 DIAGNOSIS — F40240 Claustrophobia: Secondary | ICD-10-CM

## 2012-07-24 DIAGNOSIS — F4323 Adjustment disorder with mixed anxiety and depressed mood: Secondary | ICD-10-CM

## 2012-07-24 MED ORDER — LORAZEPAM 1 MG PO TABS: 1.0000 mg | ORAL_TABLET | Freq: Three times a day (TID) | ORAL | Status: DC

## 2012-07-24 MED ORDER — LORAZEPAM 1 MG PO TABS
1.0000 mg | ORAL_TABLET | Freq: Once | ORAL | Status: AC
  Administered 2012-07-24: 1 mg via ORAL
  Filled 2012-07-24: qty 1

## 2012-07-24 NOTE — Progress Notes (Signed)
  Radiation Oncology         [phone] ________________________________  Name: Roger Walton MRN: [id number]  Date: 07/24/2012  DOB: 1955-08-25  Simulation Verification Note  Status: outpatient  NARRATIVE: The patient was brought to the treatment unit and placed in the planned treatment position. The clinical setup was verified. Then port films were obtained and uploaded to the radiation oncology medical record software.  The treatment beams were carefully compared against the planned radiation fields. The position location and shape of the radiation fields was reviewed. They targeted volume of tissue appears to be appropriately covered by the radiation beams. Organs at risk appear to be excluded as planned.  Based on my personal review, I approved the simulation verification. The patient's treatment will proceed as planned.  ------------------------------------------------  Artist Pais Kathrynn Running, M.D.

## 2012-07-24 NOTE — Progress Notes (Signed)
Roger Walton called from [facility] stating"pateint cannot ly with mask on, in pain and needs something to relax", asked patient name and dob as identification, he stated both and sid"I have to have something to relax or I cannot ly on that hard table with the mask on ,causea pain in my neck too", Per Dr.MAnning, okay to give 1mg  ativan po, verified with Renaldo Reel RN ativan 1mg  tablet, gave to patient, patient also given Rx for Ativan 1mg  po q 8hours prn anxiety, instructed pateiunt he can rx to his pharmacy or our [facility] pt pharmacy, patient placed rx paper in his right jean  Pants pants pocket .no

## 2012-07-27 ENCOUNTER — Ambulatory Visit (HOSPITAL_BASED_OUTPATIENT_CLINIC_OR_DEPARTMENT_OTHER): Payer: Medicaid Other

## 2012-07-27 ENCOUNTER — Other Ambulatory Visit (HOSPITAL_BASED_OUTPATIENT_CLINIC_OR_DEPARTMENT_OTHER): Payer: Medicare Other

## 2012-07-27 ENCOUNTER — Ambulatory Visit (HOSPITAL_BASED_OUTPATIENT_CLINIC_OR_DEPARTMENT_OTHER): Payer: Medicare Other | Admitting: Physician Assistant

## 2012-07-27 ENCOUNTER — Encounter: Payer: Self-pay | Admitting: Physician Assistant

## 2012-07-27 ENCOUNTER — Ambulatory Visit: Admission: RE | Admit: 2012-07-27 | Payer: Medicare Other | Source: Ambulatory Visit

## 2012-07-27 ENCOUNTER — Telehealth: Payer: Self-pay | Admitting: Internal Medicine

## 2012-07-27 DIAGNOSIS — C3410 Malignant neoplasm of upper lobe, unspecified bronchus or lung: Secondary | ICD-10-CM

## 2012-07-27 DIAGNOSIS — Z5111 Encounter for antineoplastic chemotherapy: Secondary | ICD-10-CM

## 2012-07-27 DIAGNOSIS — C3490 Malignant neoplasm of unspecified part of unspecified bronchus or lung: Secondary | ICD-10-CM

## 2012-07-27 DIAGNOSIS — B370 Candidal stomatitis: Secondary | ICD-10-CM

## 2012-07-27 LAB — COMPREHENSIVE METABOLIC PANEL (CC13)
ALT: 20 U/L (ref 0–55)
AST: 27 U/L (ref 5–34)
Chloride: 100 mEq/L (ref 98–107)
Creatinine: 0.9 mg/dL (ref 0.7–1.3)
Total Bilirubin: 0.42 mg/dL (ref 0.20–1.20)

## 2012-07-27 LAB — CBC WITH DIFFERENTIAL/PLATELET
BASO%: 0.5 % (ref 0.0–2.0)
EOS%: 1 % (ref 0.0–7.0)
HCT: 39.2 % (ref 38.4–49.9)
LYMPH%: 22.7 % (ref 14.0–49.0)
MCH: 33.1 pg (ref 27.2–33.4)
MCHC: 34.7 g/dL (ref 32.0–36.0)
NEUT%: 63.6 % (ref 39.0–75.0)
lymph#: 0.9 10*3/uL (ref 0.9–3.3)

## 2012-07-27 MED ORDER — SODIUM CHLORIDE 0.9 % IV SOLN
230.0000 mg | Freq: Once | INTRAVENOUS | Status: AC
  Administered 2012-07-27: 230 mg via INTRAVENOUS
  Filled 2012-07-27: qty 23

## 2012-07-27 MED ORDER — ONDANSETRON 16 MG/50ML IVPB (CHCC)
16.0000 mg | Freq: Once | INTRAVENOUS | Status: AC
  Administered 2012-07-27: 16 mg via INTRAVENOUS

## 2012-07-27 MED ORDER — FAMOTIDINE IN NACL 20-0.9 MG/50ML-% IV SOLN
20.0000 mg | Freq: Once | INTRAVENOUS | Status: AC
  Administered 2012-07-27: 20 mg via INTRAVENOUS

## 2012-07-27 MED ORDER — SODIUM CHLORIDE 0.9 % IV SOLN
Freq: Once | INTRAVENOUS | Status: AC
  Administered 2012-07-27: 13:00:00 via INTRAVENOUS

## 2012-07-27 MED ORDER — OXYCODONE-ACETAMINOPHEN 5-325 MG PO TABS: 1.0000 | ORAL_TABLET | Freq: Four times a day (QID) | ORAL | Status: DC | PRN

## 2012-07-27 MED ORDER — NYSTATIN 100000 UNIT/ML MT SUSP: OROMUCOSAL | Status: DC

## 2012-07-27 MED ORDER — DEXAMETHASONE SODIUM PHOSPHATE 4 MG/ML IJ SOLN
20.0000 mg | Freq: Once | INTRAMUSCULAR | Status: AC
  Administered 2012-07-27: 20 mg via INTRAVENOUS

## 2012-07-27 MED ORDER — SODIUM CHLORIDE 0.9 % IV SOLN
45.0000 mg/m2 | Freq: Once | INTRAVENOUS | Status: AC
  Administered 2012-07-27: 84 mg via INTRAVENOUS
  Filled 2012-07-27: qty 14

## 2012-07-27 MED ORDER — DIPHENHYDRAMINE HCL 50 MG/ML IJ SOLN
50.0000 mg | Freq: Once | INTRAMUSCULAR | Status: AC
  Administered 2012-07-27: 50 mg via INTRAVENOUS

## 2012-07-27 NOTE — Patient Instructions (Addendum)
[facility] Discharge Instructions for Patients Receiving Chemotherapy  Today you received the following chemotherapy agents Taxol and Carboplatin.  To help prevent nausea and vomiting after your treatment, we encourage you to take your nausea medication as prescribed.   If you develop nausea and vomiting that is not controlled by your nausea medication, call the clinic. If it is after clinic hours your family physician or the after hours number for the clinic or go to the Emergency Department.   BELOW ARE SYMPTOMS THAT SHOULD BE REPORTED IMMEDIATELY:  *FEVER GREATER THAN 100.5 F  *CHILLS WITH OR WITHOUT FEVER  NAUSEA AND VOMITING THAT IS NOT CONTROLLED WITH YOUR NAUSEA MEDICATION  *UNUSUAL SHORTNESS OF BREATH  *UNUSUAL BRUISING OR BLEEDING  TENDERNESS IN MOUTH AND THROAT WITH OR WITHOUT PRESENCE OF ULCERS  *URINARY PROBLEMS  *BOWEL PROBLEMS  UNUSUAL RASH Items with * indicate a potential emergency and should be followed up as soon as possible.  One of the nurses will contact you 24 hours after your treatment. Please let the nurse know about any problems that you may have experienced. Feel free to call the clinic you have any questions or concerns. The clinic phone number is [phone].   I have been informed and understand all the instructions given to me. I know to contact the clinic, my physician, or go to the Emergency Department if any problems should occur. I do not have any questions at this time, but understand that I may call the clinic during office hours   should I have any questions or need assistance in obtaining follow up care.    __________________________________________  _____________  __________ Signature of Patient or Authorized Representative            Date                   Time    __________________________________________ Nurse's Signature    

## 2012-07-27 NOTE — Patient Instructions (Addendum)
Use the nystatin as instructed for the thrush in her mouth Continue with your course of concurrent chemoradiation as scheduled Followup in one week for a symptom management visit

## 2012-07-28 ENCOUNTER — Other Ambulatory Visit: Payer: Self-pay | Admitting: Radiation Oncology

## 2012-07-28 ENCOUNTER — Ambulatory Visit
Admission: RE | Admit: 2012-07-28 | Discharge: 2012-07-28 | Disposition: A | Discharge status: Home / Self Care | Payer: Medicare Other | Source: Ambulatory Visit | Attending: Radiation Oncology | Admitting: Radiation Oncology

## 2012-07-28 ENCOUNTER — Telehealth: Payer: Self-pay | Admitting: *Deleted

## 2012-07-28 ENCOUNTER — Encounter: Payer: Self-pay | Admitting: *Deleted

## 2012-07-28 DIAGNOSIS — C7951 Secondary malignant neoplasm of bone: Secondary | ICD-10-CM

## 2012-07-28 DIAGNOSIS — B20 Human immunodeficiency virus [HIV] disease: Secondary | ICD-10-CM

## 2012-07-28 MED ORDER — MORPHINE SULFATE 10 MG/ML IJ SOLN
10.0000 mg | Freq: Once | INTRAMUSCULAR | Status: DC
  Filled 2012-07-28: qty 1

## 2012-07-28 MED ORDER — MORPHINE SULFATE 10 MG/ML IJ SOLN
INTRAMUSCULAR | Status: AC
  Filled 2012-07-28: qty 1

## 2012-07-28 MED ORDER — MORPHINE SULFATE 10 MG/ML IJ SOLN
10.0000 mg | Freq: Once | INTRAMUSCULAR | Status: AC
  Administered 2012-07-28: 10 mg via INTRAMUSCULAR
  Filled 2012-07-28: qty 1

## 2012-07-28 NOTE — Progress Notes (Addendum)
[name] is seen today on the treatment table, unable to still because of severe shoulder pain. He took Ativan prior to his arrival, but is too uncomfortable to lie down for his treatment. I'm giving him 10 mg of morphine IM.

## 2012-07-28 NOTE — Progress Notes (Signed)
Patient re-assessed for pain, pain level a 7 from 10, states patient, drowsy sitting in w/c waiting for rad tx to right shoulder , vitals done 98.6,134/81,83,16 95% room air sats 4:23 PM

## 2012-07-28 NOTE — Progress Notes (Signed)
Dressing change right mammosite, 2 drops serosangous  On dressing, cleansed area with normal saline ,around incsion site of catheter, then applied neosporin around incision site, applied 4x4 drain sponge over site, 4x4 dressing gause and abd dressing on top of dressing,secured with mesh tubing, and patient pink tube top, patient tolerated weill, no redness around incision or warmth noted earlier before dressing change, no c/o pain 4:28 PM

## 2012-07-28 NOTE — Progress Notes (Signed)
Verbal orders to give 10mg  morphiner IM, verifed with Nile Dear gasve name and dob, pain a 10 on 1-10 scale stated patient, took ativan thia am and this afternoon stated, by patient,last pain medication stated taken this morning,wife in room with patient

## 2012-07-28 NOTE — Telephone Encounter (Signed)
Called patient and spoke with his wife Roger Walton.  She says she is doing well with no n/v.  Denies any problems with bowels or bladder.  Says he is getting ready to come in for radiation  [name] denies questions.  Asked that she tell him of this call and to call if any questions.

## 2012-07-28 NOTE — Telephone Encounter (Signed)
Message copied by Augusto Garbe on Tue Jul 28, 2012  1:47 PM ------      Message from: Lenn Sink I      Created: Mon Jul 27, 2012  3:55 PM      Regarding: 1st chemo       First time Taxol and Carboplatin. Dr. Arbutus Ped. No reaction. ------

## 2012-07-29 ENCOUNTER — Ambulatory Visit
Admission: RE | Admit: 2012-07-29 | Discharge: 2012-07-29 | Disposition: A | Discharge status: Home / Self Care | Payer: Medicare Other | Source: Ambulatory Visit | Attending: Radiation Oncology | Admitting: Radiation Oncology

## 2012-07-29 ENCOUNTER — Encounter: Payer: Self-pay | Admitting: *Deleted

## 2012-07-29 VITALS — BP 134/81 | HR 83 | Temp 98.6°F | Resp 16

## 2012-07-29 DIAGNOSIS — C7951 Secondary malignant neoplasm of bone: Secondary | ICD-10-CM

## 2012-07-29 MED ORDER — MORPHINE SULFATE 10 MG/ML IJ SOLN
10.0000 mg | Freq: Once | INTRAMUSCULAR | Status: AC
  Administered 2012-07-29: 10 mg via INTRAMUSCULAR
  Filled 2012-07-29: qty 1

## 2012-07-29 NOTE — Progress Notes (Signed)
Patient c/o pain right shoulder requesting same pain medication given yesterday ,Morphine  10mg  IM , okay per Dr.MAnning, Verified morphine 10mg  with Val Malloy,rn,patient gave name dob as identification, , gave 10mg  im morphine left deltoid, pain on a 1-10 scale states"5" now, last percocet taken today at 1500, ativan taken at 130pm, took his ms contin, will address pain meds with MD and let patient know tomorrow if any pain med changes can be made 3:56 PM

## 2012-07-29 NOTE — Progress Notes (Signed)
Previous progness  Note dated 07/28/12, not a mamosite patient, done in erroneous error  3:52 PM

## 2012-07-30 ENCOUNTER — Ambulatory Visit
Admission: RE | Admit: 2012-07-30 | Discharge: 2012-07-30 | Disposition: A | Discharge status: Home / Self Care | Payer: Medicare Other | Source: Ambulatory Visit | Attending: Radiation Oncology | Admitting: Radiation Oncology

## 2012-07-30 DIAGNOSIS — C7952 Secondary malignant neoplasm of bone marrow: Secondary | ICD-10-CM

## 2012-07-30 DIAGNOSIS — C7951 Secondary malignant neoplasm of bone: Secondary | ICD-10-CM

## 2012-07-30 MED ORDER — MORPHINE SULFATE 4 MG/ML IJ SOLN
4.0000 mg | Freq: Once | INTRAMUSCULAR | Status: AC
  Administered 2012-07-30: 4 mg via INTRAMUSCULAR
  Filled 2012-07-30: qty 1

## 2012-07-30 NOTE — Progress Notes (Addendum)
Patient arrived for  Rad tx, in w/c, stated  Right shoulder pain a "5" on a 1-10 pain scale, per Dr.Manning, 4mg  Morphine verified with Val Malloy,Rn opatient gave name and dob as identification,,  4mg  Morphine  Im given x1,stated he only took his ativan today, reminded him he has percocet prn pain breakthrough, can take before he leaves home tomorrow , wife will not be with patient tomoorw, he will be coming by [facility], to call them [phone] after tx 3:46 PM

## 2012-07-30 NOTE — Progress Notes (Signed)
Reassessed patient pain after rad ts right shoulder,"It helped somewhat", then patient stood up off table,steady gait, and started dancing,laughing, assisted patient to w/c, emphasized him to take his percocet tomorrow right before coming for his next rad tx,patient stated he would 4:26 PM

## 2012-07-31 ENCOUNTER — Ambulatory Visit
Admission: RE | Admit: 2012-07-31 | Discharge: 2012-07-31 | Disposition: A | Discharge status: Home / Self Care | Payer: Medicare Other | Source: Ambulatory Visit | Attending: Radiation Oncology | Admitting: Radiation Oncology

## 2012-07-31 ENCOUNTER — Encounter: Payer: Self-pay | Admitting: Radiation Oncology

## 2012-07-31 VITALS — BP 137/87 | HR 78 | Temp 99.5°F | Resp 20 | Wt 145.0 lb

## 2012-07-31 DIAGNOSIS — C7951 Secondary malignant neoplasm of bone: Secondary | ICD-10-CM

## 2012-07-31 DIAGNOSIS — C7952 Secondary malignant neoplasm of bone marrow: Secondary | ICD-10-CM

## 2012-07-31 MED ORDER — MORPHINE SULFATE 4 MG/ML IJ SOLN
4.0000 mg | Freq: Once | INTRAMUSCULAR | Status: AC
  Administered 2012-07-31: 4 mg via INTRAMUSCULAR
  Filled 2012-07-31: qty 1

## 2012-07-31 NOTE — Progress Notes (Signed)
Patient arrived via w/c to room 11 at nursing, alert,orieented x3, c/o pain right shoulder a 5 on 1-10 scale, verified name and dob, Samantha presnell,RN verified Morphine 4mg  , gave 4mg  morphine IM in patients left deltoid, eating well, fatigued, no nausea or vomitin, no coughing, here before tx rad to right shoulder 2:45 PM'

## 2012-07-31 NOTE — Progress Notes (Signed)
Patient taken off table with Roger Walton therapist helped patient to w/c,getting dresed, he voiced no pain 3:56 PM

## 2012-08-01 NOTE — Progress Notes (Signed)
No images are attached to the encounter. No scans are attached to the encounter. No scans are attached to the encounter. [facility] OFFICE PROGRESS NOTE  Roger Sax, MD [address]  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 male returns for a scheduled regular symptom management visit for followup of his stage IIB/IIIA non-small cell lung cancer, squamous cell carcinoma. He complains of shooting pains down his right arm related to his known right Pancoast tumor. He is receiving radiation therapy under the care of Dr. Kathrynn Running. He was taking ibuprofen 600 mg three times daily in addition to his MS Contin and Percocet. He requests a refill for the ibuprofen and Percocet. He voiced no other complaints today.  MEDICAL HISTORY: Past Medical History  Diagnosis Date  . 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 HIV infection   . SOB (shortness of breath)   . Chronic pain in right shoulder     scapula and arm for 2 months   . Anxiety     ALLERGIES:  has No Known Allergies.  MEDICATIONS:  Current Outpatient Prescriptions  Medication Sig Dispense Refill  . carbamazepine (TEGRETOL) 200 MG tablet take 3 tablets by mouth every morning 2 AT NOON and 3 tablets by mouth at bedtime  240 tablet  6  . efavirenz-emtricitabine-tenofovir (ATRIPLA) 600-200-300 MG per tablet Take 1 tablet by mouth at bedtime.      Marland Kitchen ibuprofen (ADVIL,MOTRIN) 600 MG tablet Take 1 tablet (600 mg total) by mouth every 8 (eight) hours as needed for pain.  15 tablet  0  . LORazepam (ATIVAN) 1 MG tablet Take 1 tablet (1 mg total) by mouth every 8  (eight) hours.  30 tablet  0  . LORazepam (ATIVAN) 1 MG tablet Take 1 tablet (1 mg total) by mouth every 8 (eight) hours.  30 tablet  0  . morphine (MS CONTIN) 15 MG 12 hr tablet Take 1 tablet (15 mg total) by mouth 2 (two) times daily.  60 tablet  0  . nystatin (MYCOSTATIN) 100000 UNIT/ML suspension 5 ml swish and spit four times a day  180 mL  0  . oxyCODONE-acetaminophen (PERCOCET) 5-325 MG per tablet Take 1 tablet by mouth every 6 (six) hours as needed for pain.  40 tablet  0  . PHENobarbital (LUMINAL) 64.8 MG tablet Take 1 tablet (64.8 mg total) by mouth at bedtime.  30 tablet  5  . prochlorperazine (COMPAZINE) 10 MG tablet Take 10 mg by mouth every 6 (six) hours as needed.       No current facility-administered medications for this visit.    SURGICAL HISTORY: History reviewed. No pertinent past surgical history.  REVIEW OF SYSTEMS:  A comprehensive review of systems was negative except for: Musculoskeletal: positive for right arm pain related to the right Pancoast tumor   PHYSICAL EXAMINATION: General appearance: alert, cooperative, appears stated age and no distress Head: Normocephalic, without obvious abnormality, atraumatic Neck: no adenopathy, no carotid bruit, no JVD, supple, symmetrical, trachea midline and thyroid not enlarged, symmetric, no tenderness/mass/nodules Lymph nodes: Cervical, supraclavicular, and axillary nodes normal. Resp: clear to auscultation  bilaterally Cardio: regular rate and rhythm, S1, S2 normal, no murmur, click, rub or gallop GI: soft, non-tender; bowel sounds normal; no masses,  no organomegaly Extremities: extremities normal, atraumatic, no cyanosis or edema Neurologic: Alert and oriented X 3, normal strength and tone. Normal symmetric reflexes. Normal coordination and gait Oropharynx: reveals thrush  ECOG PERFORMANCE STATUS: 1 - Symptomatic but completely ambulatory  Blood pressure 143/90, pulse 96, temperature 98.9 F (37.2 C), temperature source  Oral, resp. rate 20, height 5\' 9"  (1.753 m), weight 145 lb 6.4 oz (65.953 kg).  LABORATORY DATA: Lab Results  Component Value Date   WBC 4.1 07/27/2012   HGB 13.6 07/27/2012   HCT 39.2 07/27/2012   MCV 95.4 07/27/2012   PLT 256 07/27/2012      Chemistry      Component Value Date/Time   NA 135* 07/27/2012 1004   NA 137 07/18/2012 1759   K 3.8 07/27/2012 1004   K 3.9 07/18/2012 1759   CL 100 07/27/2012 1004   CL 101 07/18/2012 1759   CO2 24 07/27/2012 1004   CO2 27 07/18/2012 1759   BUN 15.9 07/27/2012 1004   BUN 10 07/18/2012 1759   CREATININE 0.9 07/27/2012 1004   CREATININE 0.85 07/18/2012 1759   CREATININE 1.03 06/29/2012 0958      Component Value Date/Time   CALCIUM 9.1 07/27/2012 1004   CALCIUM 9.2 07/18/2012 1759   ALKPHOS 100 07/27/2012 1004   ALKPHOS 79 06/29/2012 0958   AST 27 07/27/2012 1004   AST 25 06/29/2012 0958   ALT 20 07/27/2012 1004   ALT 14 06/29/2012 0958   BILITOT 0.42 07/27/2012 1004   BILITOT 0.3 06/29/2012 0958       RADIOGRAPHIC STUDIES:  Dg Chest 1 View  07/13/2012  *RADIOLOGY REPORT*  Clinical Data: Status post right lung biopsy  CHEST - 1 VIEW  Comparison: PET CT dated 07/03/2012  Findings: No pneumothorax is seen following right lung biopsy.  Stable opacity at the medial right lung apex.  No pleural effusion.  The heart is normal in size  IMPRESSION: No pneumothorax is seen following right lung biopsy.   Original Report Authenticated By: Charline Bills, M.D.    Dg Chest 2 View  07/18/2012  *RADIOLOGY REPORT*  Clinical Data: Chest pain. Right apical lung cancer.  Recent biopsy of the right apical lesion.  CHEST - 2 VIEW  Comparison: 07/13/2012  Findings: Two views of the chest were obtained.  The lesion in the medial right lung apex appears unchanged.  There is no evidence for a pneumothorax.  Heart and mediastinum are stable.  No evidence for pleural effusions.  IMPRESSION: No acute chest findings.  Stable appearance of the medial right apical lesion.   Original Report  Authenticated By: Richarda Overlie, M.D.    Mr Laqueta Jean ZO Contrast  07/10/2012  *RADIOLOGY REPORT*  Clinical Data: Lung mass.  Evaluate for intracranial metastatic disease.  MRI HEAD WITHOUT AND WITH CONTRAST  Technique:  Multiplanar, multiecho pulse sequences of the brain and surrounding structures were obtained according to standard protocol without and with intravenous contrast  Contrast: 14mL MULTIHANCE GADOBENATE DIMEGLUMINE 529 MG/ML IV SOLN  Comparison: CT head 03/26/2004.  PET scan 07/03/2012  Findings: The patient had difficulty remaining motionless for the study.  Images are suboptimal.  Small or subtle lesions could be overlooked.  There is no evidence for acute infarction, intracranial hemorrhage, mass lesion, hydrocephalus, or extra-axial fluid.  Mild atrophy. Chronic microvascular ischemic change affects the periventricular greater  than subcortical white matter.  No foci of chronic hemorrhage.  No worrisome osseous lesions.  Mild cervical spondylosis.  Normal pituitary cerebellar tonsils.  Apparent sebaceous cyst of the right ear lobe.  Post infusion, no abnormal enhancement brain or meninges.  Post contrast coronal images are markedly degraded by motion.  Major intracranial vascular structures widely patent.  Negative orbits. No sinus or mastoid disease.  IMPRESSION: Motion degraded examination.  Mild atrophy and chronic microvascular ischemic change.  No definite foci of intracranial metastatic disease.  No acute intracranial findings.   Original Report Authenticated By: Davonna Belling, M.D.    Nm Pet Image Initial (pi) Skull Base To Thigh  07/03/2012  *RADIOLOGY REPORT*  Clinical Data: Initial treatment strategy for lung tumor.  NUCLEAR MEDICINE PET SKULL BASE TO THIGH  Fasting Blood Glucose:  93  Technique:  19 mCi F-18 FDG was injected intravenously. CT data was obtained and used for attenuation correction and anatomic localization only.  (This was not acquired as a diagnostic CT examination.)  Additional exam technical data entered on technologist worksheet.  Comparison:  None  Findings:  Neck: No hypermetabolic lymph nodes in the neck.  Chest:  Right superior sulcus tumor is identified.  This measures 2.6 x 4.0 x 4.5 cm.  The SUV max associated this mass is equal to 12.2.  No hypermetabolic mediastinal or hilar adenopathy. Irregular nodular density in the left upper lobe is again noted measuring 8 mm, image 82.  This has an SUV max equal to 0.9.  Mild increased FDG uptake is associated with bilateral axillary lymph nodes which have a benign appearance containing fatty hilum. The SUV max within the left axilla is equal to 4.5, image 71.  FDG uptake within the right axilla has an SUV max equal to 3.1, image 65.  Abdomen/Pelvis:  No abnormal hypermetabolic activity within the liver, pancreas, adrenal glands, or spleen.  No hypermetabolic lymph nodes in the abdomen or pelvis. Right adrenal nodule is identified measuring 1.7 cm.  Likely benign adenoma.  Left adrenal gland is normal.  Skeleton:  No focal hypermetabolic activity to suggest skeletal metastasis.  Soft tissue attenuating nodule within the left buttock region is noted measuring 2.4 x 1.7 cm.  There is no FDG uptake within this nodule which likely represents a sebaceous cyst  IMPRESSION:  1.  Right superior sulcus tumor exhibits intense malignant range FDG uptake.  This is worrisome for primary bronchogenic carcinoma. Correlation with biopsy is advised. No evidence for distant metastatic disease.  2.  8 mm sub solid nodule in the left upper lobe exhibits low level, non malignant FDG uptake. 3.  Right adrenal nodule.  Likely benign adenoma.   Original Report Authenticated By: Signa Kell, M.D.    US Biopsy  07/13/2012  *RADIOLOGY REPORT*  Indication: Right-sided Pancoast tumor worrisome for bronchogenic carcinoma  ULTRASOUND GUIDED RIGHT APICAL LUNG MASS FINE NEEDLE ASPIRATION  Comparisons: PET CT - 07/03/2012; chest CT - 07/14/2012   Medications: Fentanyl 150 mcg IV; Versed 2.5 mg IV  Total Moderate Sedation Time: 25 minutes  Complications: None immediate  Findings / Technique:  Informed written consent was obtained from the patient after a discussion of the risks, benefits and alternatives to treatment. Questions regarding the procedure were encouraged and answered. Initial ultrasound scanning demonstrated the mixed echogenic, largely hypoechoic infiltrative mass within the superior medial aspect of the right lung apex, correlating with the mass seen on preprocedural PET and chest CT.  An ultrasound image was saved for documentation  purposes.  The procedure was planned.  A timeout was performed prior to the initiation of the procedure.  The operative was prepped and draped in the usual sterile fashion, and a sterile drape was applied covering the operative field.  A timeout was performed prior to the initiation of the procedure. Local anesthesia was provided with 1% lidocaine with epinephrine.  Under direct ultrasound guidance, 3 fine needle aspirates were obtained of the right apical mass with the use of a 22 Gauge Francine needle.  Multiple images were saved for documentation purposes.  Under direct ultrasound guidance, 3 fine needle aspirates were obtained of the right apical mass with the use of a 22 Gauge Francine needle.  Multiple images were saved for documentation purposes.  The samples were placed in saline and submitted to pathology. Quick stain pathologic review confirmed the acquisition of lesional tissue.  The needle was removed and hemostasis was achieved with manual compression.  Post procedure scan was negative for significant hematoma or additional complication.  A dressing was placed.  The patient tolerated the procedure well without immediate postprocedural complication. The patient was escorted to have an upright chest radiograph.  Impression:  Successful ultrasound guided right apical lung mass fine-needle aspiration.    Original Report Authenticated By: Tacey Ruiz, MD      ASSESSMENT/PLAN: The patient is a very pleasant 57 year old African American male recently diagnosed with stage IIB/IIIA non-small cell lung cell lung cancer, squamous cell carcinoma presenting as a right Pancoast tumor. He has other medical comorbidities including HIV and a history of recurrent seizure. He is currently undergoing a course of concurrent chemoradiation with weekly carboplatin for AUC of 2 and paclitaxel 45 mg/M2. The patient was discussed with Dr. Arbutus Ped. He will continue with his course of concurrent chemoradiation as scheduled. We will discontinue the ibuprofen due to the potential negative effect on the platelet count. A prescription for Nystatin to address the oral candidiasis was sent to his pharmacy of record via [facility]. He was given a prescription for Percocet 5/325 mg tablets, a total of 40 tablets with no refill for breakthrough pain. He will continue taking his MS Contin 15 mg by mouth every 8 hours.    Laural Benes, Savonna Birchmeier E, PA-C     All questions were answered. The patient knows to call the clinic with any problems, questions or concerns. We can certainly see the patient much sooner if necessary.  I spent 20 minutes counseling the patient face to face. The total time spent in the appointment was 30 minutes.

## 2012-08-02 ENCOUNTER — Encounter: Payer: Self-pay | Admitting: Radiation Oncology

## 2012-08-02 NOTE — Progress Notes (Signed)
  Radiation Oncology         [phone] ________________________________  Name: Roger Walton MRN: [id number]  Date: 07/31/2012  DOB: 16-Jul-1955  Weekly Radiation Therapy Management  Current Dose: 5.4 Gy     Planned Dose:  45 Gy  Narrative . . . . . . . . The patient presents for routine under treatment assessment.                                           The patient is still suffering with pain. Patient arrived prior to radiation via w/c to room 11 at nursing, alert,orieented x3, c/o pain right shoulder a 5 on 1-10 scale, verified name and dob, Samantha presnell,RN verified Morphine 4mg  , gave 4mg  morphine IM in patients left deltoid, eating well, fatigued, no nausea or vomitin, no coughing, here before tx rad to right shoulder.  His pain has responded to daily morphine prior to radiation.                                 Set-up films were reviewed.                                 The chart was checked. Physical Findings. . .  weight is 145 lb (65.772 kg). His oral temperature is 99.5 F (37.5 C). His blood pressure is 137/87 and his pulse is 78. His respiration is 20. . Weight essentially stable.  No significant changes. Impression . . . . . . . The patient is tolerating radiation. Plan . . . . . . . . . . . . Continue treatment as planned.  ________________________________  Artist Pais. Kathrynn Running, M.D.

## 2012-08-03 ENCOUNTER — Ambulatory Visit: Payer: Medicare Other

## 2012-08-03 ENCOUNTER — Other Ambulatory Visit: Payer: Self-pay | Admitting: Internal Medicine

## 2012-08-03 ENCOUNTER — Other Ambulatory Visit (HOSPITAL_BASED_OUTPATIENT_CLINIC_OR_DEPARTMENT_OTHER): Payer: Medicare Other | Admitting: Lab

## 2012-08-03 ENCOUNTER — Ambulatory Visit (HOSPITAL_BASED_OUTPATIENT_CLINIC_OR_DEPARTMENT_OTHER): Payer: Medicare Other

## 2012-08-03 DIAGNOSIS — C3490 Malignant neoplasm of unspecified part of unspecified bronchus or lung: Secondary | ICD-10-CM

## 2012-08-03 DIAGNOSIS — C3410 Malignant neoplasm of upper lobe, unspecified bronchus or lung: Secondary | ICD-10-CM

## 2012-08-03 DIAGNOSIS — Z5111 Encounter for antineoplastic chemotherapy: Secondary | ICD-10-CM

## 2012-08-03 LAB — COMPREHENSIVE METABOLIC PANEL (CC13)
AST: 20 U/L (ref 5–34)
Albumin: 2.7 g/dL — ABNORMAL LOW (ref 3.5–5.0)
BUN: 16.7 mg/dL (ref 7.0–26.0)
Calcium: 8.8 mg/dL (ref 8.4–10.4)
Chloride: 104 mEq/L (ref 98–107)
Creatinine: 0.9 mg/dL (ref 0.7–1.3)
Glucose: 109 mg/dl — ABNORMAL HIGH (ref 70–99)
Potassium: 4.2 mEq/L (ref 3.5–5.1)

## 2012-08-03 LAB — CBC WITH DIFFERENTIAL/PLATELET
Basophils Absolute: 0 10*3/uL (ref 0.0–0.1)
Eosinophils Absolute: 0 10*3/uL (ref 0.0–0.5)
HCT: 38.1 % — ABNORMAL LOW (ref 38.4–49.9)
HGB: 12.9 g/dL — ABNORMAL LOW (ref 13.0–17.1)
LYMPH%: 24.1 % (ref 14.0–49.0)
MCV: 98.3 fL — ABNORMAL HIGH (ref 79.3–98.0)
MONO#: 0.4 10*3/uL (ref 0.1–0.9)
MONO%: 9.5 % (ref 0.0–14.0)
NEUT#: 2.4 10*3/uL (ref 1.5–6.5)
NEUT%: 64.8 % (ref 39.0–75.0)
Platelets: 297 10*3/uL (ref 140–400)
RBC: 3.88 10*6/uL — ABNORMAL LOW (ref 4.20–5.82)
WBC: 3.7 10*3/uL — ABNORMAL LOW (ref 4.0–10.3)

## 2012-08-03 MED ORDER — DEXAMETHASONE SODIUM PHOSPHATE 4 MG/ML IJ SOLN
20.0000 mg | Freq: Once | INTRAMUSCULAR | Status: AC
  Administered 2012-08-03: 20 mg via INTRAVENOUS

## 2012-08-03 MED ORDER — FAMOTIDINE IN NACL 20-0.9 MG/50ML-% IV SOLN
20.0000 mg | Freq: Once | INTRAVENOUS | Status: AC
  Administered 2012-08-03: 20 mg via INTRAVENOUS

## 2012-08-03 MED ORDER — SODIUM CHLORIDE 0.9 % IV SOLN: Freq: Once | INTRAVENOUS | Status: DC

## 2012-08-03 MED ORDER — PACLITAXEL CHEMO INJECTION 300 MG/50ML
45.0000 mg/m2 | Freq: Once | INTRAVENOUS | Status: AC
  Administered 2012-08-03: 84 mg via INTRAVENOUS
  Filled 2012-08-03: qty 14

## 2012-08-03 MED ORDER — DIPHENHYDRAMINE HCL 50 MG/ML IJ SOLN
50.0000 mg | Freq: Once | INTRAMUSCULAR | Status: AC
  Administered 2012-08-03: 50 mg via INTRAVENOUS

## 2012-08-03 MED ORDER — ONDANSETRON 16 MG/50ML IVPB (CHCC)
16.0000 mg | Freq: Once | INTRAVENOUS | Status: AC
  Administered 2012-08-03: 16 mg via INTRAVENOUS

## 2012-08-03 MED ORDER — SODIUM CHLORIDE 0.9 % IV SOLN
227.6000 mg | Freq: Once | INTRAVENOUS | Status: AC
  Administered 2012-08-03: 230 mg via INTRAVENOUS
  Filled 2012-08-03: qty 23

## 2012-08-03 NOTE — Patient Instructions (Addendum)
[facility] Discharge Instructions for Patients Receiving Chemotherapy  Today you received the following chemotherapy agents: taxol, carboplatin  To help prevent nausea and vomiting after your treatment, we encourage you to take your nausea medication.  Take it as often as prescribed.     If you develop nausea and vomiting that is not controlled by your nausea medication, call the clinic. If it is after clinic hours your family physician or the after hours number for the clinic or go to the Emergency Department.   BELOW ARE SYMPTOMS THAT SHOULD BE REPORTED IMMEDIATELY:  *FEVER GREATER THAN 100.5 F  *CHILLS WITH OR WITHOUT FEVER  NAUSEA AND VOMITING THAT IS NOT CONTROLLED WITH YOUR NAUSEA MEDICATION  *UNUSUAL SHORTNESS OF BREATH  *UNUSUAL BRUISING OR BLEEDING  TENDERNESS IN MOUTH AND THROAT WITH OR WITHOUT PRESENCE OF ULCERS  *URINARY PROBLEMS  *BOWEL PROBLEMS  UNUSUAL RASH Items with * indicate a potential emergency and should be followed up as soon as possible.  Feel free to call the clinic you have any questions or concerns. The clinic phone number is [phone].   I have been informed and understand all the instructions given to me. I know to contact the clinic, my physician, or go to the Emergency Department if any problems should occur. I do not have any questions at this time, but understand that I may call the clinic during office hours   should I have any questions or need assistance in obtaining follow up care.    __________________________________________  _____________  __________ Signature of Patient or Authorized Representative            Date                   Time    __________________________________________ Nurse's Signature    

## 2012-08-04 ENCOUNTER — Ambulatory Visit
Admission: RE | Admit: 2012-08-04 | Discharge: 2012-08-04 | Disposition: A | Discharge status: Home / Self Care | Payer: Medicare Other | Source: Ambulatory Visit | Attending: Radiation Oncology | Admitting: Radiation Oncology

## 2012-08-04 MED ORDER — MORPHINE SULFATE 4 MG/ML IJ SOLN
4.0000 mg | Freq: Once | INTRAMUSCULAR | Status: AC
  Administered 2012-08-04: 4 mg via INTRAMUSCULAR
  Filled 2012-08-04: qty 1

## 2012-08-05 ENCOUNTER — Ambulatory Visit (HOSPITAL_BASED_OUTPATIENT_CLINIC_OR_DEPARTMENT_OTHER): Payer: Medicare Other | Admitting: Physician Assistant

## 2012-08-05 ENCOUNTER — Other Ambulatory Visit (HOSPITAL_BASED_OUTPATIENT_CLINIC_OR_DEPARTMENT_OTHER): Payer: Medicare Other | Admitting: Lab

## 2012-08-05 ENCOUNTER — Ambulatory Visit
Admission: RE | Admit: 2012-08-05 | Discharge: 2012-08-05 | Disposition: A | Discharge status: Home / Self Care | Payer: Medicare Other | Source: Ambulatory Visit | Attending: Radiation Oncology | Admitting: Radiation Oncology

## 2012-08-05 ENCOUNTER — Encounter: Payer: Self-pay | Admitting: Radiation Oncology

## 2012-08-05 DIAGNOSIS — C3411 Malignant neoplasm of upper lobe, right bronchus or lung: Secondary | ICD-10-CM

## 2012-08-05 DIAGNOSIS — C3490 Malignant neoplasm of unspecified part of unspecified bronchus or lung: Secondary | ICD-10-CM

## 2012-08-05 DIAGNOSIS — C3410 Malignant neoplasm of upper lobe, unspecified bronchus or lung: Secondary | ICD-10-CM

## 2012-08-05 LAB — COMPREHENSIVE METABOLIC PANEL (CC13)
ALT: 12 U/L (ref 0–55)
AST: 19 U/L (ref 5–34)
Albumin: 2.8 g/dL — ABNORMAL LOW (ref 3.5–5.0)
BUN: 13.3 mg/dL (ref 7.0–26.0)
Calcium: 9.3 mg/dL (ref 8.4–10.4)
Chloride: 101 mEq/L (ref 98–107)
Potassium: 4.4 mEq/L (ref 3.5–5.1)

## 2012-08-05 LAB — CBC WITH DIFFERENTIAL/PLATELET
BASO%: 0.7 % (ref 0.0–2.0)
Basophils Absolute: 0 10*3/uL (ref 0.0–0.1)
EOS%: 0.4 % (ref 0.0–7.0)
HGB: 12.7 g/dL — ABNORMAL LOW (ref 13.0–17.1)
MCH: 33.7 pg — ABNORMAL HIGH (ref 27.2–33.4)
NEUT#: 2.3 10*3/uL (ref 1.5–6.5)
RDW: 13.2 % (ref 11.0–14.6)
lymph#: 0.6 10*3/uL — ABNORMAL LOW (ref 0.9–3.3)

## 2012-08-05 MED ORDER — MORPHINE SULFATE 4 MG/ML IJ SOLN
4.0000 mg | Freq: Once | INTRAMUSCULAR | Status: AC
  Administered 2012-08-05: 4 mg via INTRAMUSCULAR
  Filled 2012-08-05: qty 1

## 2012-08-05 MED ORDER — LORAZEPAM 1 MG PO TABS: 1.0000 mg | ORAL_TABLET | Freq: Three times a day (TID) | ORAL | Status: DC | PRN

## 2012-08-05 MED ORDER — OXYCODONE-ACETAMINOPHEN 5-325 MG PO TABS: 1.0000 | ORAL_TABLET | ORAL | Status: DC | PRN

## 2012-08-05 NOTE — Progress Notes (Signed)
Per Dr Broadus John order gave opt Morphine 4 mg IM, left deltoid. He states when he swallows his pain in his throat/upper mid chest is 10/10. He states after receiving Morphine his pain lowers to 2/10. Notified Anna, RT, linac #1 pt has received Morphine injection.

## 2012-08-05 NOTE — Progress Notes (Signed)
Patient presents to the clinic today accompanied by his wife for a PUT with Dr. Kathrynn Running. Patient is alert and oriented to person, place, and time. No distress noted. Patient being pushed in wheelchair. Pleasant affect noted. Patient reports right scapula pain 8 on a scale of 0-10 following morphine injection. Patient reports a dry cough that began this morning. Also, patient reports difficulty swallowing that began today. Patient goes on to explain it "feels like I have a lump in my throat when I swallow." No thick white coating noted on patient's tongue and no ulcerations noted in mouth. Patient denies shortness of breath. 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 Patient reports, "I have been getting lots of rest." Patient denies nausea, vomiting, headache or dizziness. Patient requesting refill of ativan 1 mg by mouth every eight hours. Patient requesting this be call into [facility] at [facility]. Patient reports night sweats. Reported all findings to Dr. Kathrynn Running.

## 2012-08-05 NOTE — Patient Instructions (Addendum)
Continue weekly labs and concurrent chemoradiation as scheduled Followup with Dr. Arbutus Ped in 2 weeks for another symptom management visit

## 2012-08-05 NOTE — Progress Notes (Signed)
  Radiation Oncology         [phone] ________________________________  Name: Roger Walton MRN: [id number]  Date: 08/05/2012  DOB: December 12, 1955  Weekly Radiation Therapy Management  Current Dose: 10.8 Gy     Planned Dose:  45 Gy  Narrative . . . . . . . . The patient presents for routine under treatment assessment.                                                 The patient has continued pain in the shoulder                                 Set-up films were reviewed.                                 The chart was checked. Physical Findings. . .  weight is 148 lb 9.6 oz (67.405 kg). His oral temperature is 97 F (36.1 C). His blood pressure is 131/78 and his pulse is 88. His respiration is 16 and oxygen saturation is 100%. . Weight essentially stable.  No significant changes. Impression . . . . . . . The patient is  tolerating radiation. Plan . . . . . . . . . . . . Continue treatment as planned.  I refilled oxy/apap and ativan.  ________________________________  Artist Pais. Kathrynn Running, M.D.

## 2012-08-06 ENCOUNTER — Telehealth: Payer: Self-pay | Admitting: *Deleted

## 2012-08-06 ENCOUNTER — Ambulatory Visit
Admission: RE | Admit: 2012-08-06 | Discharge: 2012-08-06 | Disposition: A | Discharge status: Home / Self Care | Payer: Medicare Other | Source: Ambulatory Visit | Attending: Radiation Oncology | Admitting: Radiation Oncology

## 2012-08-06 MED ORDER — MORPHINE SULFATE 4 MG/ML IJ SOLN
4.0000 mg | Freq: Once | INTRAMUSCULAR | Status: AC
  Administered 2012-08-06: 4 mg via INTRAMUSCULAR
  Filled 2012-08-06: qty 1

## 2012-08-06 NOTE — Telephone Encounter (Signed)
Per staff message and POF I have scheduled appts.  JMW  

## 2012-08-07 ENCOUNTER — Ambulatory Visit
Admission: RE | Admit: 2012-08-07 | Discharge: 2012-08-07 | Disposition: A | Discharge status: Home / Self Care | Payer: Medicare Other | Source: Ambulatory Visit | Attending: Radiation Oncology | Admitting: Radiation Oncology

## 2012-08-07 MED ORDER — MORPHINE SULFATE 4 MG/ML IJ SOLN
4.0000 mg | Freq: Once | INTRAMUSCULAR | Status: AC
  Administered 2012-08-07: 4 mg via INTRAMUSCULAR
  Filled 2012-08-07: qty 1

## 2012-08-10 ENCOUNTER — Encounter: Payer: Self-pay | Admitting: Radiation Oncology

## 2012-08-10 ENCOUNTER — Ambulatory Visit
Admission: RE | Admit: 2012-08-10 | Discharge: 2012-08-10 | Disposition: A | Discharge status: Home / Self Care | Payer: Medicare Other | Source: Ambulatory Visit | Attending: Radiation Oncology | Admitting: Radiation Oncology

## 2012-08-10 ENCOUNTER — Ambulatory Visit (HOSPITAL_BASED_OUTPATIENT_CLINIC_OR_DEPARTMENT_OTHER): Payer: Medicare Other

## 2012-08-10 ENCOUNTER — Ambulatory Visit
Admission: RE | Admit: 2012-08-10 | Discharge: 2012-08-10 | Disposition: A | Discharge status: Home / Self Care | Payer: Medicare Other | Source: Ambulatory Visit | Admitting: Radiation Oncology

## 2012-08-10 ENCOUNTER — Other Ambulatory Visit (HOSPITAL_BASED_OUTPATIENT_CLINIC_OR_DEPARTMENT_OTHER): Payer: Medicare Other

## 2012-08-10 DIAGNOSIS — C3410 Malignant neoplasm of upper lobe, unspecified bronchus or lung: Secondary | ICD-10-CM

## 2012-08-10 DIAGNOSIS — Z5111 Encounter for antineoplastic chemotherapy: Secondary | ICD-10-CM

## 2012-08-10 DIAGNOSIS — C3490 Malignant neoplasm of unspecified part of unspecified bronchus or lung: Secondary | ICD-10-CM

## 2012-08-10 LAB — COMPREHENSIVE METABOLIC PANEL (CC13)
ALT: 14 U/L (ref 0–55)
BUN: 13.8 mg/dL (ref 7.0–26.0)
CO2: 25 mEq/L (ref 22–29)
Calcium: 8.9 mg/dL (ref 8.4–10.4)
Chloride: 103 mEq/L (ref 98–107)
Creatinine: 0.9 mg/dL (ref 0.7–1.3)
Glucose: 101 mg/dl — ABNORMAL HIGH (ref 70–99)
Total Bilirubin: 0.33 mg/dL (ref 0.20–1.20)

## 2012-08-10 LAB — CBC WITH DIFFERENTIAL/PLATELET
Basophils Absolute: 0 10*3/uL (ref 0.0–0.1)
Eosinophils Absolute: 0 10*3/uL (ref 0.0–0.5)
LYMPH%: 19.7 % (ref 14.0–49.0)
MCV: 95.4 fL (ref 79.3–98.0)
MONO#: 0.3 10*3/uL (ref 0.1–0.9)
MONO%: 10.7 % (ref 0.0–14.0)
NEUT%: 68.8 % (ref 39.0–75.0)
Platelets: 293 10*3/uL (ref 140–400)
nRBC: 0 % (ref 0–0)

## 2012-08-10 MED ORDER — PACLITAXEL CHEMO INJECTION 300 MG/50ML: 45.0000 mg/m2 | Freq: Once | INTRAVENOUS | Status: DC

## 2012-08-10 MED ORDER — SUCRALFATE 1 G PO TABS: 1.0000 g | ORAL_TABLET | Freq: Four times a day (QID) | ORAL | Status: DC

## 2012-08-10 MED ORDER — SODIUM CHLORIDE 0.9 % IV SOLN
45.0000 mg/m2 | Freq: Once | INTRAVENOUS | Status: AC
  Administered 2012-08-10: 84 mg via INTRAVENOUS
  Filled 2012-08-10: qty 14

## 2012-08-10 MED ORDER — SODIUM CHLORIDE 0.9 % IV SOLN
227.6000 mg | Freq: Once | INTRAVENOUS | Status: AC
  Administered 2012-08-10: 230 mg via INTRAVENOUS
  Filled 2012-08-10: qty 23

## 2012-08-10 MED ORDER — DIPHENHYDRAMINE HCL 50 MG/ML IJ SOLN
50.0000 mg | Freq: Once | INTRAMUSCULAR | Status: AC
  Administered 2012-08-10: 50 mg via INTRAVENOUS

## 2012-08-10 MED ORDER — ONDANSETRON 16 MG/50ML IVPB (CHCC)
16.0000 mg | Freq: Once | INTRAVENOUS | Status: AC
  Administered 2012-08-10: 16 mg via INTRAVENOUS

## 2012-08-10 MED ORDER — FAMOTIDINE IN NACL 20-0.9 MG/50ML-% IV SOLN
20.0000 mg | Freq: Once | INTRAVENOUS | Status: AC
  Administered 2012-08-10: 20 mg via INTRAVENOUS

## 2012-08-10 MED ORDER — SODIUM CHLORIDE 0.9 % IV SOLN
Freq: Once | INTRAVENOUS | Status: AC
  Administered 2012-08-10: 12:00:00 via INTRAVENOUS

## 2012-08-10 MED ORDER — DEXAMETHASONE SODIUM PHOSPHATE 4 MG/ML IJ SOLN
20.0000 mg | Freq: Once | INTRAMUSCULAR | Status: AC
  Administered 2012-08-10: 20 mg via INTRAVENOUS

## 2012-08-10 NOTE — Progress Notes (Signed)
Patient has transportation pick him up.  Ok to treat with WBC 2.4 per Tiana Loft, PA.

## 2012-08-10 NOTE — Patient Instructions (Addendum)
[facility] Discharge Instructions for Patients Receiving Chemotherapy  Today you received the following chemotherapy agents Taxol/Carboplatin To help prevent nausea and vomiting after your treatment, we encourage you to take your nausea medication as prescribed.  If you develop nausea and vomiting that is not controlled by your nausea medication, call the clinic. If it is after clinic hours your family physician or the after hours number for the clinic or go to the Emergency Department. Do not drive as you received 50 mg of Benadryl IV.   BELOW ARE SYMPTOMS THAT SHOULD BE REPORTED IMMEDIATELY:  *FEVER GREATER THAN 100.5 F  *CHILLS WITH OR WITHOUT FEVER  NAUSEA AND VOMITING THAT IS NOT CONTROLLED WITH YOUR NAUSEA MEDICATION  *UNUSUAL SHORTNESS OF BREATH  *UNUSUAL BRUISING OR BLEEDING  TENDERNESS IN MOUTH AND THROAT WITH OR WITHOUT PRESENCE OF ULCERS  *URINARY PROBLEMS  *BOWEL PROBLEMS  UNUSUAL RASH Items with * indicate a potential emergency and should be followed up as soon as possible.  One of the nurses will contact you 24 hours after your treatment. Please let the nurse know about any problems that you may have experienced. Feel free to call the clinic you have any questions or concerns. The clinic phone number is [phone].   I have been informed and understand all the instructions given to me. I know to contact the clinic, my physician, or go to the Emergency Department if any problems should occur. I do not have any questions at this time, but understand that I may call the clinic during office hours   should I have any questions or need assistance in obtaining follow up care.    __________________________________________  _____________  __________ Signature of Patient or Authorized Representative            Date                   Time    __________________________________________ Nurse's Signature

## 2012-08-10 NOTE — Progress Notes (Signed)
No images are attached to the encounter. No scans are attached to the encounter. No scans are attached to the encounter. [facility] OFFICE PROGRESS NOTE  Roger Sax, MD [address]  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 male returns for a scheduled regular symptom management visit for followup of his stage IIB/IIIA non-small cell lung cancer, squamous cell carcinoma. He continues with his course of concurrent chemoradiation. He states that he woke up this morning with a sore throat and stating that her to swallow. He denied any fever, chills, cough or sputum production. His symptoms may be related to his radiation therapy. He is encouraged to discuss the symptoms with the radiation oncologist.  MEDICAL HISTORY: Past Medical History  Diagnosis Date  . 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 HIV infection   . SOB (shortness of breath)   . Chronic pain in right shoulder     scapula and arm for 2 months   . Anxiety     ALLERGIES:  has No Known Allergies.  MEDICATIONS:  Current Outpatient Prescriptions  Medication Sig Dispense Refill  . carbamazepine (TEGRETOL) 200 MG tablet take 3 tablets by mouth every morning 2 AT NOON and 3 tablets by mouth at bedtime  240 tablet  6  . efavirenz-emtricitabine-tenofovir (ATRIPLA) 600-200-300 MG per tablet Take 1 tablet by mouth at bedtime.      Marland Kitchen ibuprofen (ADVIL,MOTRIN) 600 MG tablet Take 1 tablet (600 mg total) by mouth every 8 (eight) hours as needed for pain.  15 tablet  0  . LORazepam (ATIVAN) 1 MG tablet Take 1 tablet (1 mg total) by mouth every 8 (eight)  hours.  30 tablet  0  . LORazepam (ATIVAN) 1 MG tablet Take 1 tablet (1 mg total) by mouth every 8 (eight) hours.  30 tablet  0  . LORazepam (ATIVAN) 1 MG tablet Take 1 tablet (1 mg total) by mouth every 8 (eight) hours as needed for anxiety.  90 tablet  3  . morphine (MS CONTIN) 15 MG 12 hr tablet Take 1 tablet (15 mg total) by mouth 2 (two) times daily.  60 tablet  0  . nystatin (MYCOSTATIN) 100000 UNIT/ML suspension 5 ml swish and spit four times a day  180 mL  0  . oxyCODONE-acetaminophen (PERCOCET) 5-325 MG per tablet Take 1 tablet by mouth every 6 (six) hours as needed for pain.  40 tablet  0  . oxyCODONE-acetaminophen (PERCOCET/ROXICET) 5-325 MG per tablet Take 1-2 tablets by mouth every 4 (four) hours as needed for pain.  100 tablet  0  . PHENobarbital (LUMINAL) 64.8 MG tablet Take 1 tablet (64.8 mg total) by mouth at bedtime.  30 tablet  5  . prochlorperazine (COMPAZINE) 10 MG tablet Take 10 mg by mouth every 6 (six) hours as needed.       No current facility-administered medications for this visit.    SURGICAL HISTORY: No past surgical history on file.  REVIEW OF SYSTEMS:  Pertinent items are noted in HPI.   PHYSICAL EXAMINATION: General appearance: alert, cooperative, appears stated age and no distress Head: Normocephalic, without  obvious abnormality, atraumatic Neck: no adenopathy, no carotid bruit, no JVD, supple, symmetrical, trachea midline and thyroid not enlarged, symmetric, no tenderness/mass/nodules Lymph nodes: Cervical, supraclavicular, and axillary nodes normal. Resp: clear to auscultation bilaterally Cardio: regular rate and rhythm, S1, S2 normal, no murmur, click, rub or gallop GI: soft, non-tender; bowel sounds normal; no masses,  no organomegaly Extremities: extremities normal, atraumatic, no cyanosis or edema Neurologic: Alert and oriented X 3, normal strength and tone. Normal symmetric reflexes. Normal coordination and gait Oropharynx: reveals thrush has  resolved  ECOG PERFORMANCE STATUS: 1 - Symptomatic but completely ambulatory  Blood pressure 126/78, pulse 95, temperature 98.3 F (36.8 C), temperature source Oral, resp. rate 18, height 5\' 9"  (1.753 m), weight 147 lb 4.8 oz (66.815 kg).  LABORATORY DATA: Lab Results  Component Value Date   WBC 2.4* 08/10/2012   HGB 12.9* 08/10/2012   HCT 37.3* 08/10/2012   MCV 95.4 08/10/2012   PLT 293 08/10/2012      Chemistry      Component Value Date/Time   NA 137 08/10/2012 1054   NA 137 07/18/2012 1759   K 3.8 08/10/2012 1054   K 3.9 07/18/2012 1759   CL 103 08/10/2012 1054   CL 101 07/18/2012 1759   CO2 25 08/10/2012 1054   CO2 27 07/18/2012 1759   BUN 13.8 08/10/2012 1054   BUN 10 07/18/2012 1759   CREATININE 0.9 08/10/2012 1054   CREATININE 0.85 07/18/2012 1759   CREATININE 1.03 06/29/2012 0958      Component Value Date/Time   CALCIUM 8.9 08/10/2012 1054   CALCIUM 9.2 07/18/2012 1759   ALKPHOS 105 08/10/2012 1054   ALKPHOS 79 06/29/2012 0958   AST 20 08/10/2012 1054   AST 25 06/29/2012 0958   ALT 14 08/10/2012 1054   ALT 14 06/29/2012 0958   BILITOT 0.33 08/10/2012 1054   BILITOT 0.3 06/29/2012 0958       RADIOGRAPHIC STUDIES:  Dg Chest 1 View  07/13/2012  *RADIOLOGY REPORT*  Clinical Data: Status post right lung biopsy  CHEST - 1 VIEW  Comparison: PET CT dated 07/03/2012  Findings: No pneumothorax is seen following right lung biopsy.  Stable opacity at the medial right lung apex.  No pleural effusion.  The heart is normal in size  IMPRESSION: No pneumothorax is seen following right lung biopsy.   Original Report Authenticated By: Charline Bills, M.D.    Dg Chest 2 View  07/18/2012  *RADIOLOGY REPORT*  Clinical Data: Chest pain. Right apical lung cancer.  Recent biopsy of the right apical lesion.  CHEST - 2 VIEW  Comparison: 07/13/2012  Findings: Two views of the chest were obtained.  The lesion in the medial right lung apex appears unchanged.  There is no evidence for a pneumothorax.  Heart  and mediastinum are stable.  No evidence for pleural effusions.  IMPRESSION: No acute chest findings.  Stable appearance of the medial right apical lesion.   Original Report Authenticated By: Richarda Overlie, M.D.    Mr Laqueta Jean ZO Contrast  07/10/2012  *RADIOLOGY REPORT*  Clinical Data: Lung mass.  Evaluate for intracranial metastatic disease.  MRI HEAD WITHOUT AND WITH CONTRAST  Technique:  Multiplanar, multiecho pulse sequences of the brain and surrounding structures were obtained according to standard protocol without and with intravenous contrast  Contrast: 14mL MULTIHANCE GADOBENATE DIMEGLUMINE 529 MG/ML IV SOLN  Comparison: CT head 03/26/2004.  PET scan 07/03/2012  Findings: The patient had difficulty remaining motionless for the study.  Images are  suboptimal.  Small or subtle lesions could be overlooked.  There is no evidence for acute infarction, intracranial hemorrhage, mass lesion, hydrocephalus, or extra-axial fluid.  Mild atrophy. Chronic microvascular ischemic change affects the periventricular greater than subcortical white matter.  No foci of chronic hemorrhage.  No worrisome osseous lesions.  Mild cervical spondylosis.  Normal pituitary cerebellar tonsils.  Apparent sebaceous cyst of the right ear lobe.  Post infusion, no abnormal enhancement brain or meninges.  Post contrast coronal images are markedly degraded by motion.  Major intracranial vascular structures widely patent.  Negative orbits. No sinus or mastoid disease.  IMPRESSION: Motion degraded examination.  Mild atrophy and chronic microvascular ischemic change.  No definite foci of intracranial metastatic disease.  No acute intracranial findings.   Original Report Authenticated By: Davonna Belling, M.D.    Nm Pet Image Initial (pi) Skull Base To Thigh  07/03/2012  *RADIOLOGY REPORT*  Clinical Data: Initial treatment strategy for lung tumor.  NUCLEAR MEDICINE PET SKULL BASE TO THIGH  Fasting Blood Glucose:  93  Technique:  19 mCi F-18 FDG was  injected intravenously. CT data was obtained and used for attenuation correction and anatomic localization only.  (This was not acquired as a diagnostic CT examination.) Additional exam technical data entered on technologist worksheet.  Comparison:  None  Findings:  Neck: No hypermetabolic lymph nodes in the neck.  Chest:  Right superior sulcus tumor is identified.  This measures 2.6 x 4.0 x 4.5 cm.  The SUV max associated this mass is equal to 12.2.  No hypermetabolic mediastinal or hilar adenopathy. Irregular nodular density in the left upper lobe is again noted measuring 8 mm, image 82.  This has an SUV max equal to 0.9.  Mild increased FDG uptake is associated with bilateral axillary lymph nodes which have a benign appearance containing fatty hilum. The SUV max within the left axilla is equal to 4.5, image 71.  FDG uptake within the right axilla has an SUV max equal to 3.1, image 65.  Abdomen/Pelvis:  No abnormal hypermetabolic activity within the liver, pancreas, adrenal glands, or spleen.  No hypermetabolic lymph nodes in the abdomen or pelvis. Right adrenal nodule is identified measuring 1.7 cm.  Likely benign adenoma.  Left adrenal gland is normal.  Skeleton:  No focal hypermetabolic activity to suggest skeletal metastasis.  Soft tissue attenuating nodule within the left buttock region is noted measuring 2.4 x 1.7 cm.  There is no FDG uptake within this nodule which likely represents a sebaceous cyst  IMPRESSION:  1.  Right superior sulcus tumor exhibits intense malignant range FDG uptake.  This is worrisome for primary bronchogenic carcinoma. Correlation with biopsy is advised. No evidence for distant metastatic disease.  2.  8 mm sub solid nodule in the left upper lobe exhibits low level, non malignant FDG uptake. 3.  Right adrenal nodule.  Likely benign adenoma.   Original Report Authenticated By: Signa Kell, M.D.    US Biopsy  07/13/2012  *RADIOLOGY REPORT*  Indication: Right-sided Pancoast tumor  worrisome for bronchogenic carcinoma  ULTRASOUND GUIDED RIGHT APICAL LUNG MASS FINE NEEDLE ASPIRATION  Comparisons: PET CT - 07/03/2012; chest CT - 07/14/2012  Medications: Fentanyl 150 mcg IV; Versed 2.5 mg IV  Total Moderate Sedation Time: 25 minutes  Complications: None immediate  Findings / Technique:  Informed written consent was obtained from the patient after a discussion of the risks, benefits and alternatives to treatment. Questions regarding the procedure were encouraged and answered. Initial ultrasound scanning demonstrated  the mixed echogenic, largely hypoechoic infiltrative mass within the superior medial aspect of the right lung apex, correlating with the mass seen on preprocedural PET and chest CT.  An ultrasound image was saved for documentation purposes.  The procedure was planned.  A timeout was performed prior to the initiation of the procedure.  The operative was prepped and draped in the usual sterile fashion, and a sterile drape was applied covering the operative field.  A timeout was performed prior to the initiation of the procedure. Local anesthesia was provided with 1% lidocaine with epinephrine.  Under direct ultrasound guidance, 3 fine needle aspirates were obtained of the right apical mass with the use of a 22 Gauge Francine needle.  Multiple images were saved for documentation purposes.  Under direct ultrasound guidance, 3 fine needle aspirates were obtained of the right apical mass with the use of a 22 Gauge Francine needle.  Multiple images were saved for documentation purposes.  The samples were placed in saline and submitted to pathology. Quick stain pathologic review confirmed the acquisition of lesional tissue.  The needle was removed and hemostasis was achieved with manual compression.  Post procedure scan was negative for significant hematoma or additional complication.  A dressing was placed.  The patient tolerated the procedure well without immediate postprocedural  complication. The patient was escorted to have an upright chest radiograph.  Impression:  Successful ultrasound guided right apical lung mass fine-needle aspiration.   Original Report Authenticated By: Tacey Ruiz, MD      ASSESSMENT/PLAN: The patient is a very pleasant 57 year old African American male recently diagnosed with stage IIB/IIIA non-small cell lung cell lung cancer, squamous cell carcinoma presenting as a right Pancoast tumor. He has other medical comorbidities including HIV and a history of recurrent seizure. He is currently undergoing a course of concurrent chemoradiation with weekly carboplatin for AUC of 2 and paclitaxel 45 mg/M2. The patient was discussed with Dr. Arbutus Ped. He will continue with his course of concurrent chemoradiation as scheduled. He'll followup with Dr. Arbutus Ped in 2 weeks with repeat CBC differential, C. met for another symptom management visit.    Roger Walton, Roger Fuelling E, PA-C     All questions were answered. The patient knows to call the clinic with any problems, questions or concerns. We can certainly see the patient much sooner if necessary.  I spent 20 minutes counseling the patient face to face. The total time spent in the appointment was 30 minutes.

## 2012-08-10 NOTE — Progress Notes (Signed)
Patient has c/o sore throat 2 weeks, stated patient, tolerated weekly r shoulder rad tx without morphine injection today, 9/25 txs compleetd, wants something for sore throat, has tried salt water rinces 4:51 PM

## 2012-08-11 ENCOUNTER — Ambulatory Visit
Admission: RE | Admit: 2012-08-11 | Discharge: 2012-08-11 | Disposition: A | Discharge status: Home / Self Care | Payer: Medicare Other | Source: Ambulatory Visit | Attending: Radiation Oncology | Admitting: Radiation Oncology

## 2012-08-11 NOTE — Progress Notes (Signed)
Department of Radiation Oncology  Phone:  [phone] Fax:        [phone]  Weekly Treatment Note    Name: Roger Walton Date: 08/11/2012 MRN: [id number] DOB: 02-08-1956   Current dose: 16.2 Gy  Current fraction: 9   MEDICATIONS: Current Outpatient Prescriptions  Medication Sig Dispense Refill  . carbamazepine (TEGRETOL) 200 MG tablet take 3 tablets by mouth every morning 2 AT NOON and 3 tablets by mouth at bedtime  240 tablet  6  . efavirenz-emtricitabine-tenofovir (ATRIPLA) 600-200-300 MG per tablet Take 1 tablet by mouth at bedtime.      Marland Kitchen ibuprofen (ADVIL,MOTRIN) 600 MG tablet Take 1 tablet (600 mg total) by mouth every 8 (eight) hours as needed for pain.  15 tablet  0  . LORazepam (ATIVAN) 1 MG tablet Take 1 tablet (1 mg total) by mouth every 8 (eight) hours.  30 tablet  0  . LORazepam (ATIVAN) 1 MG tablet Take 1 tablet (1 mg total) by mouth every 8 (eight) hours.  30 tablet  0  . LORazepam (ATIVAN) 1 MG tablet Take 1 tablet (1 mg total) by mouth every 8 (eight) hours as needed for anxiety.  90 tablet  3  . morphine (MS CONTIN) 15 MG 12 hr tablet Take 1 tablet (15 mg total) by mouth 2 (two) times daily.  60 tablet  0  . nystatin (MYCOSTATIN) 100000 UNIT/ML suspension 5 ml swish and spit four times a day  180 mL  0  . oxyCODONE-acetaminophen (PERCOCET) 5-325 MG per tablet Take 1 tablet by mouth every 6 (six) hours as needed for pain.  40 tablet  0  . oxyCODONE-acetaminophen (PERCOCET/ROXICET) 5-325 MG per tablet Take 1-2 tablets by mouth every 4 (four) hours as needed for pain.  100 tablet  0  . PHENobarbital (LUMINAL) 64.8 MG tablet Take 1 tablet (64.8 mg total) by mouth at bedtime.  30 tablet  5  . prochlorperazine (COMPAZINE) 10 MG tablet Take 10 mg by mouth every 6 (six) hours as needed.      . sucralfate (CARAFATE) 1 G tablet Take 1 tablet (1 g total) by mouth 4 (four) times daily. Dissolve in 15 cc water.  120 tablet  1   No current facility-administered  medications for this encounter.     ALLERGIES: Review of patient's allergies indicates no known allergies.   LABORATORY DATA:  Lab Results  Component Value Date   WBC 2.4* 08/10/2012   HGB 12.9* 08/10/2012   HCT 37.3* 08/10/2012   MCV 95.4 08/10/2012   PLT 293 08/10/2012   Lab Results  Component Value Date   NA 137 08/10/2012   K 3.8 08/10/2012   CL 103 08/10/2012   CO2 25 08/10/2012   Lab Results  Component Value Date   ALT 14 08/10/2012   AST 20 08/10/2012   ALKPHOS 105 08/10/2012   BILITOT 0.33 08/10/2012     NARRATIVE: Roger Walton was seen today for weekly treatment management. The chart was checked and the patient's films were reviewed. The patient complains of esophagitis today which has been worsening. He is now taking anything specifically for this currently.  PHYSICAL EXAMINATION: weight is 144 lb 4.8 oz (65.454 kg). His oral temperature is 99.5 F (37.5 C). His blood pressure is 112/70 and his pulse is 89. His respiration is 20.        ASSESSMENT: The patient is doing satisfactorily with treatment.  PLAN: We will continue with the patient's radiation treatment as planned.  I have given the patient a prescription for Carafate. He also was instructed to begin Prilosec or an equivalent medication and this was written out for him.

## 2012-08-12 ENCOUNTER — Ambulatory Visit
Admission: RE | Admit: 2012-08-12 | Discharge: 2012-08-12 | Disposition: A | Discharge status: Home / Self Care | Payer: Medicare Other | Source: Ambulatory Visit | Attending: Radiation Oncology | Admitting: Radiation Oncology

## 2012-08-12 ENCOUNTER — Encounter: Payer: Self-pay | Admitting: Radiation Oncology

## 2012-08-12 NOTE — Progress Notes (Signed)
  Radiation Oncology         [phone] ________________________________  Name: Roger Walton MRN: [id number]  Date: 08/12/2012  DOB: 07/31/1955  Weekly Radiation Therapy Management  Current Dose: 19.8 Gy     Planned Dose:  45 Gy  Narrative . . . . . . . . The patient presents for routine under treatment assessment.                                            Mr .Walton here for weekly under treatment visit. He has had 11/25 fractions to his right lung. He states his pain is a 10/10 in his throat and his right shoulder. He has been using the Carafate and he states it is helping about "2%." He denies shortness of breath. He has been having trouble eating due to his sore throat. His weight today was 138 lb which is down from 144 lb on 08/10/2012.                                  Set-up films were reviewed.                                 The chart was checked. Physical Findings. . .  height is 5\' 9"  (1.753 m) and weight is 138 lb (62.596 kg). His temperature is 99 F (37.2 C). His blood pressure is 126/76 and his pulse is 91. His oxygen saturation is 100%. . Weight essentially stable.  No significant changes. Impression . . . . . . . The patient is tolerating radiation. Plan . . . . . . . . . . . . Continue treatment as planned.  ________________________________  Artist Pais. Kathrynn Running, M.D.

## 2012-08-12 NOTE — Progress Notes (Signed)
Roger Walton here for weekly under treatment visit.  He has had 11/25 fractions to his right lung.  He states his pain is a 10/10 in his throat and his right shoulder.  He has been using the Carafate and he states it is helping about "2%."  He denies shortness of breath.  He has been having trouble eating due to his sore throat.  His weight today was 138 lb which is down from 144 lb on 08/10/2012.

## 2012-08-13 ENCOUNTER — Ambulatory Visit
Admission: RE | Admit: 2012-08-13 | Discharge: 2012-08-13 | Disposition: A | Discharge status: Home / Self Care | Payer: Medicare Other | Source: Ambulatory Visit | Attending: Radiation Oncology | Admitting: Radiation Oncology

## 2012-08-14 ENCOUNTER — Ambulatory Visit
Admission: RE | Admit: 2012-08-14 | Discharge: 2012-08-14 | Disposition: A | Discharge status: Home / Self Care | Payer: Medicare Other | Source: Ambulatory Visit | Attending: Radiation Oncology | Admitting: Radiation Oncology

## 2012-08-17 ENCOUNTER — Encounter: Payer: Self-pay | Admitting: Internal Medicine

## 2012-08-17 ENCOUNTER — Ambulatory Visit (HOSPITAL_BASED_OUTPATIENT_CLINIC_OR_DEPARTMENT_OTHER): Payer: Medicaid Other

## 2012-08-17 ENCOUNTER — Ambulatory Visit (HOSPITAL_BASED_OUTPATIENT_CLINIC_OR_DEPARTMENT_OTHER): Payer: Medicare Other | Admitting: Internal Medicine

## 2012-08-17 ENCOUNTER — Ambulatory Visit
Admission: RE | Admit: 2012-08-17 | Discharge: 2012-08-17 | Disposition: A | Discharge status: Home / Self Care | Payer: Medicare Other | Source: Ambulatory Visit | Attending: Radiation Oncology | Admitting: Radiation Oncology

## 2012-08-17 ENCOUNTER — Other Ambulatory Visit (HOSPITAL_BASED_OUTPATIENT_CLINIC_OR_DEPARTMENT_OTHER): Payer: Medicaid Other | Admitting: Lab

## 2012-08-17 ENCOUNTER — Other Ambulatory Visit: Payer: Medicare Other | Admitting: Lab

## 2012-08-17 ENCOUNTER — Ambulatory Visit: Payer: Medicare Other | Admitting: Nutrition

## 2012-08-17 DIAGNOSIS — C3490 Malignant neoplasm of unspecified part of unspecified bronchus or lung: Secondary | ICD-10-CM

## 2012-08-17 DIAGNOSIS — C3410 Malignant neoplasm of upper lobe, unspecified bronchus or lung: Secondary | ICD-10-CM

## 2012-08-17 DIAGNOSIS — Z5111 Encounter for antineoplastic chemotherapy: Secondary | ICD-10-CM

## 2012-08-17 LAB — CBC WITH DIFFERENTIAL/PLATELET
BASO%: 0.4 % (ref 0.0–2.0)
Eosinophils Absolute: 0 10*3/uL (ref 0.0–0.5)
HCT: 35.8 % — ABNORMAL LOW (ref 38.4–49.9)
MCHC: 34.9 g/dL (ref 32.0–36.0)
MONO#: 0.2 10*3/uL (ref 0.1–0.9)
NEUT#: 1.7 10*3/uL (ref 1.5–6.5)
NEUT%: 72.3 % (ref 39.0–75.0)
WBC: 2.4 10*3/uL — ABNORMAL LOW (ref 4.0–10.3)
lymph#: 0.4 10*3/uL — ABNORMAL LOW (ref 0.9–3.3)
nRBC: 0 % (ref 0–0)

## 2012-08-17 LAB — COMPREHENSIVE METABOLIC PANEL (CC13)
ALT: 12 U/L (ref 0–55)
AST: 20 U/L (ref 5–34)
Albumin: 2.8 g/dL — ABNORMAL LOW (ref 3.5–5.0)
BUN: 16 mg/dL (ref 7.0–26.0)
CO2: 25 mEq/L (ref 22–29)
Calcium: 9 mg/dL (ref 8.4–10.4)
Chloride: 105 mEq/L (ref 98–107)
Potassium: 4.4 mEq/L (ref 3.5–5.1)

## 2012-08-17 MED ORDER — SODIUM CHLORIDE 0.9 % IV SOLN
45.0000 mg/m2 | Freq: Once | INTRAVENOUS | Status: AC
  Administered 2012-08-17: 84 mg via INTRAVENOUS
  Filled 2012-08-17: qty 14

## 2012-08-17 MED ORDER — DEXAMETHASONE SODIUM PHOSPHATE 4 MG/ML IJ SOLN
20.0000 mg | Freq: Once | INTRAMUSCULAR | Status: AC
  Administered 2012-08-17: 20 mg via INTRAVENOUS

## 2012-08-17 MED ORDER — FAMOTIDINE IN NACL 20-0.9 MG/50ML-% IV SOLN
20.0000 mg | Freq: Once | INTRAVENOUS | Status: AC
  Administered 2012-08-17: 20 mg via INTRAVENOUS

## 2012-08-17 MED ORDER — ONDANSETRON 16 MG/50ML IVPB (CHCC)
16.0000 mg | Freq: Once | INTRAVENOUS | Status: AC
  Administered 2012-08-17: 16 mg via INTRAVENOUS

## 2012-08-17 MED ORDER — SODIUM CHLORIDE 0.9 % IV SOLN
Freq: Once | INTRAVENOUS | Status: AC
  Administered 2012-08-17: 13:00:00 via INTRAVENOUS

## 2012-08-17 MED ORDER — DIPHENHYDRAMINE HCL 50 MG/ML IJ SOLN
50.0000 mg | Freq: Once | INTRAMUSCULAR | Status: AC
  Administered 2012-08-17: 50 mg via INTRAVENOUS

## 2012-08-17 MED ORDER — SODIUM CHLORIDE 0.9 % IV SOLN
227.6000 mg | Freq: Once | INTRAVENOUS | Status: AC
  Administered 2012-08-17: 230 mg via INTRAVENOUS
  Filled 2012-08-17: qty 23

## 2012-08-17 NOTE — Progress Notes (Signed)
[facility] [phone]   [phone]  OFFICE PROGRESS NOTE  [name], MD [address]  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   INTERVAL HISTORY: Roger Walton 57 y.o. male returns to the clinic today for followup visit accompanied by his wife. The patient is tolerating his concurrent chemoradiation fairly well with no significant adverse effects. He denied having any significant chest pain, shortness breath, cough or hemoptysis. He denied having any nausea or vomiting. He has no fever or chills. He lost few pounds since his last visit.  MEDICAL HISTORY: Past Medical History  Diagnosis Date  . 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 HIV infection   . SOB (shortness of breath)   . Chronic pain in right shoulder     scapula and arm for 2 months   . Anxiety     ALLERGIES:  has No Known Allergies.  MEDICATIONS:  Current Outpatient Prescriptions  Medication Sig Dispense Refill  . carbamazepine (TEGRETOL) 200 MG tablet take 3 tablets by mouth every morning 2 AT NOON and 3 tablets by mouth at bedtime  240 tablet  6  . efavirenz-emtricitabine-tenofovir (ATRIPLA) 600-200-300 MG per tablet Take 1 tablet by mouth at bedtime.      Marland Kitchen ibuprofen (ADVIL,MOTRIN) 600 MG tablet Take 1 tablet (600 mg total) by mouth every 8 (eight) hours as needed for pain.  15 tablet  0  . LORazepam (ATIVAN) 1 MG tablet Take 1 tablet (1 mg total) by mouth every 8 (eight) hours as needed for anxiety.  90 tablet  3  . morphine (MS CONTIN) 15 MG 12 hr tablet Take 1 tablet (15 mg total) by mouth 2 (two) times  daily.  60 tablet  0  . nystatin (MYCOSTATIN) 100000 UNIT/ML suspension 5 ml swish and spit four times a day  180 mL  0  . oxyCODONE-acetaminophen (PERCOCET/ROXICET) 5-325 MG per tablet Take 1-2 tablets by mouth every 4 (four) hours as needed for pain.  100 tablet  0  . PHENobarbital (LUMINAL) 64.8 MG tablet Take 1 tablet (64.8 mg total) by mouth at bedtime.  30 tablet  5  . prochlorperazine (COMPAZINE) 10 MG tablet Take 10 mg by mouth every 6 (six) hours as needed.      . sucralfate (CARAFATE) 1 G tablet Take 1 tablet (1 g total) by mouth 4 (four) times daily. Dissolve in 15 cc water.  120 tablet  1   No current facility-administered medications for this visit.    REVIEW OF SYSTEMS:  A comprehensive review of systems was negative except for: Constitutional: positive for fatigue Respiratory: positive for dyspnea on exertion   PHYSICAL EXAMINATION: General appearance: alert, cooperative and no distress Head: Normocephalic, without obvious abnormality, atraumatic Neck: no adenopathy Lymph nodes: Cervical, supraclavicular, and axillary nodes normal. Resp: clear to auscultation bilaterally Cardio: regular rate and rhythm, S1, S2 normal, no murmur, click, rub or gallop GI: soft, non-tender; bowel sounds normal; no masses,  no organomegaly Extremities: extremities normal, atraumatic, no cyanosis or edema Neurologic: Alert and oriented X 3, normal strength and tone. Normal  symmetric reflexes. Normal coordination and gait  ECOG PERFORMANCE STATUS: 1 - Symptomatic but completely ambulatory  Blood pressure 111/69, pulse 105, temperature 98.8 F (37.1 C), temperature source Oral, resp. rate 18, height 5\' 9"  (1.753 m), weight 138 lb 4.8 oz (62.732 kg).  LABORATORY DATA: Lab Results  Component Value Date   WBC 2.4* 08/17/2012   HGB 12.5* 08/17/2012   HCT 35.8* 08/17/2012   MCV 95.2 08/17/2012   PLT 248 08/17/2012      Chemistry      Component Value Date/Time   NA 137 08/10/2012 1054   NA 137  07/18/2012 1759   K 3.8 08/10/2012 1054   K 3.9 07/18/2012 1759   CL 103 08/10/2012 1054   CL 101 07/18/2012 1759   CO2 25 08/10/2012 1054   CO2 27 07/18/2012 1759   BUN 13.8 08/10/2012 1054   BUN 10 07/18/2012 1759   CREATININE 0.9 08/10/2012 1054   CREATININE 0.85 07/18/2012 1759   CREATININE 1.03 06/29/2012 0958      Component Value Date/Time   CALCIUM 8.9 08/10/2012 1054   CALCIUM 9.2 07/18/2012 1759   ALKPHOS 105 08/10/2012 1054   ALKPHOS 79 06/29/2012 0958   AST 20 08/10/2012 1054   AST 25 06/29/2012 0958   ALT 14 08/10/2012 1054   ALT 14 06/29/2012 0958   BILITOT 0.33 08/10/2012 1054   BILITOT 0.3 06/29/2012 0958       RADIOGRAPHIC STUDIES: Dg Chest 2 View  07/18/2012  *RADIOLOGY REPORT*  Clinical Data: Chest pain. Right apical lung cancer.  Recent biopsy of the right apical lesion.  CHEST - 2 VIEW  Comparison: 07/13/2012  Findings: Two views of the chest were obtained.  The lesion in the medial right lung apex appears unchanged.  There is no evidence for a pneumothorax.  Heart and mediastinum are stable.  No evidence for pleural effusions.  IMPRESSION: No acute chest findings.  Stable appearance of the medial right apical lesion.   Original Report Authenticated By: Richarda Overlie, M.D.     ASSESSMENT: This is a very pleasant 57 years old African American male with history of stage IIB/IIIa non-small cell lung cancer currently undergoing concurrent chemoradiation with weekly carboplatin and paclitaxel status post 3 doses.  PLAN: I discussed the lab result with the patient and his wife today. I recommended for him to continue with the current course of concurrent chemoradiation. The patient will receive cycle #4 today of his chemotherapy. I would see him back for followup visit in 2 weeks for evaluation and management any adverse effect of his treatment. He was advised to call immediately if he has any concerning symptoms in the interval.  All questions were answered. The patient knows to call the  clinic with any problems, questions or concerns. We can certainly see the patient much sooner if necessary.  I spent 15 minutes counseling the patient face to face. The total time spent in the appointment was 25 minutes.

## 2012-08-17 NOTE — Progress Notes (Signed)
Ok to treat with WBC 2.4 per Dr. Arbutus Ped

## 2012-08-17 NOTE — Patient Instructions (Signed)
Continue concurrent chemoradiation as scheduled. Followup visit in 2 weeks 

## 2012-08-17 NOTE — Progress Notes (Signed)
Patient is a 57 year old male diagnosed with lung cancer. He is a patient of Dr. Shirline Frees.  Past medical history includes seizures, HIV infection, and anxiety.  Medications include Ativan, Percocet, Compazine, and Carafate.  Labs include glucose of 101, albumin 2.7 on April 21.  Height: 69 inches. Weight: 138.3 pounds Usual body weight: 150 pounds. BMI: 20.41  Patient reports painful and difficulty swallowing. He eats 2 meals daily. He has had nausea on occasion. Patient states he has tried ensure in the past and it gave him a rash so he no longer drinks ensure. He is concerned with weight loss.  Nutrition diagnosis: Unintended weight loss related to diagnosis of lung cancer and associated treatments as evidenced by 8 percent loss from usual body weight.  Intervention: I educated patient and wife to increase oral intake to 6 small meals/snacks daily. I have reviewed appropriate high-calorie snacks with patient. I've educated patient to try an oral nutrition supplement between meals. I provided patient with samples of boost and Carnation breakfast essentials since patient tolerates milk and ice cream. He was instructed to discontinue if he developed a rash. I've educated patient on the importance of taking medications if he experiences nausea. I've also educated him on how to alternate textures and temperatures of food for better tolerance. I provided fact sheets and samples along with my contact information for questions or concerns.  Monitoring, evaluation, goals: Patient will tolerate increased meals and snacks along with oral nutrition supplements to minimize further weight loss.  Next visit: Patient will call me with questions or concerns.

## 2012-08-17 NOTE — Patient Instructions (Addendum)
 Cancer Center Discharge Instructions for Patients Receiving Chemotherapy  Today you received the following chemotherapy agents taxol,carboplatin  To help prevent nausea and vomiting after your treatment, we encourage you to take your nausea medication as prescribed.   If you develop nausea and vomiting that is not controlled by your nausea medication, call the clinic. If it is after clinic hours your family physician or the after hours number for the clinic or go to the Emergency Department.   BELOW ARE SYMPTOMS THAT SHOULD BE REPORTED IMMEDIATELY:  *FEVER GREATER THAN 100.5 F  *CHILLS WITH OR WITHOUT FEVER  NAUSEA AND VOMITING THAT IS NOT CONTROLLED WITH YOUR NAUSEA MEDICATION  *UNUSUAL SHORTNESS OF BREATH  *UNUSUAL BRUISING OR BLEEDING  TENDERNESS IN MOUTH AND THROAT WITH OR WITHOUT PRESENCE OF ULCERS  *URINARY PROBLEMS  *BOWEL PROBLEMS  UNUSUAL RASH Items with * indicate a potential emergency and should be followed up as soon as possible.   Please let the nurse know about any problems that you may have experienced. Feel free to call the clinic you have any questions or concerns. The clinic phone number is [phone].   I have been informed and understand all the instructions given to me. I know to contact the clinic, my physician, or go to the Emergency Department if any problems should occur. I do not have any questions at this time, but understand that I may call the clinic during office hours   should I have any questions or need assistance in obtaining follow up care.    __________________________________________  _____________  __________ Signature of Patient or Authorized Representative            Date                   Time    __________________________________________ Nurse's Signature

## 2012-08-17 NOTE — Progress Notes (Signed)
Pt requested that i fax recent office notes from radiation and med onc to [facility] of transportation per their request

## 2012-08-18 ENCOUNTER — Ambulatory Visit
Admission: RE | Admit: 2012-08-18 | Discharge: 2012-08-18 | Disposition: A | Discharge status: Home / Self Care | Payer: Medicare Other | Source: Ambulatory Visit | Attending: Radiation Oncology | Admitting: Radiation Oncology

## 2012-08-19 ENCOUNTER — Ambulatory Visit
Admission: RE | Admit: 2012-08-19 | Discharge: 2012-08-19 | Disposition: A | Discharge status: Home / Self Care | Payer: Medicare Other | Source: Ambulatory Visit | Attending: Radiation Oncology | Admitting: Radiation Oncology

## 2012-08-20 ENCOUNTER — Ambulatory Visit: Admission: RE | Admit: 2012-08-20 | Payer: Medicare Other | Source: Ambulatory Visit

## 2012-08-21 ENCOUNTER — Ambulatory Visit
Admission: RE | Admit: 2012-08-21 | Discharge: 2012-08-21 | Disposition: A | Discharge status: Home / Self Care | Payer: Medicare Other | Source: Ambulatory Visit | Attending: Radiation Oncology | Admitting: Radiation Oncology

## 2012-08-21 MED ORDER — RADIAPLEXRX EX GEL
Freq: Once | CUTANEOUS | Status: AC
  Administered 2012-08-21: 15:00:00 via TOPICAL

## 2012-08-24 ENCOUNTER — Ambulatory Visit
Admission: RE | Admit: 2012-08-24 | Discharge: 2012-08-24 | Disposition: A | Discharge status: Home / Self Care | Payer: Medicare Other | Source: Ambulatory Visit | Attending: Radiation Oncology | Admitting: Radiation Oncology

## 2012-08-24 ENCOUNTER — Other Ambulatory Visit (HOSPITAL_BASED_OUTPATIENT_CLINIC_OR_DEPARTMENT_OTHER): Payer: Medicare Other | Admitting: Lab

## 2012-08-24 ENCOUNTER — Encounter: Payer: Self-pay | Admitting: Radiation Oncology

## 2012-08-24 ENCOUNTER — Other Ambulatory Visit: Payer: Self-pay | Admitting: Medical Oncology

## 2012-08-24 ENCOUNTER — Ambulatory Visit (HOSPITAL_BASED_OUTPATIENT_CLINIC_OR_DEPARTMENT_OTHER): Payer: Medicaid Other

## 2012-08-24 DIAGNOSIS — Z5111 Encounter for antineoplastic chemotherapy: Secondary | ICD-10-CM

## 2012-08-24 DIAGNOSIS — C3410 Malignant neoplasm of upper lobe, unspecified bronchus or lung: Secondary | ICD-10-CM

## 2012-08-24 DIAGNOSIS — C3490 Malignant neoplasm of unspecified part of unspecified bronchus or lung: Secondary | ICD-10-CM

## 2012-08-24 DIAGNOSIS — R918 Other nonspecific abnormal finding of lung field: Secondary | ICD-10-CM

## 2012-08-24 LAB — COMPREHENSIVE METABOLIC PANEL (CC13)
AST: 23 U/L (ref 5–34)
Albumin: 3 g/dL — ABNORMAL LOW (ref 3.5–5.0)
Alkaline Phosphatase: 113 U/L (ref 40–150)
BUN: 18.7 mg/dL (ref 7.0–26.0)
Potassium: 5 mEq/L (ref 3.5–5.1)
Sodium: 140 mEq/L (ref 136–145)
Total Protein: 8.4 g/dL — ABNORMAL HIGH (ref 6.4–8.3)

## 2012-08-24 LAB — CBC WITH DIFFERENTIAL/PLATELET
BASO%: 0.4 % (ref 0.0–2.0)
EOS%: 0.8 % (ref 0.0–7.0)
MCH: 33.5 pg — ABNORMAL HIGH (ref 27.2–33.4)
MCHC: 34.5 g/dL (ref 32.0–36.0)
RBC: 3.79 10*6/uL — ABNORMAL LOW (ref 4.20–5.82)
RDW: 13.4 % (ref 11.0–14.6)
lymph#: 0.4 10*3/uL — ABNORMAL LOW (ref 0.9–3.3)
nRBC: 0 % (ref 0–0)

## 2012-08-24 MED ORDER — SODIUM CHLORIDE 0.9 % IV SOLN
Freq: Once | INTRAVENOUS | Status: AC
  Administered 2012-08-24: 13:00:00 via INTRAVENOUS

## 2012-08-24 MED ORDER — SODIUM CHLORIDE 0.9 % IV SOLN
45.0000 mg/m2 | Freq: Once | INTRAVENOUS | Status: AC
  Administered 2012-08-24: 84 mg via INTRAVENOUS
  Filled 2012-08-24: qty 14

## 2012-08-24 MED ORDER — DIPHENHYDRAMINE HCL 50 MG/ML IJ SOLN
50.0000 mg | Freq: Once | INTRAMUSCULAR | Status: AC
  Administered 2012-08-24: 50 mg via INTRAVENOUS

## 2012-08-24 MED ORDER — ONDANSETRON 16 MG/50ML IVPB (CHCC)
16.0000 mg | Freq: Once | INTRAVENOUS | Status: AC
  Administered 2012-08-24: 16 mg via INTRAVENOUS

## 2012-08-24 MED ORDER — SODIUM CHLORIDE 0.9 % IV SOLN
227.6000 mg | Freq: Once | INTRAVENOUS | Status: AC
  Administered 2012-08-24: 230 mg via INTRAVENOUS
  Filled 2012-08-24: qty 23

## 2012-08-24 MED ORDER — DEXAMETHASONE SODIUM PHOSPHATE 20 MG/5ML IJ SOLN
20.0000 mg | Freq: Once | INTRAMUSCULAR | Status: AC
  Administered 2012-08-24: 20 mg via INTRAVENOUS

## 2012-08-24 MED ORDER — FAMOTIDINE IN NACL 20-0.9 MG/50ML-% IV SOLN
20.0000 mg | Freq: Once | INTRAVENOUS | Status: AC
  Administered 2012-08-24: 20 mg via INTRAVENOUS

## 2012-08-24 MED ORDER — OXYCODONE-ACETAMINOPHEN 5-325 MG PO TABS
1.0000 | ORAL_TABLET | Freq: Once | ORAL | Status: AC
  Administered 2012-08-24: 1 via ORAL

## 2012-08-24 MED ORDER — OXYCODONE-ACETAMINOPHEN 5-325 MG PO TABS: 1.0000 | ORAL_TABLET | ORAL | Status: DC

## 2012-08-24 MED ORDER — MORPHINE SULFATE ER 15 MG PO TBCR: 15.0000 mg | EXTENDED_RELEASE_TABLET | Freq: Two times a day (BID) | ORAL | Status: DC

## 2012-08-24 NOTE — Patient Instructions (Addendum)
Carboplatin injection What is this medicine? CARBOPLATIN (KAR boe pla tin) is a chemotherapy drug. It targets fast dividing cells, like cancer cells, and causes these cells to die. This medicine is used to treat ovarian cancer and many other cancers. This medicine may be used for other purposes; ask your health care provider or pharmacist if you have questions. What should I tell my health care provider before I take this medicine? They need to know if you have any of these conditions: -blood disorders -hearing problems -kidney disease -recent or ongoing radiation therapy -an unusual or allergic reaction to carboplatin, cisplatin, other chemotherapy, other medicines, foods, dyes, or preservatives -pregnant or trying to get pregnant -breast-feeding How should I use this medicine? This drug is usually given as an infusion into a vein. It is administered in a hospital or clinic by a specially trained health care professional. Talk to your pediatrician regarding the use of this medicine in children. Special care may be needed. Overdosage: If you think you have taken too much of this medicine contact a poison control center or emergency room at once. NOTE: This medicine is only for you. Do not share this medicine with others. What if I miss a dose? It is important not to miss a dose. Call your doctor or health care professional if you are unable to keep an appointment. What may interact with this medicine? -medicines for seizures -medicines to increase blood counts like filgrastim, pegfilgrastim, sargramostim -some antibiotics like amikacin, gentamicin, neomycin, streptomycin, tobramycin -vaccines Talk to your doctor or health care professional before taking any of these medicines: -acetaminophen -aspirin -ibuprofen -ketoprofen -naproxen This list may not describe all possible interactions. Give your health care provider a list of all the medicines, herbs, non-prescription drugs, or dietary  supplements you use. Also tell them if you smoke, drink alcohol, or use illegal drugs. Some items may interact with your medicine. What should I watch for while using this medicine? Your condition will be monitored carefully while you are receiving this medicine. You will need important blood work done while you are taking this medicine. This drug may make you feel generally unwell. This is not uncommon, as chemotherapy can affect healthy cells as well as cancer cells. Report any side effects. Continue your course of treatment even though you feel ill unless your doctor tells you to stop. In some cases, you may be given additional medicines to help with side effects. Follow all directions for their use. Call your doctor or health care professional for advice if you get a fever, chills or sore throat, or other symptoms of a cold or flu. Do not treat yourself. This drug decreases your body's ability to fight infections. Try to avoid being around people who are sick. This medicine may increase your risk to bruise or bleed. Call your doctor or health care professional if you notice any unusual bleeding. Be careful brushing and flossing your teeth or using a toothpick because you may get an infection or bleed more easily. If you have any dental work done, tell your dentist you are receiving this medicine. Avoid taking products that contain aspirin, acetaminophen, ibuprofen, naproxen, or ketoprofen unless instructed by your doctor. These medicines may hide a fever. Do not become pregnant while taking this medicine. Women should inform their doctor if they wish to become pregnant or think they might be pregnant. There is a potential for serious side effects to an unborn child. Talk to your health care professional or pharmacist for more information.   Do not breast-feed an infant while taking this medicine. What side effects may I notice from receiving this medicine? Side effects that you should report to your  doctor or health care professional as soon as possible: -allergic reactions like skin rash, itching or hives, swelling of the face, lips, or tongue -signs of infection - fever or chills, cough, sore throat, pain or difficulty passing urine -signs of decreased platelets or bleeding - bruising, pinpoint red spots on the skin, black, tarry stools, nosebleeds -signs of decreased red blood cells - unusually weak or tired, fainting spells, lightheadedness -breathing problems -changes in hearing -changes in vision -chest pain -high blood pressure -low blood counts - This drug may decrease the number of white blood cells, red blood cells and platelets. You may be at increased risk for infections and bleeding. -nausea and vomiting -pain, swelling, redness or irritation at the injection site -pain, tingling, numbness in the hands or feet -problems with balance, talking, walking -trouble passing urine or change in the amount of urine Side effects that usually do not require medical attention (report to your doctor or health care professional if they continue or are bothersome): -hair loss -loss of appetite -metallic taste in the mouth or changes in taste This list may not describe all possible side effects. Call your doctor for medical advice about side effects. You may report side effects to FDA at 1-800-FDA-1088. Where should I keep my medicine? This drug is given in a hospital or clinic and will not be stored at home. NOTE: This sheet is a summary. It may not cover all possible information. If you have questions about this medicine, talk to your doctor, pharmacist, or health care provider.  2012, [employer]/Gold Standard. (07/14/2007 2:38:05 PM)Paclitaxel injection What is this medicine? PACLITAXEL (PAK li TAX el) is a chemotherapy drug. It targets fast dividing cells, like cancer cells, and causes these cells to die. This medicine is used to treat ovarian cancer, breast cancer, and other  cancers. This medicine may be used for other purposes; ask your health care provider or pharmacist if you have questions. What should I tell my health care provider before I take this medicine? They need to know if you have any of these conditions: -blood disorders -irregular heartbeat -infection (especially a virus infection such as chickenpox, cold sores, or herpes) -liver disease -previous or ongoing radiation therapy -an unusual or allergic reaction to paclitaxel, alcohol, polyoxyethylated castor oil, other chemotherapy agents, other medicines, foods, dyes, or preservatives -pregnant or trying to get pregnant -breast-feeding How should I use this medicine? This drug is given as an infusion into a vein. It is administered in a hospital or clinic by a specially trained health care professional. Talk to your pediatrician regarding the use of this medicine in children. Special care may be needed. Overdosage: If you think you have taken too much of this medicine contact a poison control center or emergency room at once. NOTE: This medicine is only for you. Do not share this medicine with others. What if I miss a dose? It is important not to miss your dose. Call your doctor or health care professional if you are unable to keep an appointment. What may interact with this medicine? Do not take this medicine with any of the following medications: -disulfiram -metronidazole This medicine may also interact with the following medications: -cyclosporine -dexamethasone -diazepam -ketoconazole -medicines to increase blood counts like filgrastim, pegfilgrastim, sargramostim -other chemotherapy drugs like cisplatin, doxorubicin, epirubicin, etoposide, teniposide, vincristine -quinidine -testosterone -vaccines -  verapamil Talk to your doctor or health care professional before taking any of these medicines: -acetaminophen -aspirin -ibuprofen -ketoprofen -naproxen This list may not describe  all possible interactions. Give your health care provider a list of all the medicines, herbs, non-prescription drugs, or dietary supplements you use. Also tell them if you smoke, drink alcohol, or use illegal drugs. Some items may interact with your medicine. What should I watch for while using this medicine? Your condition will be monitored carefully while you are receiving this medicine. You will need important blood work done while you are taking this medicine. This drug may make you feel generally unwell. This is not uncommon, as chemotherapy can affect healthy cells as well as cancer cells. Report any side effects. Continue your course of treatment even though you feel ill unless your doctor tells you to stop. In some cases, you may be given additional medicines to help with side effects. Follow all directions for their use. Call your doctor or health care professional for advice if you get a fever, chills or sore throat, or other symptoms of a cold or flu. Do not treat yourself. This drug decreases your body's ability to fight infections. Try to avoid being around people who are sick. This medicine may increase your risk to bruise or bleed. Call your doctor or health care professional if you notice any unusual bleeding. Be careful brushing and flossing your teeth or using a toothpick because you may get an infection or bleed more easily. If you have any dental work done, tell your dentist you are receiving this medicine. Avoid taking products that contain aspirin, acetaminophen, ibuprofen, naproxen, or ketoprofen unless instructed by your doctor. These medicines may hide a fever. Do not become pregnant while taking this medicine. Women should inform their doctor if they wish to become pregnant or think they might be pregnant. There is a potential for serious side effects to an unborn child. Talk to your health care professional or pharmacist for more information. Do not breast-feed an infant while  taking this medicine. Men are advised not to father a child while receiving this medicine. What side effects may I notice from receiving this medicine? Side effects that you should report to your doctor or health care professional as soon as possible: -allergic reactions like skin rash, itching or hives, swelling of the face, lips, or tongue -low blood counts - This drug may decrease the number of white blood cells, red blood cells and platelets. You may be at increased risk for infections and bleeding. -signs of infection - fever or chills, cough, sore throat, pain or difficulty passing urine -signs of decreased platelets or bleeding - bruising, pinpoint red spots on the skin, black, tarry stools, nosebleeds -signs of decreased red blood cells - unusually weak or tired, fainting spells, lightheadedness -breathing problems -chest pain -high or low blood pressure -mouth sores -nausea and vomiting -pain, swelling, redness or irritation at the injection site -pain, tingling, numbness in the hands or feet -slow or irregular heartbeat -swelling of the ankle, feet, hands Side effects that usually do not require medical attention (report to your doctor or health care professional if they continue or are bothersome): -bone pain -complete hair loss including hair on your head, underarms, pubic hair, eyebrows, and eyelashes -changes in the color of fingernails -diarrhea -loosening of the fingernails -loss of appetite -muscle or joint pain -red flush to skin -sweating This list may not describe all possible side effects. Call your doctor for medical advice   about side effects. You may report side effects to FDA at 1-800-FDA-1088. Where should I keep my medicine? This drug is given in a hospital or clinic and will not be stored at home. NOTE: This sheet is a summary. It may not cover all possible information. If you have questions about this medicine, talk to your doctor, pharmacist, or health care  provider.  2013, [employer]/Gold Standard. (03/21/2008 11:54:26 AM)  

## 2012-08-24 NOTE — Progress Notes (Signed)
Patient presents to the clinic today accompanied by his wife for PUT with Dr. Kathrynn Running. Patient requesting to see Dr. Kathrynn Running due to skin reaction on anterior neck. Patient alert and oriented to person, place, and time. No distress noted. Patient being pushed in wheelchair. Patient reports throat pain 8-9 on a scale of 0-10 despite taking morphine, percocet and carafate. Advised patient to take carafate 20 minutes before meals instead of one hour before. Also, advised patient to take percocet around the clock instead of allowing pain to get an 8 or 9 before taking anything. Hyperpigmentation with dry desquamation of anterior neck noted. Also, right upper back hyperpigmentation noted. Patient reports using radiaplex but, reports it burns now. Patient reports dry mouth. Patient reports dry persistent cough. Patient reports SOB continues. Patient requesting refill of morphine. Reported all findings to Dr. Kathrynn Running.

## 2012-08-24 NOTE — Progress Notes (Signed)
  Radiation Oncology         [phone] ________________________________  Name: Roger Walton MRN: [id number]  Date: 08/24/2012  DOB: 02/18/1956  Weekly Radiation Therapy Management  Current Dose: 18 Gy     Planned Dose:  25 Gy  Narrative . . . . . . . . The patient presents for routine under treatment assessment\                                   Patient presents to the clinic today accompanied by his wife for PUT. Patient requesting to see me due to skin reaction on anterior neck. Patient reports throat pain 8-9 on a scale of 0-10 despite taking morphine, percocet and carafate. Advised patient to take carafate 20 minutes before meals instead of one hour before. Also, advised patient to take percocet around the clock instead of allowing pain to get an 8 or 9 before taking anything. Hyperpigmentation with dry desquamation of anterior neck noted. Also, right upper back hyperpigmentation noted. Patient reports using radiaplex but, reports it burns now. Patient reports dry mouth. Patient reports dry persistent cough. Patient reports SOB continues. Patient requesting refill of morphine. Reported all findings to Dr. Kathrynn Running                                 Set-up films were reviewed.                                 The chart was checked. Physical Findings. . Patient alert and oriented to person, place, and time. No distress noted. Patient being pushed in wheelchair. Marland Kitchen  weight is 144 lb (65.318 kg). His oral temperature is 98.4 F (36.9 C). His blood pressure is 129/76 and his pulse is 89. His respiration is 18 and oxygen saturation is 100%. . Weight essentially stable.  No significant changes. Impression . . . . . . . The patient is  tolerating radiation. Plan . . . . . . . . . . . . Continue treatment as planned.[name] was given hydrogel pads and was instructed to put them on the open areas on his neck. He was advised not to secure them with tape so as not to cause further skin breakdown Also  recommended neosporin with lidcocaine and refilled morphine.  ________________________________  Artist Pais Kathrynn Running, M.D.

## 2012-08-24 NOTE — Progress Notes (Signed)
Per Dr. Kathrynn Running, Roger Walton was given hydrogel pads and was instructed to put them on the open areas on his neck.  He was advised not to secure them with tape so as not to cause further skin breakdown.

## 2012-08-25 ENCOUNTER — Ambulatory Visit
Admission: RE | Admit: 2012-08-25 | Discharge: 2012-08-25 | Disposition: A | Discharge status: Home / Self Care | Payer: Medicare Other | Source: Ambulatory Visit | Attending: Radiation Oncology | Admitting: Radiation Oncology

## 2012-08-26 ENCOUNTER — Ambulatory Visit
Admission: RE | Admit: 2012-08-26 | Discharge: 2012-08-26 | Disposition: A | Discharge status: Home / Self Care | Payer: Medicare Other | Source: Ambulatory Visit | Attending: Radiation Oncology | Admitting: Radiation Oncology

## 2012-08-27 ENCOUNTER — Ambulatory Visit
Admission: RE | Admit: 2012-08-27 | Discharge: 2012-08-27 | Disposition: A | Discharge status: Home / Self Care | Payer: Medicare Other | Source: Ambulatory Visit | Attending: Radiation Oncology | Admitting: Radiation Oncology

## 2012-08-27 ENCOUNTER — Ambulatory Visit: Payer: Medicare Other

## 2012-08-28 ENCOUNTER — Encounter: Payer: Self-pay | Admitting: Radiation Oncology

## 2012-08-28 ENCOUNTER — Ambulatory Visit: Payer: Medicare Other

## 2012-08-28 ENCOUNTER — Ambulatory Visit
Admission: RE | Admit: 2012-08-28 | Discharge: 2012-08-28 | Disposition: A | Discharge status: Home / Self Care | Payer: Medicare Other | Source: Ambulatory Visit | Attending: Radiation Oncology | Admitting: Radiation Oncology

## 2012-08-28 NOTE — Progress Notes (Signed)
Patient presents to the clinic today unaccompanied for PUT with Dr. Kathrynn Running. Patient alert and oriented to person, place, and time. No distress noted. Patient being pushed in wheelchair. Pleasant affect noted. Patient reports painful burning with urination 8 on a scale of 0-10 x 2 days. Hyperpigmentation with dry desquamation to anterior neck noted. Appearance of skin has greatly improved since the beginning of the week. Patient reports using neosporin and hydrogel pads as directed. Patient reports shortness of breath is better. Patient reports dry cough continues. Patient reports difficulty and pain continues to be associated with swallowing despite using carafate properly now. Reported all findings to Dr. Kathrynn Running.

## 2012-08-28 NOTE — Progress Notes (Signed)
  Radiation Oncology         [phone] ________________________________  Name: Roger Walton MRN: [id number]  Date: 08/28/2012  DOB: 12-01-1955  Weekly Radiation Therapy Management  Current Dose: 39.6 Gy     Planned Dose:  45 Gy  Narrative . . . . . . . . The patient presents for routine under treatment assessment.                                                     Patient presents to the clinic today unaccompanied for PUT with Dr. Kathrynn Running. Patient reports painful burning with urination 8 on a scale of 0-10 x 2 days. Hyperpigmentation with dry desquamation to anterior neck noted. Appearance of skin has greatly improved since the beginning of the week. Patient reports using neosporin and hydrogel pads as directed. Patient reports shortness of breath is better. Patient reports dry cough continues. Patient reports difficulty and pain continues to be associated with swallowing despite using carafate properly now.                                 Set-up films were reviewed.                                 The chart was checked. Physical Findings. . . Patient alert and oriented to person, place, and time. No distress noted. Patient being pushed in wheelchair. Pleasant affect noted. weight is 140 lb 4.8 oz (63.64 kg). His blood pressure is 92/69 and his pulse is 49. His respiration is 16 and oxygen saturation is 90%. . Weight essentially stable.  He has desquamation in the treatment field, which is not any worse. Impression . . . . . . . The patient is tolerating radiation. Plan . . . . . . . . . . . . Continue treatment as planned.  ________________________________  Artist Pais. Kathrynn Running, M.D.

## 2012-08-29 ENCOUNTER — Ambulatory Visit
Admission: RE | Admit: 2012-08-29 | Discharge: 2012-08-29 | Disposition: A | Discharge status: Home / Self Care | Payer: Medicare Other | Source: Ambulatory Visit | Attending: Radiation Oncology | Admitting: Radiation Oncology

## 2012-08-31 ENCOUNTER — Other Ambulatory Visit (HOSPITAL_BASED_OUTPATIENT_CLINIC_OR_DEPARTMENT_OTHER): Payer: Medicare Other

## 2012-08-31 ENCOUNTER — Ambulatory Visit
Admission: RE | Admit: 2012-08-31 | Discharge: 2012-08-31 | Disposition: A | Discharge status: Home / Self Care | Payer: Medicare Other | Source: Ambulatory Visit | Attending: Radiation Oncology | Admitting: Radiation Oncology

## 2012-08-31 ENCOUNTER — Telehealth: Payer: Self-pay | Admitting: Internal Medicine

## 2012-08-31 ENCOUNTER — Other Ambulatory Visit: Payer: Medicare Other | Admitting: Lab

## 2012-08-31 ENCOUNTER — Ambulatory Visit: Payer: Medicare Other

## 2012-08-31 ENCOUNTER — Ambulatory Visit (HOSPITAL_BASED_OUTPATIENT_CLINIC_OR_DEPARTMENT_OTHER): Payer: Medicare Other | Admitting: Internal Medicine

## 2012-08-31 ENCOUNTER — Encounter: Payer: Self-pay | Admitting: Internal Medicine

## 2012-08-31 DIAGNOSIS — C3490 Malignant neoplasm of unspecified part of unspecified bronchus or lung: Secondary | ICD-10-CM

## 2012-08-31 DIAGNOSIS — C3410 Malignant neoplasm of upper lobe, unspecified bronchus or lung: Secondary | ICD-10-CM

## 2012-08-31 LAB — CBC WITH DIFFERENTIAL/PLATELET
Basophils Absolute: 0 10*3/uL (ref 0.0–0.1)
EOS%: 0.6 % (ref 0.0–7.0)
HGB: 11.5 g/dL — ABNORMAL LOW (ref 13.0–17.1)
MCH: 33.3 pg (ref 27.2–33.4)
MCV: 95.1 fL (ref 79.3–98.0)
MONO%: 1.9 % (ref 0.0–14.0)
RDW: 13.5 % (ref 11.0–14.6)

## 2012-08-31 NOTE — Progress Notes (Signed)
Pt stated he has pain and his last pain med was last night at 6 pm. I instructed pt and wife to take MS contin every 12 hours 6 am and 6 pm.

## 2012-08-31 NOTE — Progress Notes (Signed)
[facility] [phone]   [phone]  OFFICE PROGRESS NOTE  Roger Sax, MD [address]  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    INTERVAL HISTORY: Roger Walton 57 y.o. male returns to the clinic today for followup visit accompanied his wife. The patient is feeling fine today with no specific complaints except for skin burns from the radiation treatment on the front of the right upper chest as well as the upper right back. The patient denied having any significant nausea or vomiting. He denied having any chest pain, shortness breath, cough or hemoptysis. He has no significant weight loss or night sweats. He has no fractions of radiotherapy this week to complete the course of his radiation treatment.  MEDICAL HISTORY: Past Medical History  Diagnosis Date  . 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 HIV infection   . SOB (shortness of breath)   . Chronic pain in right shoulder     scapula and arm for 2 months   . Anxiety     ALLERGIES:  has No Known Allergies.  MEDICATIONS:  Current Outpatient Prescriptions  Medication Sig Dispense Refill  . carbamazepine (TEGRETOL) 200 MG tablet take 3 tablets by mouth every morning 2 AT NOON and 3 tablets by mouth at bedtime  240 tablet  6  . efavirenz-emtricitabine-tenofovir (ATRIPLA) 600-200-300 MG per tablet Take 1 tablet by mouth at bedtime.      . hyaluronate sodium (RADIAPLEXRX) GEL Apply 1 application topically 2 (two) times daily. Apply to affected skin area after daily rad txs and prn, just not 4 hours prior to rad txs,      . LORazepam  (ATIVAN) 1 MG tablet Take 1 tablet (1 mg total) by mouth every 8 (eight) hours as needed for anxiety.  90 tablet  3  . morphine (MS CONTIN) 15 MG 12 hr tablet Take 1 tablet (15 mg total) by mouth 2 (two) times daily.  60 tablet  0  . nystatin (MYCOSTATIN) 100000 UNIT/ML suspension 5 ml swish and spit four times a day  180 mL  0  . PHENobarbital (LUMINAL) 64.8 MG tablet Take 1 tablet (64.8 mg total) by mouth at bedtime.  30 tablet  5  . sucralfate (CARAFATE) 1 G tablet Take 1 tablet (1 g total) by mouth 4 (four) times daily. Dissolve in 15 cc water.  120 tablet  1  . ibuprofen (ADVIL,MOTRIN) 600 MG tablet Take 1 tablet (600 mg total) by mouth every 8 (eight) hours as needed for pain.  15 tablet  0  . oxyCODONE-acetaminophen (PERCOCET/ROXICET) 5-325 MG per tablet Take 1 tablet by mouth now.  1 tablet  0  . prochlorperazine (COMPAZINE) 10 MG tablet Take 10 mg by mouth every 6 (six) hours as needed.       No current facility-administered medications for this visit.    REVIEW OF SYSTEMS:  A comprehensive review of systems was negative except for: Constitutional: positive for fatigue Skin burns on the upper right chest and back secondary to radiation.   PHYSICAL EXAMINATION: General appearance: alert, cooperative, fatigued and  no distress Head: Normocephalic, without obvious abnormality, atraumatic Neck: no adenopathy Lymph nodes: Cervical, supraclavicular, and axillary nodes normal. Resp: clear to auscultation bilaterally Cardio: regular rate and rhythm, S1, S2 normal, no murmur, click, rub or gallop GI: soft, non-tender; bowel sounds normal; no masses,  no organomegaly Extremities: extremities normal, atraumatic, no cyanosis or edema  ECOG PERFORMANCE STATUS: 1 - Symptomatic but completely ambulatory  Blood pressure 109/76, pulse 105, temperature 98.5 F (36.9 C), temperature source Oral, resp. rate 18, height 5\' 9"  (1.753 m), weight 137 lb 3.2 oz (62.234 kg).  LABORATORY DATA: Lab Results    Component Value Date   WBC 1.5* 08/31/2012   HGB 11.5* 08/31/2012   HCT 32.8* 08/31/2012   MCV 95.1 08/31/2012   PLT 120* 08/31/2012      Chemistry      Component Value Date/Time   NA 140 08/24/2012 1129   NA 137 07/18/2012 1759   K 5.0 08/24/2012 1129   K 3.9 07/18/2012 1759   CL 103 08/24/2012 1129   CL 101 07/18/2012 1759   CO2 28 08/24/2012 1129   CO2 27 07/18/2012 1759   BUN 18.7 08/24/2012 1129   BUN 10 07/18/2012 1759   CREATININE 0.9 08/24/2012 1129   CREATININE 0.85 07/18/2012 1759   CREATININE 1.03 06/29/2012 0958      Component Value Date/Time   CALCIUM 9.1 08/24/2012 1129   CALCIUM 9.2 07/18/2012 1759   ALKPHOS 113 08/24/2012 1129   ALKPHOS 79 06/29/2012 0958   AST 23 08/24/2012 1129   AST 25 06/29/2012 0958   ALT 17 08/24/2012 1129   ALT 14 06/29/2012 0958   BILITOT 0.31 08/24/2012 1129   BILITOT 0.3 06/29/2012 0958       RADIOGRAPHIC STUDIES: No results found.  ASSESSMENT: This is a very pleasant 58 years old [place] American male with history of stage IIB/IIIa non-small cell lung cancer, squamous cell carcinoma presenting as a right Pancoast tumor currently undergoing concurrent chemoradiation with weekly carboplatin and paclitaxel status post 5 weekly doses. His white blood count and absolute neutrophil count are low today.    PLAN: I recommended for the patient to proceed with the last 3 fractions of radiotherapy as scheduled this week. I would see him back for followup visit in 6 weeks with repeat CT scan of the chest for reevaluation of his disease. If the patient has significant improvement in his disease he will be considered for surgical evaluation for resection. He would like to this time to go to [place] and enjoy the summer holidays there. He was advised to call immediately if he has any concerning symptoms in the interval. All questions were answered. The patient knows to call the clinic with any problems, questions or concerns. We can certainly see the patient much sooner if  necessary.

## 2012-08-31 NOTE — Telephone Encounter (Signed)
gv and printeda ppt sched and avs pt was gonna be out of town on 6.23.14///   pt ok and aware

## 2012-08-31 NOTE — Patient Instructions (Addendum)
Followup visit in 6 weeks with repeat CT scan of the chest.

## 2012-08-31 NOTE — Progress Notes (Signed)
CBC reviewed by Dr. Arbutus Ped. Order received to cancel chemo today. Spoke with pt and wife in lobby. They understand and agree to plan. Neutropenic precautions reviewed. Pt voiced understanding.

## 2012-09-01 ENCOUNTER — Ambulatory Visit
Admission: RE | Admit: 2012-09-01 | Discharge: 2012-09-01 | Disposition: A | Discharge status: Home / Self Care | Payer: Medicare Other | Source: Ambulatory Visit | Attending: Radiation Oncology | Admitting: Radiation Oncology

## 2012-09-02 ENCOUNTER — Encounter: Payer: Self-pay | Admitting: Radiation Oncology

## 2012-09-02 ENCOUNTER — Ambulatory Visit
Admission: RE | Admit: 2012-09-02 | Discharge: 2012-09-02 | Disposition: A | Discharge status: Home / Self Care | Payer: Medicare Other | Source: Ambulatory Visit | Attending: Radiation Oncology | Admitting: Radiation Oncology

## 2012-09-02 NOTE — Progress Notes (Signed)
Patient presents to the clinic today accompanied by his wife for PUT with Dr. Kathrynn Running following final treatment. Patient alert and oriented to person, place, and time. No distress noted. Patient being pushed in wheelchair but able to stand for weight without assistance. Pleasant affect noted. Patient reports constant throat pain worse when he swallows 7 on a scale of 0-10 despite taking morphine 15 mg bid, percocet prn, and carafate. Patient reports painful and difficulty swallowing. Follow up appointment card given. Patient reports an energy level of 7 on a scale of 0-10. Patient denies cough. Patient denies shortness of breath. Patient reports dry mouth. Hyperpigmentation with dry desquamation of right anterior clavicular area and right upper back for which patient applies neosporin. Reported all findings to Dr. Kathrynn Running.

## 2012-09-03 ENCOUNTER — Ambulatory Visit: Payer: Medicare Other

## 2012-09-03 NOTE — Progress Notes (Signed)
  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 He also underwent cone beam CT image guided radiation therapy. His cone beam CT was used prior to each fraction to position the tumor within the target zone. His treatment was delivered using a 5 field beam arrangement initially using 6 megavolt photons for all fields. However, due to increased skin reaction his final 4 radiation treatments were delivered using higher energy 15 megavolt x-ray fields.  Narrative: The patient tolerated radiation treatment relatively well.   He experienced radiation dermatitis characterized as dry desquamation on the anterior and posterior neck with some foci of moist desquamation along the anterior neck. He also suffered with esophageal mucositis leaving to odynophasia and dysphagia.  Plan: The patient has completed radiation treatment. The patient will return to radiation oncology clinic for routine followup in one month. I advised him to call or return sooner if he has any questions or concerns related to his recovery or treatment. ________________________________  Artist Pais. Kathrynn Running, M.D.

## 2012-09-07 ENCOUNTER — Other Ambulatory Visit: Payer: Self-pay | Admitting: *Deleted

## 2012-09-07 ENCOUNTER — Other Ambulatory Visit: Payer: Medicare Other

## 2012-09-21 ENCOUNTER — Encounter: Payer: Self-pay | Admitting: Infectious Diseases

## 2012-09-21 ENCOUNTER — Ambulatory Visit (INDEPENDENT_AMBULATORY_CARE_PROVIDER_SITE_OTHER): Payer: Medicare Other | Admitting: Infectious Diseases

## 2012-09-21 ENCOUNTER — Encounter: Payer: Self-pay | Admitting: Internal Medicine

## 2012-09-21 VITALS — BP 120/71 | HR 84 | Temp 98.4°F | Wt 141.0 lb

## 2012-09-21 DIAGNOSIS — B20 Human immunodeficiency virus [HIV] disease: Secondary | ICD-10-CM

## 2012-09-21 DIAGNOSIS — C3410 Malignant neoplasm of upper lobe, unspecified bronchus or lung: Secondary | ICD-10-CM

## 2012-09-21 DIAGNOSIS — R569 Unspecified convulsions: Secondary | ICD-10-CM

## 2012-09-21 MED ORDER — SUCRALFATE 1 G PO TABS: 1.0000 g | ORAL_TABLET | Freq: Four times a day (QID) | ORAL | Status: DC

## 2012-09-21 NOTE — Progress Notes (Signed)
  Subjective:    Patient ID: Roger Walton, male    DOB: March 09, 1956, 57 y.o.   MRN: [id number]  HPI 57 yo M with HIV+ who has been maintained on [place].  Has previous genotype showing background PI mutations. Also hx of seizure disorder. Previous smoker.  Was seen in [place] in January with cough, returned in March with continued cough and found on CT to have R apex lung mass and horner's syndrome (for 2 months also). This was found on bx to be NSC lung CA (probable squamous cell). He has been getting CTX and XRT.   HIV 1 RNA Quant (copies/mL)  Date Value  06/29/2012 54*  02/19/2012 90*  08/05/2011 39*     CD4 T Cell Abs (cmm)  Date Value  06/29/2012 160*  02/19/2012 120*  08/05/2011 160*    Pharmacy notes today the significant interaction between tegretol and sustiva. He has burn marks on his chest and back.  Has "been keeping a low profile, staying at home in the bed". Has had sore throat since XRT.   Can't remember last neuro visit. Hasn't had seizure in ? Number of years.   Review of Systems  Constitutional: Positive for appetite change.  HENT: Positive for sore throat and trouble swallowing.        Objective:   Physical Exam  Constitutional: He appears well-developed and well-nourished.  HENT:  Mouth/Throat: No oropharyngeal exudate.  Eyes: EOM are normal. Pupils are equal, round, and reactive to light.  Neck: Neck supple.  Cardiovascular: Normal rate, regular rhythm and normal heart sounds.   Pulmonary/Chest: Effort normal and breath sounds normal.  Abdominal: Soft. Bowel sounds are normal. There is no tenderness.  Musculoskeletal: He exhibits no edema.  Lymphadenopathy:    He has no cervical adenopathy.  Skin:             Assessment & Plan:

## 2012-09-21 NOTE — Assessment & Plan Note (Signed)
He is doing ok, hopefully can get him off tegretol and this will improve his CD4 and VL. Will see him back in 3-4 months.

## 2012-09-21 NOTE — Addendum Note (Signed)
Addended by: HATCHER, JEFFREY C on: 09/21/2012 03:52 PM   Modules accepted: Orders

## 2012-09-21 NOTE — Progress Notes (Signed)
I called DSS and was told by Ms. Roselee Nova that patient did not return all paperwork for application. He does not have any medicaid [place] access active.

## 2012-09-21 NOTE — Progress Notes (Signed)
HPI: Roger Walton is a 57 y.o. male with HIV and distant hx of seizures.   Assessment: Patient has had low level viremia since 2010 and CD4 count of 160 which has not improved on therapy. He is currently on Atripla which significantly interacts with both the patient's carbamezapine and low dose phenobarbital causing low concentrations of the efaverinz portion of Atripla. Patient says he can't remember the last time he had a seizure or saw a neurologist. Changing the carbamezaine to something less likely to interact like Keppra would likely improve patient's response to ART.  Recommendations: - Patient should see a neurologist to consider change or trial off his anti-seizure medications.  - If patient needs to stay on the carbamezapine the addition of raltegravir should be considered due to it's low risk for interaction   Drue Stager, PharmD [facility] 09/21/2012, 4:36 PM

## 2012-09-21 NOTE — Assessment & Plan Note (Signed)
Appreciate the continued care of the h/o MDs.

## 2012-09-21 NOTE — Assessment & Plan Note (Signed)
He has not had seizure in years, will have him seen by Neuro. Try to stop tegretol if possible.

## 2012-09-24 ENCOUNTER — Telehealth: Payer: Self-pay | Admitting: *Deleted

## 2012-09-24 ENCOUNTER — Other Ambulatory Visit: Payer: Self-pay | Admitting: Licensed Clinical Social Worker

## 2012-09-24 DIAGNOSIS — B370 Candidal stomatitis: Secondary | ICD-10-CM

## 2012-09-24 MED ORDER — NYSTATIN 100000 UNIT/ML MT SUSP: OROMUCOSAL | Status: DC

## 2012-09-24 NOTE — Telephone Encounter (Signed)
Spoke with pt regarding [facility] appt 10/01/12 at 3:00 arrive at 2:45 with Dr. Dorris Fetch at [facility].  He verbalized understanding of time and place of appt

## 2012-09-28 ENCOUNTER — Encounter (HOSPITAL_COMMUNITY): Payer: Self-pay

## 2012-09-28 ENCOUNTER — Telehealth: Payer: Self-pay | Admitting: *Deleted

## 2012-09-28 ENCOUNTER — Ambulatory Visit (HOSPITAL_COMMUNITY)
Admission: RE | Admit: 2012-09-28 | Discharge: 2012-09-28 | Disposition: A | Discharge status: Home / Self Care | Payer: Medicare Other | Source: Ambulatory Visit | Attending: Radiation Oncology | Admitting: Radiation Oncology

## 2012-09-28 DIAGNOSIS — Z9221 Personal history of antineoplastic chemotherapy: Secondary | ICD-10-CM | POA: Insufficient documentation

## 2012-09-28 DIAGNOSIS — Z923 Personal history of irradiation: Secondary | ICD-10-CM | POA: Insufficient documentation

## 2012-09-28 DIAGNOSIS — C3490 Malignant neoplasm of unspecified part of unspecified bronchus or lung: Secondary | ICD-10-CM | POA: Insufficient documentation

## 2012-09-28 DIAGNOSIS — J438 Other emphysema: Secondary | ICD-10-CM | POA: Insufficient documentation

## 2012-09-28 DIAGNOSIS — D3500 Benign neoplasm of unspecified adrenal gland: Secondary | ICD-10-CM | POA: Insufficient documentation

## 2012-09-28 MED ORDER — IOHEXOL 300 MG/ML  SOLN
80.0000 mL | Freq: Once | INTRAMUSCULAR | Status: AC | PRN
  Administered 2012-09-28: 80 mL via INTRAVENOUS

## 2012-09-28 NOTE — Telephone Encounter (Signed)
Tried to call no answer

## 2012-09-29 ENCOUNTER — Telehealth: Payer: Self-pay | Admitting: *Deleted

## 2012-09-29 NOTE — Telephone Encounter (Signed)
Spoke with pt regarding appt change.  He will be seen at mt[facility] next week 10/08/12 instead of 10/01/12.  He verbalized understanding of appt of time and place of new appt

## 2012-10-08 ENCOUNTER — Encounter: Payer: Self-pay | Admitting: Thoracic Surgery (Cardiothoracic Vascular Surgery)

## 2012-10-08 ENCOUNTER — Ambulatory Visit (INDEPENDENT_AMBULATORY_CARE_PROVIDER_SITE_OTHER): Payer: Medicare Other | Admitting: Thoracic Surgery (Cardiothoracic Vascular Surgery)

## 2012-10-08 DIAGNOSIS — C3490 Malignant neoplasm of unspecified part of unspecified bronchus or lung: Secondary | ICD-10-CM

## 2012-10-08 NOTE — Progress Notes (Signed)
HPI:  Roger Walton is a 57 year old smoker who was diagnosed with a Pancoast tumor in March. He has been treated with combined chemoradiation. He has received 5 cycles of carboplatin and paclitaxel and 45 cGy of radiation. He had a repeat CT done on June 9. He now presents for evaluation is a surgical candidate.  Overall he tolerated the treatments fairly well. He did have desquamation of the skin from radiation and also had some radiation associated esophagitis. He says that he did not really experience much relief in terms of his arm pain from the treatment.  Past Medical History  Diagnosis Date  . 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 HIV infection   . SOB (shortness of breath)   . Chronic pain in right shoulder     scapula and arm for 2 months   . Anxiety       Current Outpatient Prescriptions  Medication Sig Dispense Refill  . carbamazepine (TEGRETOL) 200 MG tablet take 3 tablets by mouth every morning 2 AT NOON and 3 tablets by mouth at bedtime  240 tablet  6  . efavirenz-emtricitabine-tenofovir (ATRIPLA) 600-200-300 MG per tablet Take 1 tablet by mouth at bedtime.      Marland Kitchen nystatin (MYCOSTATIN) 100000 UNIT/ML suspension 5 ml swish and spit four times a day  180 mL  0  . PHENobarbital (LUMINAL) 64.8 MG tablet Take 1 tablet (64.8 mg total) by mouth at bedtime.  30 tablet  5   No current facility-administered medications for this visit.    Physical Exam BP 106/74  Pulse 94  Resp 16  Ht 5\' 9"  (1.753 m)  Wt 141 lb (63.957 kg)  BMI 20.81 kg/m2  SpO35 66% 57 year old male in no acute distress Neurologic alert and oriented x3 Cardiac regular rate and rhythm normal S1 and S2 Lungs clear with equal breath sounds bilaterally Palpable mass in right supraclavicular fossa Symmetrical 2+ radial pulses bilaterally  Diagnostic Tests: CT of chest 09/28/2012 *RADIOLOGY REPORT*  Clinical Data: Lung cancer with  chemotherapy and radiation therapy.  CT CHEST WITH CONTRAST  Technique: Multidetector CT imaging of the chest was performed  following the standard protocol during bolus administration of  intravenous contrast.  Contrast: 80mL OMNIPAQUE IOHEXOL 300 MG/ML SOLN  Comparison: PET 07/03/2012 and CT chest 06/24/2012.  Findings: Apical right upper lobe mass appears more prominent  within the right supraclavicular region, with obscuration of fat  planes and encasement of vessels (example image 4). Within the  apex of the right hemithorax, the soft tissue mass measures  approximately 3.6 x 4.5 cm (image 6), previously 3.4 x 4.1 cm. It  appears contiguous with the upper esophagus (example image 10).  Mediastinal lymph nodes measure up to 10 mm in the AP window,  likely stable. Right hilar lymphoid tissue measures 8 mm, stable.  Axillary lymph nodes have fatty hila and are not considered  enlarged. Heart size normal. No pericardial effusion.  Lungs are emphysematous. A smudgy nodular density along the minor  fissure (image 36) is unchanged. Additional scattered nodular  densities in the lungs measure up to 5 mm in the right lower lobe  (image 51), unchanged. A vague irregular opacity in the left upper  lobe (image 21) is difficult to measure and subjectively, appears  unchanged. No pleural fluid. Airway is unremarkable.  Incidental imaging of the upper abdomen shows no acute findings. A  1.9 cm  low attenuation lesion in the right adrenal glands is  unchanged. An intermediate density lesion in the spleen measures  2.8 cm, stable. No worrisome lytic or sclerotic lesions.  IMPRESSION:  1. Apical segment right upper lobe mass with infiltration into the  right supraclavicular fossa. Findings appears slightly progressive  from 06/24/2012.  2. Scattered nodular densities in the lungs, unchanged.  3. Right adrenal adenoma.  4. Intermediate density splenic lesion, better seen on today's  study and  unchanged in retrospect. Given the absence of  hypermetabolism on 07/03/2012, a hemangioma is favored.  Original Report Authenticated By: Leanna Battles, M.D.  Impression: 57 year old gentleman with a Pancoast tumor. He has had progression of disease despite combined chemoradiation. The tumor encases the subclavian artery and is adjacent to the trachea and esophagus. It extends from the mediastinum up into the neck.   Our hope going into treatment was that he would have a sufficient response to allow the tumor to be resected.  Unfortunately Roger Walton has not responded to chemotherapy radiation and his tumor is not resectable.  Plan: Followup with Dr. Kathrynn Running and Dr. Arbutus Ped to discuss further treatment.

## 2012-10-15 ENCOUNTER — Ambulatory Visit: Payer: Medicare Other | Admitting: Radiation Oncology

## 2012-10-15 ENCOUNTER — Encounter: Payer: Self-pay | Admitting: Radiation Oncology

## 2012-10-15 ENCOUNTER — Ambulatory Visit
Admission: RE | Admit: 2012-10-15 | Discharge: 2012-10-15 | Disposition: A | Discharge status: Home / Self Care | Payer: Medicare Other | Source: Ambulatory Visit | Attending: Radiation Oncology | Admitting: Radiation Oncology

## 2012-10-15 NOTE — Progress Notes (Signed)
Denies pain at this time. However, patient reports intermittent sternal and right arm pain. Denies painful or difficult swallowing. Hyperpigmentation without desquamation of anterior and posterior neck noted. Reports only apply neosporin to this area. Skin remain dry anterior and posterior neck therefore advised patient to apply coco butter lotion daily. Reports dry mouth for which he sips water regularly but, doesn't use biotene. Reports occasional dizziness. Weight stable. Denies shortness of breath or cough. Wife concerned she has noted her husband's right eye has been lazy for two days.

## 2012-10-15 NOTE — Progress Notes (Signed)
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  He the patient underwent followup chest CT which showed no significant radioactive response. He subsequently met with thoracic surgery. Because of his lack of significant response, patient remains inoperable.    He denies pain at this time. However, patient reports intermittent sternal and right arm pain. Denies painful or difficult swallowing. Hyperpigmentation without desquamation of anterior and posterior neck noted. Reports only apply neosporin to this area. Skin remain dry anterior and posterior neck therefore advised patient to apply coco butter lotion daily. Reports dry mouth for which he sips water regularly but, doesn't use biotene. Reports occasional dizziness.                           ALLERGIES:  has No Known Allergies.  Meds: Current Outpatient Prescriptions  Medication Sig Dispense Refill  . LORazepam (ATIVAN) 1 MG tablet Take 1 mg by mouth every 8 (eight) hours.      Marland Kitchen PHENobarbital (LUMINAL) 64.8 MG tablet Take 1 tablet (64.8 mg total) by mouth at bedtime.  30 tablet  5  . phenytoin (DILANTIN) 50 MG tablet Chew 50 mg by mouth 3 (three) times daily.      . carbamazepine (TEGRETOL) 200 MG tablet take 3 tablets by mouth every morning 2 AT NOON and 3 tablets by mouth at bedtime  240 tablet  6  . efavirenz-emtricitabine-tenofovir (ATRIPLA) 600-200-300 MG per tablet Take 1 tablet by mouth at bedtime.      Marland Kitchen nystatin  (MYCOSTATIN) 100000 UNIT/ML suspension 5 ml swish and spit four times a day  180 mL  0   No current facility-administered medications for this encounter.    Physical Findings: The patient is in no acute distress. Patient is alert and oriented.  weight is 140 lb 9.6 oz (63.776 kg). His oral temperature is 97.8 F (36.6 C). His blood pressure is 116/82 and his pulse is 83. His respiration is 16 and oxygen saturation is 100%.  he has hyperpigmentation within the radiation fields. He has no right upper extremity neurologic deficit.  No significant changes.  Lab Findings: Lab Results  Component Value Date   WBC 1.5* 08/31/2012   HGB 11.5* 08/31/2012   HCT 32.8* 08/31/2012   MCV 95.1 08/31/2012   PLT 120* 08/31/2012    @LASTCHEM @  Radiographic Findings: Ct Chest W Contrast  09/28/2012   *RADIOLOGY REPORT*  Clinical Data: Lung cancer with chemotherapy and radiation therapy.  CT CHEST WITH CONTRAST  Technique:  Multidetector CT imaging of the chest was performed following the standard protocol during bolus administration of intravenous contrast.  Contrast: 80mL OMNIPAQUE IOHEXOL 300 MG/ML  SOLN  Comparison: PET 07/03/2012 and CT chest 06/24/2012.  Findings: Apical right upper lobe mass appears more prominent within the right supraclavicular region, with obscuration of fat planes and encasement of vessels (example image 4).  Within the apex of the right hemithorax, the soft  tissue mass measures approximately 3.6 x 4.5 cm (image 6), previously 3.4 x 4.1 cm.  It appears contiguous with the upper esophagus (example image 10). Mediastinal lymph nodes measure up to 10 mm in the AP window, likely stable.  Right hilar lymphoid tissue measures 8 mm, stable. Axillary lymph nodes have fatty hila and are not considered enlarged.  Heart size normal.  No pericardial effusion.  Lungs are emphysematous.  A smudgy nodular density along the minor fissure (image 36) is unchanged.  Additional scattered nodular densities in  the lungs measure up to 5 mm in the right lower lobe (image 51), unchanged.  A vague irregular opacity in the left upper lobe (image 21) is difficult to measure and subjectively, appears unchanged.  No pleural fluid.  Airway is unremarkable.  Incidental imaging of the upper abdomen shows no acute findings.  A 1.9 cm low attenuation lesion in the right adrenal glands is unchanged.  An intermediate density lesion in the spleen measures 2.8 cm, stable.  No worrisome lytic or sclerotic lesions.  IMPRESSION:  1.  Apical segment right upper lobe mass with infiltration into the right supraclavicular fossa.  Findings appears slightly progressive from 06/24/2012. 2.  Scattered nodular densities in the lungs, unchanged. 3.  Right adrenal adenoma. 4.  Intermediate density splenic lesion, better seen on today's study and unchanged in retrospect. Given the absence of hypermetabolism on 07/03/2012, a hemangioma is favored.   Original Report Authenticated By: Leanna Battles, M.D.   Impression:  The patient is recovering from the effects of radiation.  Today, I talked to him about a radiation boost to provide more durable local control. Having just recovered from the side effects of radiation, patient refused to consider additional radiation treatment at this time.  Plan:  The patient will see medical oncology next week to discuss chemotherapy. I suspect he'll be eligible the future for salvage radiation to the right upper lung in the event of tumor recurrence. I will be happy to see him at that time, or any time I can be of assistance in his care.  _____________________________________  Artist Pais. Kathrynn Running, M.D.

## 2012-10-19 ENCOUNTER — Other Ambulatory Visit (HOSPITAL_BASED_OUTPATIENT_CLINIC_OR_DEPARTMENT_OTHER): Payer: Medicare Other | Admitting: Lab

## 2012-10-19 DIAGNOSIS — C3410 Malignant neoplasm of upper lobe, unspecified bronchus or lung: Secondary | ICD-10-CM

## 2012-10-19 LAB — CBC WITH DIFFERENTIAL/PLATELET
Basophils Absolute: 0 10*3/uL (ref 0.0–0.1)
EOS%: 3.7 % (ref 0.0–7.0)
Eosinophils Absolute: 0.1 10*3/uL (ref 0.0–0.5)
LYMPH%: 26.9 % (ref 14.0–49.0)
MCH: 36 pg — ABNORMAL HIGH (ref 27.2–33.4)
MCV: 103.1 fL — ABNORMAL HIGH (ref 79.3–98.0)
MONO%: 13.8 % (ref 0.0–14.0)
Platelets: 237 10*3/uL (ref 140–400)
RBC: 3.59 10*6/uL — ABNORMAL LOW (ref 4.20–5.82)
RDW: 17.2 % — ABNORMAL HIGH (ref 11.0–14.6)

## 2012-10-19 LAB — COMPREHENSIVE METABOLIC PANEL (CC13)
AST: 22 U/L (ref 5–34)
Albumin: 3.3 g/dL — ABNORMAL LOW (ref 3.5–5.0)
Alkaline Phosphatase: 86 U/L (ref 40–150)
BUN: 19.3 mg/dL (ref 7.0–26.0)
Glucose: 100 mg/dl (ref 70–140)
Potassium: 4.2 mEq/L (ref 3.5–5.1)
Total Bilirubin: 0.21 mg/dL (ref 0.20–1.20)

## 2012-10-21 ENCOUNTER — Encounter: Payer: Self-pay | Admitting: Internal Medicine

## 2012-10-21 ENCOUNTER — Ambulatory Visit (HOSPITAL_BASED_OUTPATIENT_CLINIC_OR_DEPARTMENT_OTHER): Payer: Medicare Other | Admitting: Internal Medicine

## 2012-10-21 VITALS — BP 123/97 | HR 101 | Temp 98.5°F | Resp 18 | Ht 69.0 in | Wt 142.8 lb

## 2012-10-21 DIAGNOSIS — C3491 Malignant neoplasm of unspecified part of right bronchus or lung: Secondary | ICD-10-CM

## 2012-10-21 DIAGNOSIS — C3490 Malignant neoplasm of unspecified part of unspecified bronchus or lung: Secondary | ICD-10-CM

## 2012-10-21 NOTE — Progress Notes (Signed)
[facility] [phone]   [phone]  OFFICE PROGRESS NOTE  [name], MD [address]  [address] [place] [address]  DIAGNOSIS: Stage IIB/IIIA non-small cell lung cancer, squamous cell carcinoma presenting as a right Pancoast tumor   PRIOR THERAPY:  Concurrent chemoradiation with weekly carboplatin for AUC of 2 and paclitaxol 45 mg/M2, status post 5 weekly doses. Last dose was given on 08/24/2012 with no significant response.   CURRENT THERAPY: The patient will start next week the first cycle of systemic chemotherapy with carboplatin for AUC of 5 on day 1 and gemcitabine 1000 mg/M2 on days 1 and 8 every 3 weeks. First dose expected on 10/28/2012  INTERVAL HISTORY: Roger Walton 57 y.o. male returns to the clinic today for followup visit accompanied by his wife. The patient tolerated the course of concurrent chemoradiation with weekly carboplatin and paclitaxel fairly well. He continues to have skin burns from the radiation therapy. His recent CT scan of the chest showed no significant disease improvement in the right Pancoast tumor. The patient was seen by Dr. Dorris Fetch and was not considered for surgical resection because of the locally advanced disease. He was also seen by Dr. Kathrynn Running and also referred radiation boost but the patient declined it. He is here today for evaluation and discussion of treatment options. He denied having any significant weight loss or night sweats. He denied having any shortness breath, cough or hemoptysis. He has no nausea or vomiting. He denied having any significant weight loss or night sweats.  MEDICAL HISTORY: Past Medical History  Diagnosis Date  . 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 HIV infection   . SOB (shortness of breath)   . Chronic pain in right shoulder     scapula and arm for 2 months   . Anxiety      ALLERGIES:  has No Known Allergies.  MEDICATIONS:  Current Outpatient Prescriptions  Medication Sig Dispense Refill  . carbamazepine (TEGRETOL) 200 MG tablet take 3 tablets by mouth every morning 2 AT NOON and 3 tablets by mouth at bedtime  240 tablet  6  . efavirenz-emtricitabine-tenofovir (ATRIPLA) 600-200-300 MG per tablet Take 1 tablet by mouth at bedtime.      Marland Kitchen LORazepam (ATIVAN) 1 MG tablet Take 1 mg by mouth every 8 (eight) hours.      Marland Kitchen nystatin (MYCOSTATIN) 100000 UNIT/ML suspension 5 ml swish and spit four times a day  180 mL  0  . PHENobarbital (LUMINAL) 64.8 MG tablet Take 1 tablet (64.8 mg total) by mouth at bedtime.  30 tablet  5   No current facility-administered medications for this visit.    REVIEW OF SYSTEMS:  A comprehensive review of systems was negative.   PHYSICAL EXAMINATION: General appearance: alert, cooperative and no distress Head: Normocephalic, without obvious abnormality, atraumatic Neck: no adenopathy Lymph nodes: Cervical, supraclavicular, and axillary nodes normal. Resp: clear to auscultation bilaterally Cardio: regular rate and rhythm, S1, S2 normal, no murmur, click, rub or gallop GI: soft, non-tender; bowel sounds normal; no masses,  no organomegaly Extremities: extremities normal, atraumatic, no cyanosis or edema Neurologic: Alert and oriented X 3, normal strength and tone. Normal symmetric reflexes. Normal coordination and gait  ECOG PERFORMANCE STATUS: 1 - Symptomatic but completely ambulatory  Blood pressure 123/97, pulse 101, temperature 98.5 F (36.9 C),  temperature source Oral, resp. rate 18, height 5\' 9"  (1.753 m), weight 142 lb 12.8 oz (64.774 kg).  LABORATORY DATA: Lab Results  Component Value Date   WBC 3.6* 10/19/2012   HGB 12.9* 10/19/2012   HCT 37.0* 10/19/2012   MCV 103.1* 10/19/2012   PLT 237 10/19/2012      Chemistry      Component Value Date/Time   NA 142 10/19/2012 1202   NA 137 07/18/2012 1759   K 4.2 10/19/2012 1202    K 3.9 07/18/2012 1759   CL 103 08/24/2012 1129   CL 101 07/18/2012 1759   CO2 26 10/19/2012 1202   CO2 27 07/18/2012 1759   BUN 19.3 10/19/2012 1202   BUN 10 07/18/2012 1759   CREATININE 1.0 10/19/2012 1202   CREATININE 0.85 07/18/2012 1759   CREATININE 1.03 06/29/2012 0958      Component Value Date/Time   CALCIUM 8.5 10/19/2012 1202   CALCIUM 9.2 07/18/2012 1759   ALKPHOS 86 10/19/2012 1202   ALKPHOS 79 06/29/2012 0958   AST 22 10/19/2012 1202   AST 25 06/29/2012 0958   ALT 14 10/19/2012 1202   ALT 14 06/29/2012 0958   BILITOT 0.21 10/19/2012 1202   BILITOT 0.3 06/29/2012 0958       RADIOGRAPHIC STUDIES: Ct Chest W Contrast  09/28/2012   *RADIOLOGY REPORT*  Clinical Data: Lung cancer with chemotherapy and radiation therapy.  CT CHEST WITH CONTRAST  Technique:  Multidetector CT imaging of the chest was performed following the standard protocol during bolus administration of intravenous contrast.  Contrast: 80mL OMNIPAQUE IOHEXOL 300 MG/ML  SOLN  Comparison: PET 07/03/2012 and CT chest 06/24/2012.  Findings: Apical right upper lobe mass appears more prominent within the right supraclavicular region, with obscuration of fat planes and encasement of vessels (example image 4).  Within the apex of the right hemithorax, the soft tissue mass measures approximately 3.6 x 4.5 cm (image 6), previously 3.4 x 4.1 cm.  It appears contiguous with the upper esophagus (example image 10). Mediastinal lymph nodes measure up to 10 mm in the AP window, likely stable.  Right hilar lymphoid tissue measures 8 mm, stable. Axillary lymph nodes have fatty hila and are not considered enlarged.  Heart size normal.  No pericardial effusion.  Lungs are emphysematous.  A smudgy nodular density along the minor fissure (image 36) is unchanged.  Additional scattered nodular densities in the lungs measure up to 5 mm in the right lower lobe (image 51), unchanged.  A vague irregular opacity in the left upper lobe (image 21) is difficult to  measure and subjectively, appears unchanged.  No pleural fluid.  Airway is unremarkable.  Incidental imaging of the upper abdomen shows no acute findings.  A 1.9 cm low attenuation lesion in the right adrenal glands is unchanged.  An intermediate density lesion in the spleen measures 2.8 cm, stable.  No worrisome lytic or sclerotic lesions.  IMPRESSION:  1.  Apical segment right upper lobe mass with infiltration into the right supraclavicular fossa.  Findings appears slightly progressive from 06/24/2012. 2.  Scattered nodular densities in the lungs, unchanged. 3.  Right adrenal adenoma. 4.  Intermediate density splenic lesion, better seen on today's study and unchanged in retrospect. Given the absence of hypermetabolism on 07/03/2012, a hemangioma is favored.   Original Report Authenticated By: Leanna Battles, M.D.    ASSESSMENT AND PLAN: This is a very pleasant 57 years old African American male with a right Pancoast tumor status post concurrent chemoradiation  but unfortunately has no significant regression of his disease. He is not a surgical candidate for resection at this point.  I have a lengthy discussion with the patient and his wife about his treatment options. I offered the patient systemic chemotherapy with carboplatin for AUC of 5 on day 1 and gemcitabine 1000 mg/M2 on days 1 and 8 every 3 weeks. I discussed with the patient adverse effect of the chemotherapy including but not limited to alopecia, myelosuppression, nausea and vomiting, peripheral neuropathy, liver or renal dysfunction. He would like to proceed with treatment as planned. He is expected to start the first cycle of his treatment next week. The patient would come back for followup visit in 2 weeks for evaluation and management any adverse effect of his chemotherapy. He was advised to call immediately if he has any concerning symptoms in the interval. All questions were answered. The patient knows to call the clinic with any problems,  questions or concerns. We can certainly see the patient much sooner if necessary.  I spent 15 minutes counseling the patient face to face. The total time spent in the appointment was 25 minutes.

## 2012-10-21 NOTE — Patient Instructions (Signed)
We discussed the results of the CT scan of the chest. I recommended for you systemic chemotherapy with carboplatin and gemcitabine. Followup visit in 2 weeks.

## 2012-10-26 ENCOUNTER — Telehealth: Payer: Self-pay | Admitting: Internal Medicine

## 2012-10-28 ENCOUNTER — Other Ambulatory Visit (HOSPITAL_BASED_OUTPATIENT_CLINIC_OR_DEPARTMENT_OTHER): Payer: Medicare Other | Admitting: Lab

## 2012-10-28 ENCOUNTER — Ambulatory Visit (HOSPITAL_BASED_OUTPATIENT_CLINIC_OR_DEPARTMENT_OTHER): Payer: Medicare Other

## 2012-10-28 DIAGNOSIS — C3410 Malignant neoplasm of upper lobe, unspecified bronchus or lung: Secondary | ICD-10-CM

## 2012-10-28 DIAGNOSIS — C3490 Malignant neoplasm of unspecified part of unspecified bronchus or lung: Secondary | ICD-10-CM

## 2012-10-28 DIAGNOSIS — Z5111 Encounter for antineoplastic chemotherapy: Secondary | ICD-10-CM

## 2012-10-28 LAB — CBC WITH DIFFERENTIAL/PLATELET
Basophils Absolute: 0 10*3/uL (ref 0.0–0.1)
EOS%: 2.7 % (ref 0.0–7.0)
Eosinophils Absolute: 0.1 10*3/uL (ref 0.0–0.5)
HCT: 38.8 % (ref 38.4–49.9)
HGB: 13.3 g/dL (ref 13.0–17.1)
LYMPH%: 30.7 % (ref 14.0–49.0)
MCH: 35 pg — ABNORMAL HIGH (ref 27.2–33.4)
MCV: 102.1 fL — ABNORMAL HIGH (ref 79.3–98.0)
MONO%: 13.7 % (ref 0.0–14.0)
NEUT#: 1.6 10*3/uL (ref 1.5–6.5)
NEUT%: 51.9 % (ref 39.0–75.0)
Platelets: 229 10*3/uL (ref 140–400)

## 2012-10-28 LAB — COMPREHENSIVE METABOLIC PANEL (CC13)
AST: 22 U/L (ref 5–34)
Albumin: 3.4 g/dL — ABNORMAL LOW (ref 3.5–5.0)
Alkaline Phosphatase: 89 U/L (ref 40–150)
BUN: 13.3 mg/dL (ref 7.0–26.0)
Creatinine: 1 mg/dL (ref 0.7–1.3)
Glucose: 119 mg/dl (ref 70–140)
Potassium: 3.8 mEq/L (ref 3.5–5.1)
Total Bilirubin: 0.39 mg/dL (ref 0.20–1.20)

## 2012-10-28 MED ORDER — SODIUM CHLORIDE 0.9 % IV SOLN
490.0000 mg | Freq: Once | INTRAVENOUS | Status: AC
  Administered 2012-10-28: 490 mg via INTRAVENOUS
  Filled 2012-10-28: qty 49

## 2012-10-28 MED ORDER — SODIUM CHLORIDE 0.9 % IV SOLN
Freq: Once | INTRAVENOUS | Status: AC
  Administered 2012-10-28: 09:00:00 via INTRAVENOUS

## 2012-10-28 MED ORDER — SODIUM CHLORIDE 0.9 % IV SOLN
1000.0000 mg/m2 | Freq: Once | INTRAVENOUS | Status: AC
  Administered 2012-10-28: 1786 mg via INTRAVENOUS
  Filled 2012-10-28: qty 47

## 2012-10-28 MED ORDER — ONDANSETRON 16 MG/50ML IVPB (CHCC)
16.0000 mg | Freq: Once | INTRAVENOUS | Status: AC
  Administered 2012-10-28: 16 mg via INTRAVENOUS

## 2012-10-28 MED ORDER — DEXAMETHASONE SODIUM PHOSPHATE 20 MG/5ML IJ SOLN
20.0000 mg | Freq: Once | INTRAMUSCULAR | Status: AC
  Administered 2012-10-28: 20 mg via INTRAVENOUS

## 2012-10-28 NOTE — Patient Instructions (Addendum)
[facility] Discharge Instructions for Patients Receiving Chemotherapy  Today you received the following chemotherapy agents: Gemzar, Carboplatin. To help prevent nausea and vomiting after your treatment, we encourage you to take your nausea medication.   If you develop nausea and vomiting that is not controlled by your nausea medication, call the clinic.   BELOW ARE SYMPTOMS THAT SHOULD BE REPORTED IMMEDIATELY:  *FEVER GREATER THAN 100.5 F  *CHILLS WITH OR WITHOUT FEVER  NAUSEA AND VOMITING THAT IS NOT CONTROLLED WITH YOUR NAUSEA MEDICATION  *UNUSUAL SHORTNESS OF BREATH  *UNUSUAL BRUISING OR BLEEDING  TENDERNESS IN MOUTH AND THROAT WITH OR WITHOUT PRESENCE OF ULCERS  *URINARY PROBLEMS  *BOWEL PROBLEMS  UNUSUAL RASH Items with * indicate a potential emergency and should be followed up as soon as possible.  Feel free to call the clinic you have any questions or concerns. The clinic phone number is [phone].    

## 2012-10-29 ENCOUNTER — Telehealth: Payer: Self-pay | Admitting: *Deleted

## 2012-10-29 NOTE — Telephone Encounter (Signed)
Called COURVOISIER HAMBLEN at [phone] number(s).  Messge left requesting a return call for chemotherapy follow up.  Awaiting return call from patient.

## 2012-10-29 NOTE — Telephone Encounter (Signed)
Message copied by Augusto Garbe on Thu Oct 29, 2012  3:43 PM ------      Message from: Barbara Cower      Created: Wed Oct 28, 2012  9:11 AM      Regarding: chemo followup       1st gemzar (and [place], but has had [place] before)      tx change      Dr. Arbutus Ped ------

## 2012-11-04 ENCOUNTER — Encounter: Payer: Self-pay | Admitting: Physician Assistant

## 2012-11-04 ENCOUNTER — Ambulatory Visit: Payer: Medicare Other

## 2012-11-04 ENCOUNTER — Other Ambulatory Visit (HOSPITAL_BASED_OUTPATIENT_CLINIC_OR_DEPARTMENT_OTHER): Payer: Medicare Other | Admitting: Lab

## 2012-11-04 ENCOUNTER — Ambulatory Visit: Payer: Medicare Other | Admitting: Internal Medicine

## 2012-11-04 ENCOUNTER — Ambulatory Visit (HOSPITAL_BASED_OUTPATIENT_CLINIC_OR_DEPARTMENT_OTHER): Payer: Medicare Other | Admitting: Physician Assistant

## 2012-11-04 VITALS — BP 120/79 | HR 85 | Temp 98.3°F | Resp 20 | Ht 69.0 in | Wt 138.0 lb

## 2012-11-04 DIAGNOSIS — T451X5A Adverse effect of antineoplastic and immunosuppressive drugs, initial encounter: Secondary | ICD-10-CM

## 2012-11-04 DIAGNOSIS — C3410 Malignant neoplasm of upper lobe, unspecified bronchus or lung: Secondary | ICD-10-CM

## 2012-11-04 DIAGNOSIS — D702 Other drug-induced agranulocytosis: Secondary | ICD-10-CM

## 2012-11-04 DIAGNOSIS — C3491 Malignant neoplasm of unspecified part of right bronchus or lung: Secondary | ICD-10-CM

## 2012-11-04 LAB — CBC WITH DIFFERENTIAL/PLATELET
Basophils Absolute: 0 10*3/uL (ref 0.0–0.1)
Eosinophils Absolute: 0 10*3/uL (ref 0.0–0.5)
HCT: 36 % — ABNORMAL LOW (ref 38.4–49.9)
HGB: 12.4 g/dL — ABNORMAL LOW (ref 13.0–17.1)
MCV: 101.1 fL — ABNORMAL HIGH (ref 79.3–98.0)
MONO%: 8.4 % (ref 0.0–14.0)
NEUT#: 0.3 10*3/uL — CL (ref 1.5–6.5)
NEUT%: 29.5 % — ABNORMAL LOW (ref 39.0–75.0)
RDW: 13.6 % (ref 11.0–14.6)

## 2012-11-04 MED ORDER — CIPROFLOXACIN HCL 500 MG PO TABS: 500.0000 mg | ORAL_TABLET | Freq: Two times a day (BID) | ORAL | Status: DC

## 2012-11-04 NOTE — Patient Instructions (Addendum)
Take antibiotic as prescribed Continue with weekly labs as scheduled Follow neutropenic precautions as instructed. Call for fever of 100.5 or higher Follow up in 2 weeks

## 2012-11-04 NOTE — Progress Notes (Addendum)
[facility] [phone]   [phone]  SHARED VISIT PROGRESS NOTE  [name], MD [address]  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 Last dose was given on 08/24/2012 with no significant response.   CURRENT THERAPY:  Systemic chemotherapy with carboplatin for AUC of 5 on day 1 and gemcitabine 1000 mg/M2 on days 1 and 8 every 3 weeks. First dose given on 10/28/2012  INTERVAL HISTORY: ZYREN SEVIGNY 57 y.o. male returns to the clinic today for followup visit accompanied by his wife for a symptom management visit after receiving day 1 of cycle 1 of his systemic chemotherapy with carboplatin and gemcitabine. Overall he tolerated his first cycle of chemotherapy with carboplatin and gemcitabine relatively well. He did have some nausea and vomiting but it was well controlled with his current anti-emetic.He reports some dizziness with changes in position. He denied any fever, chills, diarrhea, or constipation. He denied having any significant weight loss or night sweats. He denied having any shortness breath, cough or hemoptysis. He has no nausea or vomiting. He denied having any significant weight loss or night sweats.  MEDICAL HISTORY: Past Medical History  Diagnosis Date  . 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 HIV infection   . SOB (shortness of breath)   . Chronic pain in right shoulder     scapula and arm for 2 months   . Anxiety     ALLERGIES:  has No Known Allergies.  MEDICATIONS:  Current Outpatient Prescriptions  Medication Sig Dispense Refill  . carbamazepine (TEGRETOL) 200 MG tablet take 3 tablets  by mouth every morning 2 AT NOON and 3 tablets by mouth at bedtime  240 tablet  6  . ciprofloxacin (CIPRO) 500 MG tablet Take 1 tablet (500 mg total) by mouth 2 (two) times daily.  10 tablet  0  . efavirenz-emtricitabine-tenofovir (ATRIPLA) 600-200-300 MG per tablet Take 1 tablet by mouth at bedtime.      Marland Kitchen LORazepam (ATIVAN) 1 MG tablet Take 1 mg by mouth every 8 (eight) hours.      Marland Kitchen nystatin (MYCOSTATIN) 100000 UNIT/ML suspension 5 ml swish and spit four times a day  180 mL  0  . PHENobarbital (LUMINAL) 64.8 MG tablet Take 1 tablet (64.8 mg total) by mouth at bedtime.  30 tablet  5   No current facility-administered medications for this visit.    REVIEW OF SYSTEMS:  A comprehensive review of systems was negative.   PHYSICAL EXAMINATION: General appearance: alert, cooperative and no distress Head: Normocephalic, without obvious abnormality, atraumatic Neck: no adenopathy Lymph nodes: Cervical, supraclavicular, and axillary nodes normal. Resp: clear to auscultation bilaterally Cardio: regular rate and rhythm, S1, S2 normal, no murmur, click, rub or gallop GI: soft, non-tender; bowel sounds normal; no masses,  no organomegaly Extremities: extremities normal, atraumatic, no cyanosis or edema Neurologic: Alert and oriented X 3, normal strength and tone. Normal symmetric reflexes. Normal coordination and gait  ECOG PERFORMANCE STATUS: 1 - Symptomatic but completely ambulatory  Blood pressure 120/79, pulse 85, temperature 98.3 F (36.8 C), temperature source Oral, resp. rate 20, height 5\' 9"  (  1.753 m), weight 138 lb (62.596 kg).  LABORATORY DATA: Lab Results  Component Value Date   WBC 1.0* 11/04/2012   HGB 12.4* 11/04/2012   HCT 36.0* 11/04/2012   MCV 101.1* 11/04/2012   PLT 128* 11/04/2012      Chemistry      Component Value Date/Time   NA 142 10/28/2012 0804   NA 137 07/18/2012 1759   K 3.8 10/28/2012 0804   K 3.9 07/18/2012 1759   CL 103 08/24/2012 1129   CL 101 07/18/2012 1759   CO2  24 10/28/2012 0804   CO2 27 07/18/2012 1759   BUN 13.3 10/28/2012 0804   BUN 10 07/18/2012 1759   CREATININE 1.0 10/28/2012 0804   CREATININE 0.85 07/18/2012 1759   CREATININE 1.03 06/29/2012 0958      Component Value Date/Time   CALCIUM 9.0 10/28/2012 0804   CALCIUM 9.2 07/18/2012 1759   ALKPHOS 89 10/28/2012 0804   ALKPHOS 79 06/29/2012 0958   AST 22 10/28/2012 0804   AST 25 06/29/2012 0958   ALT 15 10/28/2012 0804   ALT 14 06/29/2012 0958   BILITOT 0.39 10/28/2012 0804   BILITOT 0.3 06/29/2012 0958       RADIOGRAPHIC STUDIES: Ct Chest W Contrast  09/28/2012   *RADIOLOGY REPORT*  Clinical Data: Lung cancer with chemotherapy and radiation therapy.  CT CHEST WITH CONTRAST  Technique:  Multidetector CT imaging of the chest was performed following the standard protocol during bolus administration of intravenous contrast.  Contrast: 80mL OMNIPAQUE IOHEXOL 300 MG/ML  SOLN  Comparison: PET 07/03/2012 and CT chest 06/24/2012.  Findings: Apical right upper lobe mass appears more prominent within the right supraclavicular region, with obscuration of fat planes and encasement of vessels (example image 4).  Within the apex of the right hemithorax, the soft tissue mass measures approximately 3.6 x 4.5 cm (image 6), previously 3.4 x 4.1 cm.  It appears contiguous with the upper esophagus (example image 10). Mediastinal lymph nodes measure up to 10 mm in the AP window, likely stable.  Right hilar lymphoid tissue measures 8 mm, stable. Axillary lymph nodes have fatty hila and are not considered enlarged.  Heart size normal.  No pericardial effusion.  Lungs are emphysematous.  A smudgy nodular density along the minor fissure (image 36) is unchanged.  Additional scattered nodular densities in the lungs measure up to 5 mm in the right lower lobe (image 51), unchanged.  A vague irregular opacity in the left upper lobe (image 21) is difficult to measure and subjectively, appears unchanged.  No pleural fluid.  Airway is unremarkable.   Incidental imaging of the upper abdomen shows no acute findings.  A 1.9 cm low attenuation lesion in the right adrenal glands is unchanged.  An intermediate density lesion in the spleen measures 2.8 cm, stable.  No worrisome lytic or sclerotic lesions.  IMPRESSION:  1.  Apical segment right upper lobe mass with infiltration into the right supraclavicular fossa.  Findings appears slightly progressive from 06/24/2012. 2.  Scattered nodular densities in the lungs, unchanged. 3.  Right adrenal adenoma. 4.  Intermediate density splenic lesion, better seen on today's study and unchanged in retrospect. Given the absence of hypermetabolism on 07/03/2012, a hemangioma is favored.   Original Report Authenticated By: Leanna Battles, M.D.    ASSESSMENT AND PLAN: This is a very pleasant 57 years old African American male with a right Pancoast tumor status post concurrent chemoradiation but unfortunately has no significant regression of his disease. He is  not a surgical candidate for resection at this point. He is currently being treated with systemic chemotherapy with carboplatin for AUC of 5 on day 1 and gemcitabine 1000 mg/M2 on days 1 and 8 every 3 weeks. The patient was discussed with and seen by Dr. Arbutus Ped. He is neutropenic today with a total white count of 1.0 and an ANC of 0.3. To address his chemotherapy induced neutropenia he will be given 3 days of Neupogen at 300 mcg subcutaneously. He will also be placed on a 5 day empiric course of Cipro at 500 mg by mouth twice daily. Neutropenic precautions were reviewed with the patient and his wife and both voiced understanding. He will continue with weekly labs as scheduled and return in 2 weeks prior to the start of cycle #2. He will not receive cycle 1 day 8 due to his neutropenia. The patient was given an oral thermometer and instructed to call for fever of 100.5 or greater. He was cautioned to take time between position changes to allow his body time to  adjust.  Laural Benes, Fernie Grimm E, PA-C    He was advised to call immediately if he has any concerning symptoms in the interval. All questions were answered. The patient knows to call the clinic with any problems, questions or concerns. We can certainly see the patient much sooner if necessary.  I spent 15 minutes counseling the patient face to face. The total time spent in the appointment was 25 minutes.  ADDENDUM: Hematology/Oncology Attending: I have a face to face encounter with the patient today. He was accompanied by his wife. I recommended his care plan. He is currently undergoing systemic chemotherapy with carboplatin and gemcitabine for progressive non-small cell lung cancer. He received only 1 dose of this treatment but the patient has significant neutropenia. We will hold day 8 of his chemotherapy for now. The patient will be started on Neupogen 300 mcg subcutaneously for the next 3 days. We will also cover the patient with empiric antibiotic coverage with Cipro 500 mg by mouth twice a day for 5 days. He would come back for follow up visit in 2 weeks for evaluation before starting cycle #2. He was advised to call immediately if he has any concerning symptoms in the interval. Lajuana Matte., MD 11/04/2012

## 2012-11-05 ENCOUNTER — Ambulatory Visit (HOSPITAL_BASED_OUTPATIENT_CLINIC_OR_DEPARTMENT_OTHER): Payer: Medicare Other

## 2012-11-05 ENCOUNTER — Other Ambulatory Visit: Payer: Self-pay | Admitting: Physician Assistant

## 2012-11-05 ENCOUNTER — Telehealth: Payer: Self-pay | Admitting: Internal Medicine

## 2012-11-05 ENCOUNTER — Other Ambulatory Visit: Payer: Self-pay | Admitting: *Deleted

## 2012-11-05 VITALS — BP 118/67 | HR 95 | Temp 98.3°F

## 2012-11-05 DIAGNOSIS — D701 Agranulocytosis secondary to cancer chemotherapy: Secondary | ICD-10-CM

## 2012-11-05 DIAGNOSIS — D702 Other drug-induced agranulocytosis: Secondary | ICD-10-CM

## 2012-11-05 MED ORDER — FILGRASTIM 300 MCG/0.5ML IJ SOLN
300.0000 ug | Freq: Once | INTRAMUSCULAR | Status: AC
  Administered 2012-11-05: 300 ug via SUBCUTANEOUS
  Filled 2012-11-05: qty 0.5

## 2012-11-05 NOTE — Telephone Encounter (Signed)
Pt came to scheduling and stated he was called in for inj appt. S/w desk nurse and pof sent for inj appts 7/17, 7/18, 7/19. Added inj appts and appts as per 7/16 pof. Pt given appt schedule for July and August.

## 2012-11-05 NOTE — Patient Instructions (Signed)
Filgrastim, G-CSF injection What is this medicine? FILGRASTIM, G-CSF (fil GRA stim) stimulates the formation of white blood cells. This medicine is given to patients with conditions that may cause a decrease in white blood cells, like those receiving certain types of chemotherapy or bone marrow transplant. It helps the bone marrow recover its ability to produce white blood cells. Increasing the amount of white blood cells helps to decrease the risk of infection and fever. This medicine may be used for other purposes; ask your health care provider or pharmacist if you have questions. What should I tell my health care provider before I take this medicine? They need to know if you have any of these conditions: -currently receiving radiation therapy -sickle cell disease -an unusual or allergic reaction to filgrastim, E. coli protein, other medicines, foods, dyes, or preservatives -pregnant or trying to get pregnant -breast-feeding How should I use this medicine? This medicine is for injection into a vein or injection under the skin. It is usually given by a health care professional in a hospital or clinic setting. If you get this medicine at home, you will be taught how to prepare and give this medicine. Always change the site for the injection under the skin. Let the solution warm to room temperature before you use it. Do not shake the solution before you withdraw a dose. Throw away any unused portion. Use exactly as directed. Take your medicine at regular intervals. Do not take your medicine more often than directed. It is important that you put your used needles and syringes in a special sharps container. Do not put them in a trash can. If you do not have a sharps container, call your pharmacist or healthcare provider to get one. Talk to your pediatrician regarding the use of this medicine in children. While this medicine may be prescribed for children for selected conditions, precautions do  apply. Overdosage: If you think you have taken too much of this medicine contact a poison control center or emergency room at once. NOTE: This medicine is only for you. Do not share this medicine with others. What if I miss a dose? Try not to miss doses. If you miss a dose take the dose as soon as you remember. If it is almost time for the next dose, do not take double doses unless told to by your doctor or health care professional. What may interact with this medicine? -lithium -medicines for cancer chemotherapy This list may not describe all possible interactions. Give your health care provider a list of all the medicines, herbs, non-prescription drugs, or dietary supplements you use. Also tell them if you smoke, drink alcohol, or use illegal drugs. Some items may interact with your medicine. What should I watch for while using this medicine? Visit your doctor or health care professional for regular checks on your progress. If you get a fever or any sign of infection while you are using this medicine, do not treat yourself. Check with your doctor or health care professional. Bone pain can usually be relieved by mild pain relievers such as acetaminophen or ibuprofen. Check with your doctor or health care professional before taking these medicines as they may hide a fever. Call your doctor or health care professional if the aches and pains are severe or do not go away. What side effects may I notice from receiving this medicine? Side effects that you should report to your doctor or health care professional as soon as possible: -allergic reactions like skin rash, itching   or hives, swelling of the face, lips, or tongue -difficulty breathing, wheezing -fever -pain, redness, or swelling at the injection site -stomach or side pain, or pain at the shoulder Side effects that usually do not require medical attention (report to your doctor or health care professional if they continue or are  bothersome): -bone pain (ribs, lower back, breast bone) -headache -skin rash This list may not describe all possible side effects. Call your doctor for medical advice about side effects. You may report side effects to FDA at 1-800-FDA-1088. Where should I keep my medicine? Keep out of the reach of children. Store in a refrigerator between 2 and 8 degrees C (36 and 46 degrees F). Do not freeze or leave in direct sunlight. If vials or syringes are left out of the refrigerator for more than 24 hours, they must be thrown away. Throw away unused vials after the expiration date on the carton. NOTE: This sheet is a summary. It may not cover all possible information. If you have questions about this medicine, talk to your doctor, pharmacist, or health care provider.  2013, [employer]. (06/24/2007 1:33:21 PM)  

## 2012-11-06 ENCOUNTER — Ambulatory Visit (HOSPITAL_BASED_OUTPATIENT_CLINIC_OR_DEPARTMENT_OTHER): Payer: Medicare Other

## 2012-11-06 ENCOUNTER — Other Ambulatory Visit: Payer: Self-pay | Admitting: Medical Oncology

## 2012-11-06 VITALS — BP 114/69 | HR 110 | Temp 98.7°F

## 2012-11-06 DIAGNOSIS — D709 Neutropenia, unspecified: Secondary | ICD-10-CM

## 2012-11-06 DIAGNOSIS — D701 Agranulocytosis secondary to cancer chemotherapy: Secondary | ICD-10-CM

## 2012-11-06 MED ORDER — FILGRASTIM 300 MCG/0.5ML IJ SOLN
300.0000 ug | Freq: Once | INTRAMUSCULAR | Status: AC
  Administered 2012-11-06: 300 ug via SUBCUTANEOUS
  Filled 2012-11-06: qty 0.5

## 2012-11-07 ENCOUNTER — Ambulatory Visit (HOSPITAL_BASED_OUTPATIENT_CLINIC_OR_DEPARTMENT_OTHER): Payer: Medicare Other

## 2012-11-07 VITALS — BP 121/71 | HR 100 | Temp 98.1°F | Resp 18

## 2012-11-07 DIAGNOSIS — D709 Neutropenia, unspecified: Secondary | ICD-10-CM

## 2012-11-07 MED ORDER — FILGRASTIM 300 MCG/0.5ML IJ SOLN
300.0000 ug | Freq: Once | INTRAMUSCULAR | Status: AC
  Administered 2012-11-07: 300 ug via SUBCUTANEOUS

## 2012-11-11 ENCOUNTER — Other Ambulatory Visit: Payer: Medicare Other | Admitting: Lab

## 2012-11-16 ENCOUNTER — Other Ambulatory Visit: Payer: Self-pay | Admitting: Infectious Diseases

## 2012-11-16 ENCOUNTER — Other Ambulatory Visit: Payer: Self-pay | Admitting: Licensed Clinical Social Worker

## 2012-11-16 DIAGNOSIS — G40909 Epilepsy, unspecified, not intractable, without status epilepticus: Secondary | ICD-10-CM

## 2012-11-16 MED ORDER — CARBAMAZEPINE 200 MG PO TABS: ORAL_TABLET | ORAL | Status: DC

## 2012-11-18 ENCOUNTER — Other Ambulatory Visit (HOSPITAL_BASED_OUTPATIENT_CLINIC_OR_DEPARTMENT_OTHER): Payer: Medicare Other | Admitting: Lab

## 2012-11-18 ENCOUNTER — Telehealth: Payer: Self-pay | Admitting: *Deleted

## 2012-11-18 ENCOUNTER — Ambulatory Visit: Payer: Medicare Other

## 2012-11-18 ENCOUNTER — Ambulatory Visit (HOSPITAL_BASED_OUTPATIENT_CLINIC_OR_DEPARTMENT_OTHER): Payer: Medicare Other

## 2012-11-18 ENCOUNTER — Ambulatory Visit (HOSPITAL_BASED_OUTPATIENT_CLINIC_OR_DEPARTMENT_OTHER): Payer: Medicare Other | Admitting: Physician Assistant

## 2012-11-18 ENCOUNTER — Telehealth: Payer: Self-pay | Admitting: Internal Medicine

## 2012-11-18 DIAGNOSIS — D701 Agranulocytosis secondary to cancer chemotherapy: Secondary | ICD-10-CM

## 2012-11-18 DIAGNOSIS — D702 Other drug-induced agranulocytosis: Secondary | ICD-10-CM

## 2012-11-18 DIAGNOSIS — C3410 Malignant neoplasm of upper lobe, unspecified bronchus or lung: Secondary | ICD-10-CM

## 2012-11-18 DIAGNOSIS — T451X5A Adverse effect of antineoplastic and immunosuppressive drugs, initial encounter: Secondary | ICD-10-CM

## 2012-11-18 DIAGNOSIS — C3491 Malignant neoplasm of unspecified part of right bronchus or lung: Secondary | ICD-10-CM

## 2012-11-18 DIAGNOSIS — C3490 Malignant neoplasm of unspecified part of unspecified bronchus or lung: Secondary | ICD-10-CM

## 2012-11-18 LAB — CBC WITH DIFFERENTIAL/PLATELET
Eosinophils Absolute: 0 10*3/uL (ref 0.0–0.5)
HCT: 36.5 % — ABNORMAL LOW (ref 38.4–49.9)
LYMPH%: 46.9 % (ref 14.0–49.0)
MCV: 102.8 fL — ABNORMAL HIGH (ref 79.3–98.0)
MONO#: 0.2 10*3/uL (ref 0.1–0.9)
NEUT#: 0.5 10*3/uL — CL (ref 1.5–6.5)
NEUT%: 36.7 % — ABNORMAL LOW (ref 39.0–75.0)
Platelets: 208 10*3/uL (ref 140–400)
WBC: 1.5 10*3/uL — ABNORMAL LOW (ref 4.0–10.3)

## 2012-11-18 MED ORDER — FILGRASTIM 300 MCG/0.5ML IJ SOLN
300.0000 ug | Freq: Once | INTRAMUSCULAR | Status: AC
  Administered 2012-11-18: 300 ug via SUBCUTANEOUS
  Filled 2012-11-18: qty 0.5

## 2012-11-18 NOTE — Telephone Encounter (Signed)
gv and printed appt sched and avs for pt...emailed MW to add tx... °

## 2012-11-18 NOTE — Progress Notes (Addendum)
[facility] [phone]   [phone]  SHARED VISIT PROGRESS NOTE  [name], MD [address]  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 Last dose was given on 08/24/2012 with no significant response.   CURRENT THERAPY:  Systemic chemotherapy with carboplatin for AUC of 5 on day 1 and gemcitabine 1000 mg/M2 on days 1 and 8 every 3 weeks. First dose given on 10/28/2012  INTERVAL HISTORY: Roger Walton 57 y.o. male returns to the clinic today for followup visit accompanied by his wife for an office visit prior to proceeding with cycle #2 of his systemic chemotherapy with carboplatin and gemcitabine. Well he was having his vital signs taken in preparation for his office visit he had a mild absence-type seizures witnessed by the med Tech, which you should,. She describes the event it as lasting less than a minute the patient began to "tune out" and home. He clutched the recording of the blood pressure machine and had no apparent sequelae. His wife states that these are E. usually the way of these events occur. He had no subsequent events while in our office. Patient states that he has been on Tegretol and phenobarbital for quite some time as his diagnosis of seizure disorder predates his cancer diagnosis. He is followed by Dr. [name] who monitors his Tegretol phenobarbital levels. He has an upcoming appointment on 12/23/2012 with Dr. Ninetta Lights. Patient states he usually he does well and is seizure-free except when he misses of some of his medications. He does admit to missing a few doses over the past week or so. He denies any fever or chills. He continues to smoke a few cigarettes a pack  of cigarettes lasting approximately 5 days. He voiced no specific complaints today. He did not have any specific recollection of the brief seizure.  He denied any diarrhea, or constipation. He denied having any significant weight loss or night sweats. He denied having any shortness breath, cough or hemoptysis. He has no nausea or vomiting. He denied having any significant weight loss or night sweats.  MEDICAL HISTORY: Past Medical History  Diagnosis Date  . 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 HIV infection   . SOB (shortness of breath)   . Chronic pain in right shoulder     scapula and arm for 2 months   . Anxiety     ALLERGIES:  has No Known Allergies.  MEDICATIONS:  Current Outpatient Prescriptions  Medication Sig Dispense Refill  . carbamazepine (TEGRETOL) 200 MG tablet take 3 tablets by mouth every morning 2 AT NOON and 3 tablets by mouth at bedtime  240 tablet  6  . ciprofloxacin (CIPRO) 500 MG tablet Take 1 tablet (500 mg total) by mouth 2 (two) times daily.  10 tablet  0  . efavirenz-emtricitabine-tenofovir (ATRIPLA) 600-200-300 MG per tablet Take 1 tablet by mouth at bedtime.      Marland Kitchen LORazepam (ATIVAN) 1 MG tablet Take 1 mg by mouth every 8 (eight) hours.      Marland Kitchen nystatin (MYCOSTATIN) 100000 UNIT/ML suspension 5 ml swish and spit four times a day  180 mL  0  . PHENobarbital (LUMINAL) 64.8 MG tablet Take 1 tablet (64.8 mg total) by mouth at bedtime.  30 tablet  5   No current facility-administered medications for this visit.    REVIEW OF SYSTEMS:  A comprehensive review of systems was negative except for: Neurological: positive for seizures   PHYSICAL EXAMINATION: General appearance: alert, cooperative and no distress Head: Normocephalic, without obvious abnormality, atraumatic Neck: no adenopathy Lymph nodes: Cervical, supraclavicular, and axillary nodes normal. Resp: clear to auscultation  bilaterally Cardio: regular rate and rhythm, S1, S2 normal, no murmur, click, rub or gallop GI: soft, non-tender; bowel sounds normal; no masses,  no organomegaly Extremities: extremities normal, atraumatic, no cyanosis or edema Neurologic: Alert and oriented X 3, normal strength and tone. Normal symmetric reflexes. Normal coordination and gait the patient's behavior and responses were totally appropriate post "seizure".  ECOG PERFORMANCE STATUS: 1 - Symptomatic but completely ambulatory  Blood pressure 114/74, pulse 77, temperature 97.9 F (36.6 C), temperature source Oral, resp. rate 18, height 5\' 9"  (1.753 m), weight 140 lb 9.6 oz (63.776 kg), SpO2 99.00%.  LABORATORY DATA: Lab Results  Component Value Date   WBC 1.5* 11/18/2012   HGB 12.6* 11/18/2012   HCT 36.5* 11/18/2012   MCV 102.8* 11/18/2012   PLT 208 11/18/2012      Chemistry      Component Value Date/Time   NA 142 10/28/2012 0804   NA 137 07/18/2012 1759   K 3.8 10/28/2012 0804   K 3.9 07/18/2012 1759   CL 103 08/24/2012 1129   CL 101 07/18/2012 1759   CO2 24 10/28/2012 0804   CO2 27 07/18/2012 1759   BUN 13.3 10/28/2012 0804   BUN 10 07/18/2012 1759   CREATININE 1.0 10/28/2012 0804   CREATININE 0.85 07/18/2012 1759   CREATININE 1.03 06/29/2012 0958      Component Value Date/Time   CALCIUM 9.0 10/28/2012 0804   CALCIUM 9.2 07/18/2012 1759   ALKPHOS 89 10/28/2012 0804   ALKPHOS 79 06/29/2012 0958   AST 22 10/28/2012 0804   AST 25 06/29/2012 0958   ALT 15 10/28/2012 0804   ALT 14 06/29/2012 0958   BILITOT 0.39 10/28/2012 0804   BILITOT 0.3 06/29/2012 0958       RADIOGRAPHIC STUDIES: Ct Chest W Contrast  09/28/2012   *RADIOLOGY REPORT*  Clinical Data: Lung cancer with chemotherapy and radiation therapy.  CT CHEST WITH CONTRAST  Technique:  Multidetector CT imaging of the chest was performed following the standard protocol during bolus administration of intravenous contrast.  Contrast: 80mL OMNIPAQUE IOHEXOL 300 MG/ML  SOLN  Comparison: PET  07/03/2012 and CT chest 06/24/2012.  Findings: Apical right upper lobe mass appears more prominent within the right supraclavicular region, with obscuration of fat planes and encasement of vessels (example image 4).  Within the apex of the right hemithorax, the soft tissue mass measures approximately 3.6 x 4.5 cm (image 6), previously 3.4 x 4.1 cm.  It appears contiguous with the upper esophagus (example image 10). Mediastinal lymph nodes measure up to 10 mm in the AP window, likely stable.  Right hilar lymphoid tissue measures 8 mm, stable. Axillary lymph nodes have fatty hila and are not considered enlarged.  Heart size normal.  No pericardial effusion.  Lungs are emphysematous.  A smudgy nodular density along the minor fissure (image 36) is unchanged.  Additional scattered nodular densities in the lungs measure up to 5 mm in the right lower lobe (image 51), unchanged.  A vague irregular opacity in the left upper lobe (image 21) is difficult to measure and subjectively, appears unchanged.  No pleural fluid.  Airway is unremarkable.  Incidental imaging of the upper abdomen shows no acute findings.  A 1.9 cm low attenuation lesion in the right adrenal glands is unchanged.  An intermediate density lesion in the spleen measures 2.8 cm, stable.  No worrisome lytic or sclerotic lesions.  IMPRESSION:  1.  Apical segment right upper lobe mass with infiltration into the right supraclavicular fossa.  Findings appears slightly progressive from 06/24/2012. 2.  Scattered nodular densities in the lungs, unchanged. 3.  Right adrenal adenoma. 4.  Intermediate density splenic lesion, better seen on today's study and unchanged in retrospect. Given the absence of hypermetabolism on 07/03/2012, a hemangioma is favored.   Original Report Authenticated By: Leanna Battles, M.D.    ASSESSMENT AND PLAN: This is a very pleasant 57 years old African American male with a right Pancoast tumor status post concurrent chemoradiation but  unfortunately has no significant regression of his disease. He is not a surgical candidate for resection at this point. He is currently being treated with systemic chemotherapy with carboplatin for AUC of 5 on day 1 and gemcitabine 1000 mg/M2 on days 1 and 8 every 3 weeks. Status post 1 cycle, although day 8 of cycle 1 was held due to neutropenia. The patient was discussed with and seen by Dr. Arbutus Ped. He is again neutropenic neutropenic today with a total white count of 1.5 and an ANC of 0.5. To address his chemotherapy induced neutropenia he will be given 3 days of Neupogen at 300 mcg subcutaneously. Patient is encouraged to take his antiseizure medications as prescribed and to followup with his primary care physician as scheduled or sooner should he have recurrent seizure activity. Both patient and his wife voiced understanding of these instructions. His chemotherapy will be rescheduled for next week with repeat CBC differential and C. met providing his counts are in the therapeutic range. He will otherwise followup with Dr. Arbutus Ped in 4 weeks prior to cycle #3 with repeat CBC differential and C. met. We will add a Neulasta injection after her day 8 of each subsequent cycle to hopefully avoid further episodes of neutropenia and that would postpone his chemotherapy.   Epifanio Labrador E, PA-C    He was advised to call immediately if he has any concerning symptoms in the interval. All questions were answered. The patient knows to call the clinic with any problems, questions or concerns. We can certainly see the patient much sooner if necessary.  I spent 15 minutes counseling the patient face to face. The total time spent in the appointment was 25 minutes.  ADDENDUM: Hematology/Oncology Attending: I have a face to face encounter with the patient today. He was accompanied by his wife. I recommended his care plan. He is currently undergoing systemic chemotherapy with carboplatin and gemcitabine for  progressive non-small cell lung cancer. He received only 1 dose of this treatment but the patient has significant neutropenia.   He was advised to call immediately if he has any concerning symptoms in the interval. Conni Slipper, PA-C  ADDENDUM: Hematology/Oncology Attending: I have a face to face encounter with the patient today. I recommended his care plan. The patient is feeling fine today with no specific complaint except for occasional absence seizure and he has one episode during this visit but his wife mentions that he has similar episodes every now and then. He is currently  on anti-seizure medication controlled by Dr. Ninetta Lights. He was here today to start the second cycle of his systemic chemotherapy with carboplatin and gemcitabine but unfortunately his absolute neutrophil count is still low. I will arrange for the patient to have Neupogen 300 mcg subcutaneously for the next 3 days. I will reschedule his chemotherapy to be given next week if his absolute neutrophil count recovers. Lajuana Matte., MD 11/18/2012

## 2012-11-18 NOTE — Telephone Encounter (Signed)
Per staff message and POF I have scheduled appts.  JMW  

## 2012-11-18 NOTE — Patient Instructions (Addendum)
He will receive 3 days of Neupogen for your low white blood cell count. Your chemotherapy has been rescheduled to next week and we will recheck your labs to be sure that there and therapeutic range for you to proceed with chemotherapy. Followup in 4 weeks prior to cycle #3 of your systemic chemotherapy.

## 2012-11-19 ENCOUNTER — Ambulatory Visit (HOSPITAL_BASED_OUTPATIENT_CLINIC_OR_DEPARTMENT_OTHER): Payer: Medicare Other

## 2012-11-19 ENCOUNTER — Other Ambulatory Visit: Payer: Self-pay | Admitting: *Deleted

## 2012-11-19 VITALS — BP 121/67 | HR 93 | Temp 98.4°F

## 2012-11-19 DIAGNOSIS — D702 Other drug-induced agranulocytosis: Secondary | ICD-10-CM

## 2012-11-19 DIAGNOSIS — T451X5A Adverse effect of antineoplastic and immunosuppressive drugs, initial encounter: Secondary | ICD-10-CM

## 2012-11-19 DIAGNOSIS — B20 Human immunodeficiency virus [HIV] disease: Secondary | ICD-10-CM

## 2012-11-19 DIAGNOSIS — R569 Unspecified convulsions: Secondary | ICD-10-CM

## 2012-11-19 MED ORDER — PHENOBARBITAL 64.8 MG PO TABS: 64.8000 mg | ORAL_TABLET | Freq: Every day | ORAL | Status: DC

## 2012-11-19 MED ORDER — EFAVIRENZ-EMTRICITAB-TENOFOVIR 600-200-300 MG PO TABS: 1.0000 | ORAL_TABLET | Freq: Every day | ORAL | Status: DC

## 2012-11-19 MED ORDER — FILGRASTIM 300 MCG/0.5ML IJ SOLN
300.0000 ug | Freq: Once | INTRAMUSCULAR | Status: AC
  Administered 2012-11-19: 300 ug via SUBCUTANEOUS
  Filled 2012-11-19: qty 0.5

## 2012-11-20 ENCOUNTER — Ambulatory Visit (HOSPITAL_BASED_OUTPATIENT_CLINIC_OR_DEPARTMENT_OTHER): Payer: Medicare Other

## 2012-11-20 VITALS — BP 109/74 | HR 85 | Temp 98.0°F | Resp 20

## 2012-11-20 DIAGNOSIS — D701 Agranulocytosis secondary to cancer chemotherapy: Secondary | ICD-10-CM

## 2012-11-20 DIAGNOSIS — D702 Other drug-induced agranulocytosis: Secondary | ICD-10-CM

## 2012-11-20 DIAGNOSIS — T451X5A Adverse effect of antineoplastic and immunosuppressive drugs, initial encounter: Secondary | ICD-10-CM

## 2012-11-20 MED ORDER — FILGRASTIM 300 MCG/0.5ML IJ SOLN
300.0000 ug | Freq: Once | INTRAMUSCULAR | Status: AC
  Administered 2012-11-20: 300 ug via SUBCUTANEOUS
  Filled 2012-11-20: qty 0.5

## 2012-11-20 NOTE — Patient Instructions (Addendum)
Filgrastim, G-CSF injection What is this medicine? FILGRASTIM, G-CSF (fil GRA stim) stimulates the formation of white blood cells. This medicine is given to patients with conditions that may cause a decrease in white blood cells, like those receiving certain types of chemotherapy or bone marrow transplant. It helps the bone marrow recover its ability to produce white blood cells. Increasing the amount of white blood cells helps to decrease the risk of infection and fever. This medicine may be used for other purposes; ask your health care provider or pharmacist if you have questions. What should I tell my health care provider before I take this medicine? They need to know if you have any of these conditions: -currently receiving radiation therapy -sickle cell disease -an unusual or allergic reaction to filgrastim, E. coli protein, other medicines, foods, dyes, or preservatives -pregnant or trying to get pregnant -breast-feeding How should I use this medicine? This medicine is for injection into a vein or injection under the skin. It is usually given by a health care professional in a hospital or clinic setting. If you get this medicine at home, you will be taught how to prepare and give this medicine. Always change the site for the injection under the skin. Let the solution warm to room temperature before you use it. Do not shake the solution before you withdraw a dose. Throw away any unused portion. Use exactly as directed. Take your medicine at regular intervals. Do not take your medicine more often than directed. It is important that you put your used needles and syringes in a special sharps container. Do not put them in a trash can. If you do not have a sharps container, call your pharmacist or healthcare provider to get one. Talk to your pediatrician regarding the use of this medicine in children. While this medicine may be prescribed for children for selected conditions, precautions do  apply. Overdosage: If you think you have taken too much of this medicine contact a poison control center or emergency room at once. NOTE: This medicine is only for you. Do not share this medicine with others. What if I miss a dose? Try not to miss doses. If you miss a dose take the dose as soon as you remember. If it is almost time for the next dose, do not take double doses unless told to by your doctor or health care professional. What may interact with this medicine? -lithium -medicines for cancer chemotherapy This list may not describe all possible interactions. Give your health care provider a list of all the medicines, herbs, non-prescription drugs, or dietary supplements you use. Also tell them if you smoke, drink alcohol, or use illegal drugs. Some items may interact with your medicine. What should I watch for while using this medicine? Visit your doctor or health care professional for regular checks on your progress. If you get a fever or any sign of infection while you are using this medicine, do not treat yourself. Check with your doctor or health care professional. Bone pain can usually be relieved by mild pain relievers such as acetaminophen or ibuprofen. Check with your doctor or health care professional before taking these medicines as they may hide a fever. Call your doctor or health care professional if the aches and pains are severe or do not go away. What side effects may I notice from receiving this medicine? Side effects that you should report to your doctor or health care professional as soon as possible: -allergic reactions like skin rash, itching   or hives, swelling of the face, lips, or tongue -difficulty breathing, wheezing -fever -pain, redness, or swelling at the injection site -stomach or side pain, or pain at the shoulder Side effects that usually do not require medical attention (report to your doctor or health care professional if they continue or are  bothersome): -bone pain (ribs, lower back, breast bone) -headache -skin rash This list may not describe all possible side effects. Call your doctor for medical advice about side effects. You may report side effects to FDA at 1-800-FDA-1088. Where should I keep my medicine? Keep out of the reach of children. Store in a refrigerator between 2 and 8 degrees C (36 and 46 degrees F). Do not freeze or leave in direct sunlight. If vials or syringes are left out of the refrigerator for more than 24 hours, they must be thrown away. Throw away unused vials after the expiration date on the carton. NOTE: This sheet is a summary. It may not cover all possible information. If you have questions about this medicine, talk to your doctor, pharmacist, or health care provider.  2013, [employer]. (06/24/2007 1:33:21 PM)  

## 2012-11-25 ENCOUNTER — Other Ambulatory Visit: Payer: Medicare Other | Admitting: Lab

## 2012-11-25 ENCOUNTER — Ambulatory Visit: Payer: Medicare Other

## 2012-11-26 ENCOUNTER — Telehealth: Payer: Self-pay | Admitting: Internal Medicine

## 2012-11-26 NOTE — Telephone Encounter (Signed)
s.w. pt and advised on 8.13.14 appts and emialed MB to add tx.

## 2012-12-02 ENCOUNTER — Ambulatory Visit (HOSPITAL_BASED_OUTPATIENT_CLINIC_OR_DEPARTMENT_OTHER): Payer: Medicare Other | Admitting: Physician Assistant

## 2012-12-02 ENCOUNTER — Other Ambulatory Visit (HOSPITAL_BASED_OUTPATIENT_CLINIC_OR_DEPARTMENT_OTHER): Payer: Medicare Other | Admitting: Lab

## 2012-12-02 ENCOUNTER — Telehealth: Payer: Self-pay | Admitting: Internal Medicine

## 2012-12-02 ENCOUNTER — Other Ambulatory Visit: Payer: Medicare Other

## 2012-12-02 ENCOUNTER — Ambulatory Visit (HOSPITAL_BASED_OUTPATIENT_CLINIC_OR_DEPARTMENT_OTHER): Payer: Medicare Other

## 2012-12-02 DIAGNOSIS — T451X5A Adverse effect of antineoplastic and immunosuppressive drugs, initial encounter: Secondary | ICD-10-CM

## 2012-12-02 DIAGNOSIS — C3410 Malignant neoplasm of upper lobe, unspecified bronchus or lung: Secondary | ICD-10-CM

## 2012-12-02 DIAGNOSIS — C3491 Malignant neoplasm of unspecified part of right bronchus or lung: Secondary | ICD-10-CM

## 2012-12-02 DIAGNOSIS — D701 Agranulocytosis secondary to cancer chemotherapy: Secondary | ICD-10-CM

## 2012-12-02 DIAGNOSIS — D702 Other drug-induced agranulocytosis: Secondary | ICD-10-CM

## 2012-12-02 LAB — CBC WITH DIFFERENTIAL/PLATELET
Basophils Absolute: 0 10*3/uL (ref 0.0–0.1)
EOS%: 1.1 % (ref 0.0–7.0)
HCT: 34.9 % — ABNORMAL LOW (ref 38.4–49.9)
HGB: 12 g/dL — ABNORMAL LOW (ref 13.0–17.1)
LYMPH%: 25.7 % (ref 14.0–49.0)
MCH: 36.9 pg — ABNORMAL HIGH (ref 27.2–33.4)
MCV: 107.6 fL — ABNORMAL HIGH (ref 79.3–98.0)
MONO%: 16.9 % — ABNORMAL HIGH (ref 0.0–14.0)
NEUT%: 54.3 % (ref 39.0–75.0)
Platelets: 262 10*3/uL (ref 140–400)

## 2012-12-02 LAB — COMPREHENSIVE METABOLIC PANEL (CC13)
AST: 25 U/L (ref 5–34)
Alkaline Phosphatase: 97 U/L (ref 40–150)
BUN: 16.2 mg/dL (ref 7.0–26.0)
Calcium: 9 mg/dL (ref 8.4–10.4)
Chloride: 106 mEq/L (ref 98–109)
Creatinine: 1 mg/dL (ref 0.7–1.3)

## 2012-12-02 MED ORDER — FILGRASTIM 300 MCG/0.5ML IJ SOLN
300.0000 ug | Freq: Once | INTRAMUSCULAR | Status: AC
  Administered 2012-12-02: 300 ug via SUBCUTANEOUS
  Filled 2012-12-02: qty 0.5

## 2012-12-02 NOTE — Telephone Encounter (Signed)
Gave pt appt for injection for 8/14 to 8/16 this week

## 2012-12-02 NOTE — Progress Notes (Signed)
Chemo held, Neupogen only today.

## 2012-12-03 ENCOUNTER — Ambulatory Visit (HOSPITAL_BASED_OUTPATIENT_CLINIC_OR_DEPARTMENT_OTHER): Payer: Medicare Other

## 2012-12-03 VITALS — BP 131/109 | HR 109 | Temp 99.4°F

## 2012-12-03 DIAGNOSIS — D702 Other drug-induced agranulocytosis: Secondary | ICD-10-CM

## 2012-12-03 DIAGNOSIS — D701 Agranulocytosis secondary to cancer chemotherapy: Secondary | ICD-10-CM

## 2012-12-03 DIAGNOSIS — T451X5A Adverse effect of antineoplastic and immunosuppressive drugs, initial encounter: Secondary | ICD-10-CM

## 2012-12-03 MED ORDER — FILGRASTIM 300 MCG/0.5ML IJ SOLN
300.0000 ug | Freq: Once | INTRAMUSCULAR | Status: AC
  Administered 2012-12-03: 300 ug via SUBCUTANEOUS
  Filled 2012-12-03: qty 0.5

## 2012-12-03 NOTE — Progress Notes (Addendum)
[facility] [phone]   [phone]) [phone]  SHARED VISIT PROGRESS NOTE  [name], MD [address]  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 Last dose was given on 08/24/2012 with no significant response.   CURRENT THERAPY:  Systemic chemotherapy with carboplatin for AUC of 5 on day 1 and gemcitabine 1000 mg/M2 on days 1 and 8 every 3 weeks. First dose given on 10/28/2012  INTERVAL HISTORY: GENNARO LIZOTTE 57 y.o. male returns to the clinic today for followup visit accompanied by his wife for an office visit prior to proceeding with cycle #2 of his systemic chemotherapy with carboplatin and gemcitabine. He reports he is been more compliant with his antiseizure medications and has had no further seizure activity. His wife concurs with this. His chemotherapy has been held due to persistent neutropenia. He presents today with repeat labs to see if he can proceed with his second cycle of systemic chemotherapy with carboplatin and gemcitabine. He voiced no specific complaints today.  He denied any diarrhea, or constipation. He denied having any significant weight loss or night sweats. He denied having any shortness breath, cough or hemoptysis. He has no nausea or vomiting. He denied having any significant weight loss or night sweats.  MEDICAL HISTORY: Past Medical History  Diagnosis Date  . 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 HIV infection   . SOB (shortness of breath)   . Chronic pain in right shoulder     scapula and arm for 2 months   . Anxiety     ALLERGIES:  has No Known Allergies.  MEDICATIONS:  Current  Outpatient Prescriptions  Medication Sig Dispense Refill  . carbamazepine (TEGRETOL) 200 MG tablet take 3 tablets by mouth every morning 2 AT NOON and 3 tablets by mouth at bedtime  240 tablet  6  . ciprofloxacin (CIPRO) 500 MG tablet Take 1 tablet (500 mg total) by mouth 2 (two) times daily.  10 tablet  0  . efavirenz-emtricitabine-tenofovir (ATRIPLA) 600-200-300 MG per tablet Take 1 tablet by mouth at bedtime.  90 tablet  3  . LORazepam (ATIVAN) 1 MG tablet Take 1 mg by mouth every 8 (eight) hours.      Marland Kitchen nystatin (MYCOSTATIN) 100000 UNIT/ML suspension 5 ml swish and spit four times a day  180 mL  0  . PHENobarbital (LUMINAL) 64.8 MG tablet Take 1 tablet (64.8 mg total) by mouth at bedtime.  90 tablet  2   No current facility-administered medications for this visit.    REVIEW OF SYSTEMS:  A comprehensive review of systems was negative.   PHYSICAL EXAMINATION: General appearance: alert, cooperative and no distress Head: Normocephalic, without obvious abnormality, atraumatic Neck: no adenopathy Lymph nodes: Cervical, supraclavicular, and axillary nodes normal. Resp: clear to auscultation bilaterally Cardio: regular rate and rhythm, S1, S2 normal, no murmur, click, rub or gallop GI: soft, non-tender; bowel sounds normal; no masses,  no organomegaly Extremities: extremities normal, atraumatic, no cyanosis or edema Neurologic: Alert and oriented X 3, normal strength and tone. Normal symmetric reflexes. Normal coordination and gait   ECOG PERFORMANCE STATUS: 1 - Symptomatic  but completely ambulatory  Blood pressure 111/69, pulse 92, temperature 98.5 F (36.9 C), temperature source Oral, resp. rate 18, height 5\' 9"  (1.753 m), weight 145 lb 11.2 oz (66.089 kg).  LABORATORY DATA: Lab Results  Component Value Date   WBC 1.7* 12/02/2012   HGB 12.0* 12/02/2012   HCT 34.9* 12/02/2012   MCV 107.6* 12/02/2012   PLT 262 12/02/2012      Chemistry      Component Value Date/Time   NA 140  12/02/2012 1447   NA 137 07/18/2012 1759   K 4.2 12/02/2012 1447   K 3.9 07/18/2012 1759   CL 103 08/24/2012 1129   CL 101 07/18/2012 1759   CO2 26 12/02/2012 1447   CO2 27 07/18/2012 1759   BUN 16.2 12/02/2012 1447   BUN 10 07/18/2012 1759   CREATININE 1.0 12/02/2012 1447   CREATININE 0.85 07/18/2012 1759   CREATININE 1.03 06/29/2012 0958      Component Value Date/Time   CALCIUM 9.0 12/02/2012 1447   CALCIUM 9.2 07/18/2012 1759   ALKPHOS 97 12/02/2012 1447   ALKPHOS 79 06/29/2012 0958   AST 25 12/02/2012 1447   AST 25 06/29/2012 0958   ALT 14 12/02/2012 1447   ALT 14 06/29/2012 0958   BILITOT 0.25 12/02/2012 1447   BILITOT 0.3 06/29/2012 0958       RADIOGRAPHIC STUDIES: Ct Chest W Contrast  09/28/2012   *RADIOLOGY REPORT*  Clinical Data: Lung cancer with chemotherapy and radiation therapy.  CT CHEST WITH CONTRAST  Technique:  Multidetector CT imaging of the chest was performed following the standard protocol during bolus administration of intravenous contrast.  Contrast: 80mL OMNIPAQUE IOHEXOL 300 MG/ML  SOLN  Comparison: PET 07/03/2012 and CT chest 06/24/2012.  Findings: Apical right upper lobe mass appears more prominent within the right supraclavicular region, with obscuration of fat planes and encasement of vessels (example image 4).  Within the apex of the right hemithorax, the soft tissue mass measures approximately 3.6 x 4.5 cm (image 6), previously 3.4 x 4.1 cm.  It appears contiguous with the upper esophagus (example image 10). Mediastinal lymph nodes measure up to 10 mm in the AP window, likely stable.  Right hilar lymphoid tissue measures 8 mm, stable. Axillary lymph nodes have fatty hila and are not considered enlarged.  Heart size normal.  No pericardial effusion.  Lungs are emphysematous.  A smudgy nodular density along the minor fissure (image 36) is unchanged.  Additional scattered nodular densities in the lungs measure up to 5 mm in the right lower lobe (image 51), unchanged.  A vague  irregular opacity in the left upper lobe (image 21) is difficult to measure and subjectively, appears unchanged.  No pleural fluid.  Airway is unremarkable.  Incidental imaging of the upper abdomen shows no acute findings.  A 1.9 cm low attenuation lesion in the right adrenal glands is unchanged.  An intermediate density lesion in the spleen measures 2.8 cm, stable.  No worrisome lytic or sclerotic lesions.  IMPRESSION:  1.  Apical segment right upper lobe mass with infiltration into the right supraclavicular fossa.  Findings appears slightly progressive from 06/24/2012. 2.  Scattered nodular densities in the lungs, unchanged. 3.  Right adrenal adenoma. 4.  Intermediate density splenic lesion, better seen on today's study and unchanged in retrospect. Given the absence of hypermetabolism on 07/03/2012, a hemangioma is favored.   Original Report Authenticated By: Leanna Battles, M.D.    ASSESSMENT AND PLAN: This is a very pleasant 57  years old [place] American male with a right Pancoast tumor status post concurrent chemoradiation but unfortunately has no significant regression of his disease. He is not a surgical candidate for resection at this point. He is currently being treated with systemic chemotherapy with carboplatin for AUC of 5 on day 1 and gemcitabine 1000 mg/M2 on days 1 and 8 every 3 weeks. Status post 1 cycle, although day 8 of cycle 1 was held due to neutropenia. The patient was discussed with and seen by Dr. Arbutus Ped. He is again neutropenic neutropenic today with a total white count of 1.7 and an ANC of 0.9. To address his chemotherapy induced neutropenia he will be given 4 days of Neupogen at 300 mcg subcutaneously.  We will add a Neulasta injection after her day 8 of each subsequent cycle to hopefully avoid further episodes of neutropenia and that would postpone his chemotherapy. His persistent neutropenia is concerning and may be multifactorial, due to both chemotherapy induced implications as  well as the fracture of his HIV status.  Tanecia Mccay E, PA-C    He was advised to call immediately if he has any concerning symptoms in the interval. All questions were answered. The patient knows to call the clinic with any problems, questions or concerns. We can certainly see the patient much sooner if necessary.  ADDENDUM: Hematology/Oncology Attending: I have a face to face encounter with the patient today. I recommended his care plan. Is a very pleasant 57 years old [place] American male with stage IIb/IIIa non-small cell lung cancer with a right Pancoast tumor status post concurrent chemoradiation with partial response and currently on systemic chemotherapy with carboplatin and gemcitabine status post 1 cycle. Unfortunately the patient continues to have significant neutropenia and were not able to resume her systemic chemotherapy for the last few weeks. I recommended for him to consider treatment with Neupogen 300 mcg subcutaneously for the next 4 days. If his absolute neutrophil count is over 1500 by next week, we will proceed with his systemic chemotherapy and consider him for Neulasta on day 9 of his chemotherapy cycle. He would come back for follow up visit in 2 weeks. Lajuana Matte., MD 12/02/2012

## 2012-12-04 ENCOUNTER — Ambulatory Visit (HOSPITAL_BASED_OUTPATIENT_CLINIC_OR_DEPARTMENT_OTHER): Payer: Medicare Other

## 2012-12-04 VITALS — BP 111/69 | HR 91 | Temp 98.5°F

## 2012-12-04 DIAGNOSIS — D702 Other drug-induced agranulocytosis: Secondary | ICD-10-CM

## 2012-12-04 DIAGNOSIS — T451X5A Adverse effect of antineoplastic and immunosuppressive drugs, initial encounter: Secondary | ICD-10-CM

## 2012-12-04 MED ORDER — FILGRASTIM 300 MCG/0.5ML IJ SOLN
300.0000 ug | Freq: Once | INTRAMUSCULAR | Status: AC
  Administered 2012-12-04: 300 ug via SUBCUTANEOUS
  Filled 2012-12-04: qty 0.5

## 2012-12-04 NOTE — Patient Instructions (Addendum)
You are being scheduled for Neupogen injections for the next 4 days Follow up with Dr. Arbutus Ped in 2 weeks prior to your next scheduled cycle of chemotherapy

## 2012-12-05 ENCOUNTER — Ambulatory Visit (HOSPITAL_BASED_OUTPATIENT_CLINIC_OR_DEPARTMENT_OTHER): Payer: Medicare Other

## 2012-12-05 VITALS — BP 112/70 | HR 89 | Temp 98.3°F

## 2012-12-05 DIAGNOSIS — D701 Agranulocytosis secondary to cancer chemotherapy: Secondary | ICD-10-CM

## 2012-12-05 DIAGNOSIS — T451X5A Adverse effect of antineoplastic and immunosuppressive drugs, initial encounter: Secondary | ICD-10-CM

## 2012-12-05 DIAGNOSIS — D702 Other drug-induced agranulocytosis: Secondary | ICD-10-CM

## 2012-12-05 MED ORDER — FILGRASTIM 300 MCG/0.5ML IJ SOLN
300.0000 ug | Freq: Once | INTRAMUSCULAR | Status: AC
  Administered 2012-12-05: 300 ug via SUBCUTANEOUS

## 2012-12-09 ENCOUNTER — Ambulatory Visit (HOSPITAL_BASED_OUTPATIENT_CLINIC_OR_DEPARTMENT_OTHER): Payer: Medicare Other

## 2012-12-09 ENCOUNTER — Other Ambulatory Visit: Payer: Medicare Other

## 2012-12-09 ENCOUNTER — Ambulatory Visit (HOSPITAL_BASED_OUTPATIENT_CLINIC_OR_DEPARTMENT_OTHER): Payer: Medicare Other | Admitting: Lab

## 2012-12-09 ENCOUNTER — Other Ambulatory Visit: Payer: Self-pay | Admitting: Internal Medicine

## 2012-12-09 DIAGNOSIS — C3410 Malignant neoplasm of upper lobe, unspecified bronchus or lung: Secondary | ICD-10-CM

## 2012-12-09 DIAGNOSIS — B20 Human immunodeficiency virus [HIV] disease: Secondary | ICD-10-CM

## 2012-12-09 DIAGNOSIS — C3491 Malignant neoplasm of unspecified part of right bronchus or lung: Secondary | ICD-10-CM

## 2012-12-09 DIAGNOSIS — Z5111 Encounter for antineoplastic chemotherapy: Secondary | ICD-10-CM

## 2012-12-09 LAB — CBC WITH DIFFERENTIAL/PLATELET
BASO%: 0.2 % (ref 0.0–2.0)
HCT: 36.2 % — ABNORMAL LOW (ref 38.4–49.9)
MCHC: 34.5 g/dL (ref 32.0–36.0)
MONO#: 0.8 10*3/uL (ref 0.1–0.9)
RBC: 3.46 10*6/uL — ABNORMAL LOW (ref 4.20–5.82)
RDW: 14.8 % — ABNORMAL HIGH (ref 11.0–14.6)
WBC: 4.3 10*3/uL (ref 4.0–10.3)
lymph#: 0.6 10*3/uL — ABNORMAL LOW (ref 0.9–3.3)
nRBC: 0 % (ref 0–0)

## 2012-12-09 LAB — COMPREHENSIVE METABOLIC PANEL (CC13)
ALT: 14 U/L (ref 0–55)
AST: 24 U/L (ref 5–34)
Albumin: 3.3 g/dL — ABNORMAL LOW (ref 3.5–5.0)
Alkaline Phosphatase: 105 U/L (ref 40–150)
BUN: 19 mg/dL (ref 7.0–26.0)
Potassium: 4.2 mEq/L (ref 3.5–5.1)
Sodium: 138 mEq/L (ref 136–145)

## 2012-12-09 LAB — CBC
MCV: 105 fL — ABNORMAL HIGH (ref 78.0–100.0)
Platelets: 305 10*3/uL (ref 150–400)
RBC: 3.58 MIL/uL — ABNORMAL LOW (ref 4.22–5.81)
WBC: 5.1 10*3/uL (ref 4.0–10.5)

## 2012-12-09 LAB — COMPREHENSIVE METABOLIC PANEL
ALT: 13 U/L (ref 0–53)
Albumin: 4.1 g/dL (ref 3.5–5.2)
CO2: 24 mEq/L (ref 19–32)
Calcium: 9.2 mg/dL (ref 8.4–10.5)
Chloride: 110 mEq/L (ref 96–112)
Creat: 1.24 mg/dL (ref 0.50–1.35)
Total Protein: 7.5 g/dL (ref 6.0–8.3)

## 2012-12-09 MED ORDER — SODIUM CHLORIDE 0.9 % IV SOLN
800.0000 mg/m2 | Freq: Once | INTRAVENOUS | Status: AC
  Administered 2012-12-09: 1406 mg via INTRAVENOUS
  Filled 2012-12-09: qty 36.98

## 2012-12-09 MED ORDER — ONDANSETRON 16 MG/50ML IVPB (CHCC)
16.0000 mg | Freq: Once | INTRAVENOUS | Status: AC
  Administered 2012-12-09: 16 mg via INTRAVENOUS

## 2012-12-09 MED ORDER — SODIUM CHLORIDE 0.9 % IV SOLN
390.0000 mg | Freq: Once | INTRAVENOUS | Status: AC
  Administered 2012-12-09: 390 mg via INTRAVENOUS
  Filled 2012-12-09: qty 39

## 2012-12-09 MED ORDER — SODIUM CHLORIDE 0.9 % IV SOLN
Freq: Once | INTRAVENOUS | Status: AC
  Administered 2012-12-09: 14:00:00 via INTRAVENOUS

## 2012-12-09 MED ORDER — DEXAMETHASONE SODIUM PHOSPHATE 20 MG/5ML IJ SOLN
20.0000 mg | Freq: Once | INTRAMUSCULAR | Status: AC
  Administered 2012-12-09: 20 mg via INTRAVENOUS

## 2012-12-09 NOTE — Patient Instructions (Addendum)
[facility] Discharge Instructions for Patients Receiving Chemotherapy  Today you received the following chemotherapy agents :  Gemcitabine,  Carboplatin.  To help prevent nausea and vomiting after your treatment, we encourage you to take your nausea medication as instructed by your physician.   If you develop nausea and vomiting that is not controlled by your nausea medication, call the clinic.   BELOW ARE SYMPTOMS THAT SHOULD BE REPORTED IMMEDIATELY:  *FEVER GREATER THAN 100.5 F  *CHILLS WITH OR WITHOUT FEVER  NAUSEA AND VOMITING THAT IS NOT CONTROLLED WITH YOUR NAUSEA MEDICATION  *UNUSUAL SHORTNESS OF BREATH  *UNUSUAL BRUISING OR BLEEDING  TENDERNESS IN MOUTH AND THROAT WITH OR WITHOUT PRESENCE OF ULCERS  *URINARY PROBLEMS  *BOWEL PROBLEMS  UNUSUAL RASH Items with * indicate a potential emergency and should be followed up as soon as possible.  Feel free to call the clinic you have any questions or concerns. The clinic phone number is [phone].    

## 2012-12-10 ENCOUNTER — Telehealth: Payer: Self-pay | Admitting: *Deleted

## 2012-12-10 NOTE — Telephone Encounter (Signed)
Per staff message and POF I have scheduled appts.  JMW  

## 2012-12-11 ENCOUNTER — Other Ambulatory Visit: Payer: Medicare Other

## 2012-12-16 ENCOUNTER — Ambulatory Visit: Payer: Medicare Other

## 2012-12-16 ENCOUNTER — Telehealth: Payer: Self-pay | Admitting: Internal Medicine

## 2012-12-16 ENCOUNTER — Ambulatory Visit: Payer: Medicare Other | Admitting: Internal Medicine

## 2012-12-16 ENCOUNTER — Ambulatory Visit (HOSPITAL_BASED_OUTPATIENT_CLINIC_OR_DEPARTMENT_OTHER): Payer: Medicare Other

## 2012-12-16 ENCOUNTER — Other Ambulatory Visit (HOSPITAL_BASED_OUTPATIENT_CLINIC_OR_DEPARTMENT_OTHER): Payer: Medicare Other | Admitting: Lab

## 2012-12-16 ENCOUNTER — Ambulatory Visit (HOSPITAL_BASED_OUTPATIENT_CLINIC_OR_DEPARTMENT_OTHER): Payer: Medicare Other | Admitting: Physician Assistant

## 2012-12-16 DIAGNOSIS — T451X5A Adverse effect of antineoplastic and immunosuppressive drugs, initial encounter: Secondary | ICD-10-CM

## 2012-12-16 DIAGNOSIS — C3410 Malignant neoplasm of upper lobe, unspecified bronchus or lung: Secondary | ICD-10-CM

## 2012-12-16 DIAGNOSIS — D701 Agranulocytosis secondary to cancer chemotherapy: Secondary | ICD-10-CM

## 2012-12-16 DIAGNOSIS — D702 Other drug-induced agranulocytosis: Secondary | ICD-10-CM

## 2012-12-16 DIAGNOSIS — C3491 Malignant neoplasm of unspecified part of right bronchus or lung: Secondary | ICD-10-CM

## 2012-12-16 DIAGNOSIS — B20 Human immunodeficiency virus [HIV] disease: Secondary | ICD-10-CM

## 2012-12-16 LAB — CBC WITH DIFFERENTIAL/PLATELET
BASO%: 2.2 % — ABNORMAL HIGH (ref 0.0–2.0)
Eosinophils Absolute: 0 10*3/uL (ref 0.0–0.5)
HCT: 36.4 % — ABNORMAL LOW (ref 38.4–49.9)
LYMPH%: 53.9 % — ABNORMAL HIGH (ref 14.0–49.0)
MCHC: 35.7 g/dL (ref 32.0–36.0)
MONO#: 0.2 10*3/uL (ref 0.1–0.9)
NEUT%: 24.8 % — ABNORMAL LOW (ref 39.0–75.0)
Platelets: 144 10*3/uL (ref 140–400)
WBC: 0.9 10*3/uL — CL (ref 4.0–10.3)

## 2012-12-16 MED ORDER — FILGRASTIM 300 MCG/0.5ML IJ SOLN
300.0000 ug | Freq: Once | INTRAMUSCULAR | Status: AC
  Administered 2012-12-16: 300 ug via SUBCUTANEOUS
  Filled 2012-12-16: qty 0.5

## 2012-12-16 NOTE — Telephone Encounter (Signed)
Called pt left message regarding appt for injection everyday until saturday

## 2012-12-16 NOTE — Telephone Encounter (Signed)
gv and printed appt sched and avs for pt...emailed MW to add tx... °

## 2012-12-17 ENCOUNTER — Telehealth: Payer: Self-pay | Admitting: *Deleted

## 2012-12-17 ENCOUNTER — Ambulatory Visit: Payer: Medicare Other

## 2012-12-17 NOTE — Telephone Encounter (Signed)
Per staff message and POF I have scheduled appts.  JMW  

## 2012-12-18 ENCOUNTER — Other Ambulatory Visit: Payer: Self-pay | Admitting: Internal Medicine

## 2012-12-18 ENCOUNTER — Ambulatory Visit (HOSPITAL_BASED_OUTPATIENT_CLINIC_OR_DEPARTMENT_OTHER): Payer: Medicare Other

## 2012-12-18 VITALS — BP 115/66 | HR 93 | Temp 99.0°F

## 2012-12-18 DIAGNOSIS — T451X5A Adverse effect of antineoplastic and immunosuppressive drugs, initial encounter: Secondary | ICD-10-CM

## 2012-12-18 DIAGNOSIS — D701 Agranulocytosis secondary to cancer chemotherapy: Secondary | ICD-10-CM

## 2012-12-18 DIAGNOSIS — D702 Other drug-induced agranulocytosis: Secondary | ICD-10-CM

## 2012-12-18 MED ORDER — FILGRASTIM 300 MCG/0.5ML IJ SOLN
300.0000 ug | Freq: Once | INTRAMUSCULAR | Status: AC
  Administered 2012-12-18: 300 ug via SUBCUTANEOUS
  Filled 2012-12-18: qty 0.5

## 2012-12-19 ENCOUNTER — Ambulatory Visit (HOSPITAL_BASED_OUTPATIENT_CLINIC_OR_DEPARTMENT_OTHER): Payer: Medicare Other

## 2012-12-19 VITALS — BP 115/71 | HR 92 | Temp 98.5°F

## 2012-12-19 DIAGNOSIS — D701 Agranulocytosis secondary to cancer chemotherapy: Secondary | ICD-10-CM

## 2012-12-19 DIAGNOSIS — D702 Other drug-induced agranulocytosis: Secondary | ICD-10-CM

## 2012-12-19 MED ORDER — FILGRASTIM 300 MCG/0.5ML IJ SOLN
300.0000 ug | Freq: Once | INTRAMUSCULAR | Status: AC
  Administered 2012-12-19: 300 ug via SUBCUTANEOUS

## 2012-12-21 NOTE — Patient Instructions (Addendum)
Continue to follow neutropenic precautions as previously discussed Take Cipro 500 mg by mouth twice daily for the next 5 days Keep your appointments for your Neupogen injections over the next 3 days Keep your scheduled appointment with Dr. Ninetta Lights on 12/24/2012 Follow with Dr. Arbutus Ped on 12/30/2012 prior to her next scheduled cycle of chemotherapy

## 2012-12-21 NOTE — Progress Notes (Signed)
[facility] [phone]   [phone]  OFFICE PROGRESS NOTE  Roger Sax, MD [address]  [address] [place] [address]  DIAGNOSIS: Stage IIB/IIIA non-small cell lung cancer, squamous cell carcinoma presenting as a right Pancoast tumor   PRIOR THERAPY:  Concurrent chemoradiation with weekly carboplatin for AUC of 2 and paclitaxol 45 mg/M2, status post 5 weekly doses. Last dose was given on 08/24/2012 with no significant response.   CURRENT THERAPY:  Systemic chemotherapy with carboplatin for AUC of 5 on day 1 and gemcitabine 1000 mg/M2 on days 1 and 8 every 3 weeks. First dose given on 10/28/2012. Status post day 1 of cycle 1, day 8 held secondary to neutropenia. Aso status post day 1 of cycle 2  INTERVAL HISTORY: Roger Walton 57 y.o. male returns to the clinic today for followup visit  prior to proceeding with day 8 of cycle #2 of his systemic chemotherapy with carboplatin and gemcitabine. He continues to report that he has been more compliant with his antiseizure medications and has had no further seizure activity.  He voiced no specific complaints today.  He denied any diarrhea, or constipation. He denied having any significant weight loss or night sweats. He denied having any shortness breath, cough or hemoptysis. He has no nausea or vomiting. He denied having any significant weight loss or night sweats. He denied fever or chills. He reports that he is to see his infectious disease physician, Dr. Ninetta Lights on 12/24/2012. At that visit his CD4 counts and other parameters will be reviewed.  MEDICAL HISTORY: Past Medical History  Diagnosis Date  . 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 HIV infection   . SOB (shortness of breath)   . Chronic pain in right shoulder     scapula and arm for 2 months   . Anxiety     ALLERGIES:  has No Known  Allergies.  MEDICATIONS:  Current Outpatient Prescriptions  Medication Sig Dispense Refill  . carbamazepine (TEGRETOL) 200 MG tablet take 3 tablets by mouth every morning 2 AT NOON and 3 tablets by mouth at bedtime  240 tablet  6  . ciprofloxacin (CIPRO) 500 MG tablet Take 1 tablet (500 mg total) by mouth 2 (two) times daily.  10 tablet  0  . efavirenz-emtricitabine-tenofovir (ATRIPLA) 600-200-300 MG per tablet Take 1 tablet by mouth at bedtime.  90 tablet  3  . LORazepam (ATIVAN) 1 MG tablet Take 1 mg by mouth every 8 (eight) hours.      Marland Kitchen nystatin (MYCOSTATIN) 100000 UNIT/ML suspension 5 ml swish and spit four times a day  180 mL  0  . PHENobarbital (LUMINAL) 64.8 MG tablet Take 1 tablet (64.8 mg total) by mouth at bedtime.  90 tablet  2   No current facility-administered medications for this visit.    REVIEW OF SYSTEMS:  A comprehensive review of systems was negative.   PHYSICAL EXAMINATION: General appearance: alert, cooperative and no distress Head: Normocephalic, without obvious abnormality, atraumatic Neck: no adenopathy Lymph nodes: Cervical, supraclavicular, and axillary nodes normal. Resp: clear to auscultation bilaterally Cardio: regular rate and rhythm, S1, S2 normal, no murmur, click, rub or gallop GI: soft, non-tender; bowel sounds normal; no masses,  no organomegaly Extremities: extremities normal, atraumatic, no cyanosis or edema Neurologic: Alert and oriented X 3, normal strength and tone.  Normal symmetric reflexes. Normal coordination and gait   ECOG PERFORMANCE STATUS: 1 - Symptomatic but completely ambulatory  Blood pressure 104/73, pulse 70, temperature 98.2 F (36.8 C), temperature source Oral, resp. rate 18, height 5\' 9"  (1.753 m), weight 139 lb 8 oz (63.277 kg), SpO2 100.00%.  LABORATORY DATA: Lab Results  Component Value Date   WBC 0.9* 12/16/2012   HGB 13.0 12/16/2012   HCT 36.4* 12/16/2012   MCV 101.4* 12/16/2012   PLT 144 12/16/2012      Chemistry       Component Value Date/Time   NA 138 12/09/2012 1413   NA 140 12/09/2012 1119   K 4.2 12/09/2012 1413   K 5.0 12/09/2012 1119   CL 110 12/09/2012 1119   CL 103 08/24/2012 1129   CO2 20* 12/09/2012 1413   CO2 24 12/09/2012 1119   BUN 19.0 12/09/2012 1413   BUN 19 12/09/2012 1119   CREATININE 1.0 12/09/2012 1413   CREATININE 1.24 12/09/2012 1119   CREATININE 0.85 07/18/2012 1759      Component Value Date/Time   CALCIUM 8.5 12/09/2012 1413   CALCIUM 9.2 12/09/2012 1119   ALKPHOS 105 12/09/2012 1413   ALKPHOS 103 12/09/2012 1119   AST 24 12/09/2012 1413   AST 24 12/09/2012 1119   ALT 14 12/09/2012 1413   ALT 13 12/09/2012 1119   BILITOT <0.20 12/09/2012 1413   BILITOT 0.3 12/09/2012 1119       RADIOGRAPHIC STUDIES: Ct Chest W Contrast  09/28/2012   *RADIOLOGY REPORT*  Clinical Data: Lung cancer with chemotherapy and radiation therapy.  CT CHEST WITH CONTRAST  Technique:  Multidetector CT imaging of the chest was performed following the standard protocol during bolus administration of intravenous contrast.  Contrast: 80mL OMNIPAQUE IOHEXOL 300 MG/ML  SOLN  Comparison: PET 07/03/2012 and CT chest 06/24/2012.  Findings: Apical right upper lobe mass appears more prominent within the right supraclavicular region, with obscuration of fat planes and encasement of vessels (example image 4).  Within the apex of the right hemithorax, the soft tissue mass measures approximately 3.6 x 4.5 cm (image 6), previously 3.4 x 4.1 cm.  It appears contiguous with the upper esophagus (example image 10). Mediastinal lymph nodes measure up to 10 mm in the AP window, likely stable.  Right hilar lymphoid tissue measures 8 mm, stable. Axillary lymph nodes have fatty hila and are not considered enlarged.  Heart size normal.  No pericardial effusion.  Lungs are emphysematous.  A smudgy nodular density along the minor fissure (image 36) is unchanged.  Additional scattered nodular densities in the lungs measure up to 5 mm in the right lower  lobe (image 51), unchanged.  A vague irregular opacity in the left upper lobe (image 21) is difficult to measure and subjectively, appears unchanged.  No pleural fluid.  Airway is unremarkable.  Incidental imaging of the upper abdomen shows no acute findings.  A 1.9 cm low attenuation lesion in the right adrenal glands is unchanged.  An intermediate density lesion in the spleen measures 2.8 cm, stable.  No worrisome lytic or sclerotic lesions.  IMPRESSION:  1.  Apical segment right upper lobe mass with infiltration into the right supraclavicular fossa.  Findings appears slightly progressive from 06/24/2012. 2.  Scattered nodular densities in the lungs, unchanged. 3.  Right adrenal adenoma. 4.  Intermediate density splenic lesion, better seen on today's study and unchanged in retrospect. Given the absence of hypermetabolism on 07/03/2012, a hemangioma is favored.   Original Report  Authenticated By: Leanna Battles, M.D.    ASSESSMENT AND PLAN: This is a very pleasant 57 years old African American male with a right Pancoast tumor status post concurrent chemoradiation but unfortunately has no significant regression of his disease. He is not a surgical candidate for resection at this point. He is currently being treated with systemic chemotherapy with carboplatin for AUC of 5 on day 1 and gemcitabine 1000 mg/M2 on days 1 and 8 every 3 weeks. Status post 1 cycle, although day 8 of cycle 1 was held due to neutropenia. The patient was discussed with Dr. Truett Perna. He is again neutropenic neutropenic today with a total white count of 0.9 and an ANC of 0.2. To address his chemotherapy induced neutropenia he will be given 4 days of Neupogen at 300 mcg subcutaneously.  We still plan to add a Neulasta injection after her day 8 of each subsequent cycle to hopefully avoid further episodes of neutropenia and that would postpone his chemotherapy. His persistent neutropenia is concerning and may be multifactorial, due to both  chemotherapy induced implications as well as a function of his HIV status. He may need further dose reductions of his chemotherapy beginning of cycle #3. He will continue with weekly labs as scheduled. He'll follow with Dr. Arbutus Ped in 2 weeks prior to the start of cycle #3 with a repeat CBC differential C. met. Hopefully at that time we'll also have Dr. Moshe Cipro take on his persistent neutropenia.  Mickaela Starlin E, PA-C    He was advised to call immediately if he has any concerning symptoms in the interval. All questions were answered. The patient knows to call the clinic with any problems, questions or concerns. We can certainly see the patient much sooner if necessary.

## 2012-12-22 ENCOUNTER — Other Ambulatory Visit: Payer: Self-pay | Admitting: Medical Oncology

## 2012-12-22 ENCOUNTER — Ambulatory Visit (HOSPITAL_BASED_OUTPATIENT_CLINIC_OR_DEPARTMENT_OTHER): Payer: Medicare Other

## 2012-12-22 VITALS — BP 105/67 | HR 91 | Temp 98.9°F

## 2012-12-22 DIAGNOSIS — D702 Other drug-induced agranulocytosis: Secondary | ICD-10-CM

## 2012-12-22 DIAGNOSIS — D701 Agranulocytosis secondary to cancer chemotherapy: Secondary | ICD-10-CM

## 2012-12-22 DIAGNOSIS — D709 Neutropenia, unspecified: Secondary | ICD-10-CM

## 2012-12-22 MED ORDER — FILGRASTIM 300 MCG/0.5ML IJ SOLN
300.0000 ug | Freq: Once | INTRAMUSCULAR | Status: DC
  Administered 2012-12-22: 300 ug via SUBCUTANEOUS

## 2012-12-23 ENCOUNTER — Other Ambulatory Visit (HOSPITAL_BASED_OUTPATIENT_CLINIC_OR_DEPARTMENT_OTHER): Payer: Medicare Other

## 2012-12-23 ENCOUNTER — Ambulatory Visit (INDEPENDENT_AMBULATORY_CARE_PROVIDER_SITE_OTHER): Payer: Medicare Other | Admitting: Infectious Diseases

## 2012-12-23 ENCOUNTER — Encounter: Payer: Self-pay | Admitting: Infectious Diseases

## 2012-12-23 VITALS — BP 99/67 | HR 77 | Temp 98.2°F | Wt 142.0 lb

## 2012-12-23 DIAGNOSIS — C3491 Malignant neoplasm of unspecified part of right bronchus or lung: Secondary | ICD-10-CM

## 2012-12-23 DIAGNOSIS — Z79899 Other long term (current) drug therapy: Secondary | ICD-10-CM

## 2012-12-23 DIAGNOSIS — Z23 Encounter for immunization: Secondary | ICD-10-CM

## 2012-12-23 DIAGNOSIS — C3410 Malignant neoplasm of upper lobe, unspecified bronchus or lung: Secondary | ICD-10-CM

## 2012-12-23 DIAGNOSIS — Z113 Encounter for screening for infections with a predominantly sexual mode of transmission: Secondary | ICD-10-CM

## 2012-12-23 DIAGNOSIS — B20 Human immunodeficiency virus [HIV] disease: Secondary | ICD-10-CM

## 2012-12-23 DIAGNOSIS — F17200 Nicotine dependence, unspecified, uncomplicated: Secondary | ICD-10-CM

## 2012-12-23 LAB — CBC WITH DIFFERENTIAL/PLATELET
BASO%: 0.5 % (ref 0.0–2.0)
EOS%: 0.2 % (ref 0.0–7.0)
HCT: 35.6 % — ABNORMAL LOW (ref 38.4–49.9)
LYMPH%: 5.4 % — ABNORMAL LOW (ref 14.0–49.0)
MCH: 36.4 pg — ABNORMAL HIGH (ref 27.2–33.4)
MCHC: 33.6 g/dL (ref 32.0–36.0)
MONO%: 8.1 % (ref 0.0–14.0)
NEUT%: 85.8 % — ABNORMAL HIGH (ref 39.0–75.0)
Platelets: 162 10*3/uL (ref 140–400)
RBC: 3.29 10*6/uL — ABNORMAL LOW (ref 4.20–5.82)

## 2012-12-23 LAB — COMPREHENSIVE METABOLIC PANEL (CC13)
ALT: 13 U/L (ref 0–55)
BUN: 14 mg/dL (ref 7.0–26.0)
CO2: 24 mEq/L (ref 22–29)
Calcium: 8.7 mg/dL (ref 8.4–10.4)
Creatinine: 0.9 mg/dL (ref 0.7–1.3)
Total Bilirubin: <0.2 mg/dL (ref 0.20–1.20)

## 2012-12-23 NOTE — Progress Notes (Signed)
  Subjective:    Patient ID: Roger Walton, male    DOB: 08/12/1955, 57 y.o.   MRN: [id number]  HPI 57 yo AA M with HIV+ (on atripla, previous PI mutations), Stage IIB/IIIA non-small cell lung cancer, squamous cell carcinoma presenting as a right Pancoast tumor January 2014. Has been receiving concurrent chemoradiation with weekly carboplatin for AUC of 2 and paclitaxol  Now neutropenic (last CTX last week per wife). Has been having R back pain since XRT. He is not clear if he is going to have surgery.   HIV 1 RNA Quant (copies/mL)  Date Value  12/09/2012 <20   06/29/2012 54*  02/19/2012 90*     CD4 T Cell Abs (/uL)  Date Value  12/09/2012 120*  06/29/2012 160*  02/19/2012 120*   Still smoking, has cut back.   Review of Systems  Constitutional: Negative for appetite change and unexpected weight change.  Gastrointestinal: Negative for nausea, diarrhea and constipation.  Genitourinary: Negative for difficulty urinating.       Objective:   Physical Exam  Constitutional: He appears well-developed and well-nourished.  HENT:  Mouth/Throat: No oropharyngeal exudate.  Eyes: EOM are normal. Pupils are equal, round, and reactive to light.  Neck: Neck supple.  Cardiovascular: Normal rate, regular rhythm and normal heart sounds.   Pulmonary/Chest: Effort normal and breath sounds normal.  Abdominal: Soft. Bowel sounds are normal. There is no tenderness. There is no rebound.  Lymphadenopathy:    He has no cervical adenopathy.          Assessment & Plan:

## 2012-12-23 NOTE — Assessment & Plan Note (Signed)
Encouraged to quit. 

## 2012-12-23 NOTE — Assessment & Plan Note (Signed)
Will get counts rechecked today. Has gotten neupogen x 3 since his last wbc 0.9. My great appreciation to Dr Arbutus Ped.

## 2012-12-23 NOTE — Assessment & Plan Note (Addendum)
He is doing well, CD4 low from CTX.  He and his wife are offered condoms. They refuse. She is getting partner tested. Flu shot today. Will see him back in 4 months with labs prior.

## 2012-12-30 ENCOUNTER — Encounter: Payer: Self-pay | Admitting: Physician Assistant

## 2012-12-30 ENCOUNTER — Other Ambulatory Visit (HOSPITAL_BASED_OUTPATIENT_CLINIC_OR_DEPARTMENT_OTHER): Payer: Medicare Other | Admitting: Lab

## 2012-12-30 ENCOUNTER — Ambulatory Visit: Payer: Medicare Other

## 2012-12-30 ENCOUNTER — Telehealth: Payer: Self-pay | Admitting: Internal Medicine

## 2012-12-30 ENCOUNTER — Ambulatory Visit (HOSPITAL_BASED_OUTPATIENT_CLINIC_OR_DEPARTMENT_OTHER): Payer: Medicare Other | Admitting: Physician Assistant

## 2012-12-30 DIAGNOSIS — C3410 Malignant neoplasm of upper lobe, unspecified bronchus or lung: Secondary | ICD-10-CM

## 2012-12-30 DIAGNOSIS — C3491 Malignant neoplasm of unspecified part of right bronchus or lung: Secondary | ICD-10-CM

## 2012-12-30 LAB — CBC WITH DIFFERENTIAL/PLATELET
BASO%: 0.7 % (ref 0.0–2.0)
Basophils Absolute: 0 10*3/uL (ref 0.0–0.1)
Eosinophils Absolute: 0.1 10*3/uL (ref 0.0–0.5)
HCT: 34.7 % — ABNORMAL LOW (ref 38.4–49.9)
HGB: 12.2 g/dL — ABNORMAL LOW (ref 13.0–17.1)
LYMPH%: 31.6 % (ref 14.0–49.0)
MCHC: 35.2 g/dL (ref 32.0–36.0)
MONO#: 0.2 10*3/uL (ref 0.1–0.9)
NEUT%: 49.3 % (ref 39.0–75.0)
Platelets: 191 10*3/uL (ref 140–400)
WBC: 1.5 10*3/uL — ABNORMAL LOW (ref 4.0–10.3)
lymph#: 0.5 10*3/uL — ABNORMAL LOW (ref 0.9–3.3)

## 2012-12-30 NOTE — Patient Instructions (Addendum)
Due to your low white count we are discontinuing chemotherapy at this time You will follow with Dr. Arbutus Ped in approximately 3 weeks with a repeat CT scan of your chest to reevaluate your disease

## 2012-12-30 NOTE — Progress Notes (Addendum)
[facility] [phone]   Fax:([phone]  OFFICE PROGRESS NOTE  Roger Sax, MD [address]  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 Last dose was given on 08/24/2012 with no significant response.   CURRENT THERAPY:  Systemic chemotherapy with carboplatin for AUC of 5 on day 1 and gemcitabine 1000 mg/M2 on days 1 and 8 every 3 weeks. First dose given on 10/28/2012. Status post day 1 of cycle 1, day 8 held secondary to neutropenia. Aso status post day 1 of cycle 2  INTERVAL HISTORY: Roger Walton 57 y.o. male returns to the clinic today for followup visit  prior to proceeding with day 8 of cycle #2 of his systemic chemotherapy with carboplatin and gemcitabine. Today he is feeling fine and voices no specific complaints.  He denied any diarrhea, or constipation. He denied having any significant weight loss or night sweats. He denied having any shortness breath, cough or hemoptysis. He has no nausea or vomiting. He denied having any significant weight loss or night sweats. He denied fever or chills. He did see Dr. Ninetta Lights as scheduled on 12/24/2012. His CD4 count was a little low however Dr. Ninetta Lights felt that this was related to his receiving chemotherapy.  MEDICAL HISTORY: Past Medical History  Diagnosis Date  . 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 HIV infection   . SOB (shortness of breath)   . Chronic pain in right shoulder     scapula and arm for 2 months   . Anxiety     ALLERGIES:  has No Known Allergies.  MEDICATIONS:  Current Outpatient Prescriptions  Medication Sig Dispense Refill  . carbamazepine  (TEGRETOL) 200 MG tablet take 3 tablets by mouth every morning 2 AT NOON and 3 tablets by mouth at bedtime  240 tablet  6  . ciprofloxacin (CIPRO) 500 MG tablet Take 1 tablet (500 mg total) by mouth 2 (two) times daily.  10 tablet  0  . efavirenz-emtricitabine-tenofovir (ATRIPLA) 600-200-300 MG per tablet Take 1 tablet by mouth at bedtime.  90 tablet  3  . PHENobarbital (LUMINAL) 64.8 MG tablet Take 1 tablet (64.8 mg total) by mouth at bedtime.  90 tablet  2   No current facility-administered medications for this visit.    REVIEW OF SYSTEMS:  A comprehensive review of systems was negative.   PHYSICAL EXAMINATION: General appearance: alert, cooperative and no distress Head: Normocephalic, without obvious abnormality, atraumatic Neck: no adenopathy Lymph nodes: Cervical, supraclavicular, and axillary nodes normal. Resp: clear to auscultation bilaterally Cardio: regular rate and rhythm, S1, S2 normal, no murmur, click, rub or gallop GI: soft, non-tender; bowel sounds normal; no masses,  no organomegaly Extremities: extremities normal, atraumatic, no cyanosis or edema Neurologic: Alert and oriented X 3, normal strength and tone. Normal symmetric reflexes. Normal coordination and gait   ECOG PERFORMANCE STATUS: 1 - Symptomatic but completely ambulatory  Blood pressure 113/67, pulse 88, temperature 98.3 F (36.8 C), temperature source Oral, resp. rate 20, height 5\' 9"  (1.753 m), weight 147 lb 11.2 oz (66.996 kg).  LABORATORY DATA: Lab Results  Component Value Date  WBC 1.5* 12/30/2012   HGB 12.2* 12/30/2012   HCT 34.7* 12/30/2012   MCV 103.3* 12/30/2012   PLT 191 12/30/2012      Chemistry      Component Value Date/Time   NA 142 12/23/2012 1315   NA 140 12/09/2012 1119   K 4.0 12/23/2012 1315   K 5.0 12/09/2012 1119   CL 110 12/09/2012 1119   CL 103 08/24/2012 1129   CO2 24 12/23/2012 1315   CO2 24 12/09/2012 1119   BUN 14.0 12/23/2012 1315   BUN 19 12/09/2012 1119   CREATININE 0.9 12/23/2012  1315   CREATININE 1.24 12/09/2012 1119   CREATININE 0.85 07/18/2012 1759      Component Value Date/Time   CALCIUM 8.7 12/23/2012 1315   CALCIUM 9.2 12/09/2012 1119   ALKPHOS 127 12/23/2012 1315   ALKPHOS 103 12/09/2012 1119   AST 20 12/23/2012 1315   AST 24 12/09/2012 1119   ALT 13 12/23/2012 1315   ALT 13 12/09/2012 1119   BILITOT <0.20 12/23/2012 1315   BILITOT 0.3 12/09/2012 1119       RADIOGRAPHIC STUDIES: Ct Chest W Contrast  09/28/2012   *RADIOLOGY REPORT*  Clinical Data: Lung cancer with chemotherapy and radiation therapy.  CT CHEST WITH CONTRAST  Technique:  Multidetector CT imaging of the chest was performed following the standard protocol during bolus administration of intravenous contrast.  Contrast: 80mL OMNIPAQUE IOHEXOL 300 MG/ML  SOLN  Comparison: PET 07/03/2012 and CT chest 06/24/2012.  Findings: Apical right upper lobe mass appears more prominent within the right supraclavicular region, with obscuration of fat planes and encasement of vessels (example image 4).  Within the apex of the right hemithorax, the soft tissue mass measures approximately 3.6 x 4.5 cm (image 6), previously 3.4 x 4.1 cm.  It appears contiguous with the upper esophagus (example image 10). Mediastinal lymph nodes measure up to 10 mm in the AP window, likely stable.  Right hilar lymphoid tissue measures 8 mm, stable. Axillary lymph nodes have fatty hila and are not considered enlarged.  Heart size normal.  No pericardial effusion.  Lungs are emphysematous.  A smudgy nodular density along the minor fissure (image 36) is unchanged.  Additional scattered nodular densities in the lungs measure up to 5 mm in the right lower lobe (image 51), unchanged.  A vague irregular opacity in the left upper lobe (image 21) is difficult to measure and subjectively, appears unchanged.  No pleural fluid.  Airway is unremarkable.  Incidental imaging of the upper abdomen shows no acute findings.  A 1.9 cm low attenuation lesion in the right adrenal  glands is unchanged.  An intermediate density lesion in the spleen measures 2.8 cm, stable.  No worrisome lytic or sclerotic lesions.  IMPRESSION:  1.  Apical segment right upper lobe mass with infiltration into the right supraclavicular fossa.  Findings appears slightly progressive from 06/24/2012. 2.  Scattered nodular densities in the lungs, unchanged. 3.  Right adrenal adenoma. 4.  Intermediate density splenic lesion, better seen on today's study and unchanged in retrospect. Given the absence of hypermetabolism on 07/03/2012, a hemangioma is favored.   Original Report Authenticated By: Leanna Battles, M.D.    ASSESSMENT AND PLAN: This is a very pleasant 57 years old African American male with a right Pancoast tumor status post concurrent chemoradiation but unfortunately has no significant regression of his disease. He is not a surgical candidate for resection at this point. He is currently being treated with systemic chemotherapy with  carboplatin for AUC of 5 on day 1 and gemcitabine 1000 mg/M2 on days 1 and 8 every 3 weeks. Status post 2 cycles, although day 8 of cycle 1 and cycle 2 were held due to neutropenia. The patient was discussed with and seen by Dr. Arbutus Ped. He is again neutropenic neutropenic today with a total white count of 1.5 and an ANC of 0.8. At this point do to his persistent neutropenia we will discontinue chemotherapy and scheduled patient have a restaging CT scan of his chest with contrast. He will followup with Dr. Arbutus Ped in 3 weeks to discuss results of that restaging CT scan. Further treatment decisions will be made pending the results of the restaging CT scan of the chest.   Aidyn Kellis E, PA-C    He was advised to call immediately if he has any concerning symptoms in the interval. All questions were answered. The patient knows to call the clinic with any problems, questions or concerns. We can certainly see the patient much sooner if  necessary.  ADDENDUM: Hematology/Oncology Attending: I have a face to face encounter with the patient on the day of this visit. I recommended his care plan. The patient is a very pleasant 57 years old [place] American male with history of a stage IIIa non-small cell lung cancer currently undergoing systemic chemotherapy with carboplatin and gemcitabine but has some time with the treatment with persistent leukocytosis and neutropenia which in part is related to his chemotherapy but the patient also has HIV which may be a contributing factor for his neutropenia. The patient missed day 8 of every cycle. I recommended for the patient to discontinue his chemotherapy at this point. I will arrange for him to have repeat CT scan of the chest for restaging of his disease and he would come back for follow up visit in 3 weeks for discussion of his scan results and recommendation regarding his condition. Lajuana Matte., MD 01/01/2013

## 2012-12-30 NOTE — Telephone Encounter (Signed)
Gave pt appt for lab and MD on 9/30, chemo and labs cancelled per ML, Ct on 9/24, pt aware of all appts

## 2013-01-06 ENCOUNTER — Ambulatory Visit: Payer: Medicare Other

## 2013-01-06 ENCOUNTER — Other Ambulatory Visit: Payer: Medicare Other | Admitting: Lab

## 2013-01-13 ENCOUNTER — Encounter (HOSPITAL_COMMUNITY): Payer: Self-pay

## 2013-01-13 ENCOUNTER — Other Ambulatory Visit: Payer: Medicare Other

## 2013-01-13 ENCOUNTER — Ambulatory Visit (HOSPITAL_COMMUNITY)
Admission: RE | Admit: 2013-01-13 | Discharge: 2013-01-13 | Disposition: A | Discharge status: Home / Self Care | Payer: Medicare Other | Source: Ambulatory Visit | Attending: Physician Assistant | Admitting: Physician Assistant

## 2013-01-13 DIAGNOSIS — J438 Other emphysema: Secondary | ICD-10-CM | POA: Insufficient documentation

## 2013-01-13 DIAGNOSIS — C3410 Malignant neoplasm of upper lobe, unspecified bronchus or lung: Secondary | ICD-10-CM | POA: Insufficient documentation

## 2013-01-13 DIAGNOSIS — Z9221 Personal history of antineoplastic chemotherapy: Secondary | ICD-10-CM | POA: Insufficient documentation

## 2013-01-13 DIAGNOSIS — Z923 Personal history of irradiation: Secondary | ICD-10-CM | POA: Insufficient documentation

## 2013-01-13 DIAGNOSIS — M25519 Pain in unspecified shoulder: Secondary | ICD-10-CM | POA: Insufficient documentation

## 2013-01-13 DIAGNOSIS — D3500 Benign neoplasm of unspecified adrenal gland: Secondary | ICD-10-CM | POA: Insufficient documentation

## 2013-01-13 DIAGNOSIS — R918 Other nonspecific abnormal finding of lung field: Secondary | ICD-10-CM | POA: Insufficient documentation

## 2013-01-13 DIAGNOSIS — D739 Disease of spleen, unspecified: Secondary | ICD-10-CM | POA: Insufficient documentation

## 2013-01-13 MED ORDER — IOHEXOL 300 MG/ML  SOLN
80.0000 mL | Freq: Once | INTRAMUSCULAR | Status: AC | PRN
  Administered 2013-01-13: 80 mL via INTRAVENOUS

## 2013-01-19 ENCOUNTER — Other Ambulatory Visit: Payer: Medicare Other | Admitting: Lab

## 2013-01-19 ENCOUNTER — Ambulatory Visit: Payer: Medicare Other | Admitting: Internal Medicine

## 2013-02-15 ENCOUNTER — Other Ambulatory Visit: Payer: Self-pay | Admitting: *Deleted

## 2013-02-15 ENCOUNTER — Telehealth: Payer: Self-pay | Admitting: Internal Medicine

## 2013-02-15 NOTE — Telephone Encounter (Signed)
S/w the pt's wife and they are aware of the nov appts

## 2013-03-08 ENCOUNTER — Other Ambulatory Visit: Payer: Medicare Other | Admitting: Lab

## 2013-03-08 ENCOUNTER — Other Ambulatory Visit: Payer: Self-pay | Admitting: *Deleted

## 2013-03-08 ENCOUNTER — Ambulatory Visit: Payer: Medicare Other | Admitting: Internal Medicine

## 2013-03-08 NOTE — Progress Notes (Signed)
FTKA for lab and f/u with [facility] 03/08/13.  Onc tx schedule filled out.  SLJ

## 2013-03-09 ENCOUNTER — Telehealth: Payer: Self-pay | Admitting: Internal Medicine

## 2013-03-09 NOTE — Telephone Encounter (Signed)
s.w. pt and advised on DEC 1st appt...pt ok and aware

## 2013-03-22 ENCOUNTER — Encounter (INDEPENDENT_AMBULATORY_CARE_PROVIDER_SITE_OTHER): Payer: Self-pay

## 2013-03-22 ENCOUNTER — Telehealth: Payer: Self-pay | Admitting: Internal Medicine

## 2013-03-22 ENCOUNTER — Ambulatory Visit (HOSPITAL_BASED_OUTPATIENT_CLINIC_OR_DEPARTMENT_OTHER): Payer: Medicare Other | Admitting: Internal Medicine

## 2013-03-22 ENCOUNTER — Other Ambulatory Visit (HOSPITAL_BASED_OUTPATIENT_CLINIC_OR_DEPARTMENT_OTHER): Payer: Medicare Other

## 2013-03-22 ENCOUNTER — Encounter: Payer: Self-pay | Admitting: Internal Medicine

## 2013-03-22 DIAGNOSIS — M25519 Pain in unspecified shoulder: Secondary | ICD-10-CM

## 2013-03-22 DIAGNOSIS — C3491 Malignant neoplasm of unspecified part of right bronchus or lung: Secondary | ICD-10-CM

## 2013-03-22 DIAGNOSIS — D702 Other drug-induced agranulocytosis: Secondary | ICD-10-CM

## 2013-03-22 DIAGNOSIS — B20 Human immunodeficiency virus [HIV] disease: Secondary | ICD-10-CM

## 2013-03-22 DIAGNOSIS — C3410 Malignant neoplasm of upper lobe, unspecified bronchus or lung: Secondary | ICD-10-CM

## 2013-03-22 LAB — CBC WITH DIFFERENTIAL/PLATELET
Basophils Absolute: 0 10*3/uL (ref 0.0–0.1)
EOS%: 2.2 % (ref 0.0–7.0)
Eosinophils Absolute: 0.1 10*3/uL (ref 0.0–0.5)
LYMPH%: 19.1 % (ref 14.0–49.0)
MCH: 36.2 pg — ABNORMAL HIGH (ref 27.2–33.4)
MONO#: 0.3 10*3/uL (ref 0.1–0.9)
MONO%: 13.2 % (ref 0.0–14.0)
NEUT#: 1.7 10*3/uL (ref 1.5–6.5)
RBC: 3.83 10*6/uL — ABNORMAL LOW (ref 4.20–5.82)
lymph#: 0.5 10*3/uL — ABNORMAL LOW (ref 0.9–3.3)

## 2013-03-22 LAB — COMPREHENSIVE METABOLIC PANEL (CC13)
Albumin: 3.6 g/dL (ref 3.5–5.0)
Anion Gap: 8 mEq/L (ref 3–11)
BUN: 18.6 mg/dL (ref 7.0–26.0)
CO2: 26 mEq/L (ref 22–29)
Glucose: 95 mg/dl (ref 70–140)
Sodium: 140 mEq/L (ref 136–145)
Total Bilirubin: 0.23 mg/dL (ref 0.20–1.20)
Total Protein: 8.3 g/dL (ref 6.4–8.3)

## 2013-03-22 NOTE — Patient Instructions (Signed)
Followup visit in 2 weeks after repeating CT scan of the chest, abdomen and pelvis.

## 2013-03-22 NOTE — Progress Notes (Signed)
[facility] Telephone:(336) [phone]   Fax:(336) [phone]  OFFICE PROGRESS NOTE  Roger Sax, MD [address]  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 Last dose was given on 08/24/2012 with no significant response.   CURRENT THERAPY:  Systemic chemotherapy with carboplatin for AUC of 5 on day 1 and gemcitabine 1000 mg/M2 on days 1 and 8 every 3 weeks. First dose given on 10/28/2012. Status post day 1 of cycle 1, day 8 held secondary to neutropenia. Aso status post day 1 of cycle 2  INTERVAL HISTORY: Roger Walton 57 y.o. male returns to the clinic today for followup visit. The patient was supposed to come back for followup visit in late September of 2014 after repeating CT scan of the chest for evaluation of his disease but he lost to follow up. He came today complaining of pain on the right shoulder area that was getting worse over the last few weeks.  The patient denied having any other significant complaints. He denied having any fever or chills. He denied having any nausea or vomiting. The patient denied having any significant cough or hemoptysis.  MEDICAL HISTORY: Past Medical History  Diagnosis Date  . 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 HIV infection   . SOB (shortness of breath)   . Chronic pain in right shoulder     scapula and arm for 2 months   . Anxiety     ALLERGIES:  has No Known Allergies.  MEDICATIONS:  Current Outpatient Prescriptions  Medication Sig Dispense Refill  . carbamazepine (TEGRETOL) 200 MG tablet take 3 tablets by mouth every morning 2 AT NOON and 3 tablets by mouth at bedtime  240 tablet  6    . efavirenz-emtricitabine-tenofovir (ATRIPLA) 600-200-300 MG per tablet Take 1 tablet by mouth at bedtime.  90 tablet  3  . PHENobarbital (LUMINAL) 64.8 MG tablet Take 1 tablet (64.8 mg total) by mouth at bedtime.  90 tablet  2  . ciprofloxacin (CIPRO) 500 MG tablet Take 1 tablet (500 mg total) by mouth 2 (two) times daily.  10 tablet  0   No current facility-administered medications for this visit.    REVIEW OF SYSTEMS:  A comprehensive review of systems was negative except for: Musculoskeletal: positive for Pain in the right shoulder area   PHYSICAL EXAMINATION: General appearance: alert, cooperative and no distress Head: Normocephalic, without obvious abnormality, atraumatic Neck: no adenopathy Lymph nodes: Cervical, supraclavicular, and axillary nodes normal. Resp: clear to auscultation bilaterally Cardio: regular rate and rhythm, S1, S2 normal, no murmur, click, rub or gallop GI: soft, non-tender; bowel sounds normal; no masses,  no organomegaly Extremities: extremities normal, atraumatic, no cyanosis or edema Neurologic: Alert and oriented X 3, normal strength and tone. Normal symmetric reflexes. Normal coordination and gait   ECOG PERFORMANCE STATUS: 1 - Symptomatic but completely ambulatory  There were no vitals taken for this visit.  LABORATORY DATA: Lab Results  Component Value Date   WBC 2.6* 03/22/2013   HGB 13.8 03/22/2013   HCT 41.3 03/22/2013   MCV 108.0* 03/22/2013   PLT 226 03/22/2013  Chemistry      Component Value Date/Time   NA 142 12/23/2012 1315   NA 140 12/09/2012 1119   K 4.0 12/23/2012 1315   K 5.0 12/09/2012 1119   CL 110 12/09/2012 1119   CL 103 08/24/2012 1129   CO2 24 12/23/2012 1315   CO2 24 12/09/2012 1119   BUN 14.0 12/23/2012 1315   BUN 19 12/09/2012 1119   CREATININE 0.9 12/23/2012 1315   CREATININE 1.24 12/09/2012 1119   CREATININE 0.85 07/18/2012 1759      Component Value Date/Time   CALCIUM 8.7 12/23/2012 1315   CALCIUM 9.2 12/09/2012 1119    ALKPHOS 127 12/23/2012 1315   ALKPHOS 103 12/09/2012 1119   AST 20 12/23/2012 1315   AST 24 12/09/2012 1119   ALT 13 12/23/2012 1315   ALT 13 12/09/2012 1119   BILITOT <0.20 12/23/2012 1315   BILITOT 0.3 12/09/2012 1119       RADIOGRAPHIC STUDIES: CT CHEST WITH CONTRAST  Technique: Multidetector CT imaging of the chest was performed  following the standard protocol during bolus administration of  intravenous contrast.  Contrast: 80mL OMNIPAQUE IOHEXOL 300 MG/ML SOLN  Comparison: 09/28/2012 and PET 07/03/2012.  Findings: Infiltrative soft tissue is again seen in the right  supraclavicular fossa with the superior aspect incompletely imaged.  There is vascular encasement with inferior extension into the apex  of the right hemithorax. When measured at the same level as on the  prior exam, it measures approximately 3.2 x 3.3 cm (previously 3.6  x 4.5 cm). No pathologically enlarged mediastinal, hilar or  axillary lymph nodes. Heart size normal. No pericardial effusion.  Centrilobular emphysema. Right apical mass is discussed in the  previous paragraph. A nodule in the superior segment right lower  lobe has enlarged, now measuring 11 x 12 mm (previously 4 mm).  Additional small pulmonary nodules in the right lower lobe,  measuring up to 5 mm, are stable. An irregular density in the left  upper lobe (image 20) is difficult to measure but subjectively,  appears unchanged. No pleural fluid. Airway is unremarkable.  Incidental imaging of the upper abdomen shows a 2.2 x 1.6 cm low  attenuation lesion in the right adrenal gland, stable. A 2.7 cm  intermediate attenuation lesion in the lateral spleen is unchanged.  However, there may be a new 1.3 cm lesion in the posterior spleen  (image 62). Although not definitely seen on prior exams, there is a  similar contour bulge along the posteromedial spleen, suggesting  presence on prior exams. Heterogeneity is seen inferiorly within  the spleen as well  (image 65). No worrisome lytic or sclerotic  lesions.  IMPRESSION:  1. Interval decrease in size of a right superior sulcus tumor with  associated vascular encasement.  2. Enlarging right lower lobe nodule, most consistent with  bronchogenic carcinoma.  3. Additional scattered pulmonary nodules and irregular nodular  densities are unchanged. Continued attention on follow-up exams is  warranted.  4. Dominant lesion with the spleen is stable. Additional lesions  or heterogeneity within the spleen, as discussed above. Continued  attention on follow-up exams is warranted.  5. Right adrenal adenoma.  Original Report Authenticated By: Leanna Battles, M.D.   ASSESSMENT AND PLAN: The patient is a very pleasant 57 years old African American male with history of a stage IIIa non-small cell lung cancer currently undergoing systemic chemotherapy with carboplatin and gemcitabine but has some time with the treatment with persistent leukocytosis and neutropenia which in part  is related to his chemotherapy but the patient also has HIV which may be a contributing factor for his neutropenia. The patient missed day 8 of every cycle. His last CT scan of the chest was done in September of 2014 and the patient continues to complain of increasing right shoulder pain. I recommended for him to have repeat CT scan of the chest, abdomen and pelvis for restaging of his disease and to rule out any tumor invasion of the right shoulder area. I would see him back for followup visit in 2 weeks for evaluation and discussion of his treatment options based on the imaging results. He was strongly advised to keep his appointment. He was also advised to call immediately if he has any concerning symptoms in the interval.  Lajuana Matte., MD 03/22/2013

## 2013-03-22 NOTE — Telephone Encounter (Signed)
made appts per 12/1 POF AVS and CAL given to pt shh

## 2013-03-24 ENCOUNTER — Telehealth: Payer: Self-pay | Admitting: Medical Oncology

## 2013-03-24 MED ORDER — OXYCODONE-ACETAMINOPHEN 5-325 MG PO TABS: 1.0000 | ORAL_TABLET | Freq: Four times a day (QID) | ORAL | Status: DC | PRN

## 2013-03-24 NOTE — Telephone Encounter (Signed)
RX locked in injection room and [name] notified to pick up.

## 2013-03-24 NOTE — Telephone Encounter (Signed)
Message copied by Charma Igo on Wed Mar 24, 2013 10:50 AM ------      Message from: Si Gaul      Created: Tue Mar 23, 2013  6:58 PM       Percocet 5/325 q 6 hours.# 30      ----- Message -----         From: Charma Igo, RN         Sent: 03/23/2013   4:55 PM           To: Si Gaul, MD            Request pain med for shoulder .       ------

## 2013-04-05 ENCOUNTER — Encounter (HOSPITAL_COMMUNITY): Payer: Self-pay

## 2013-04-05 ENCOUNTER — Ambulatory Visit (HOSPITAL_COMMUNITY)
Admission: RE | Admit: 2013-04-05 | Discharge: 2013-04-05 | Disposition: A | Discharge status: Home / Self Care | Payer: Medicare Other | Source: Ambulatory Visit | Attending: Internal Medicine | Admitting: Internal Medicine

## 2013-04-05 DIAGNOSIS — D739 Disease of spleen, unspecified: Secondary | ICD-10-CM | POA: Insufficient documentation

## 2013-04-05 DIAGNOSIS — C3490 Malignant neoplasm of unspecified part of unspecified bronchus or lung: Secondary | ICD-10-CM | POA: Insufficient documentation

## 2013-04-05 DIAGNOSIS — Z9221 Personal history of antineoplastic chemotherapy: Secondary | ICD-10-CM | POA: Insufficient documentation

## 2013-04-05 MED ORDER — IOHEXOL 300 MG/ML  SOLN
100.0000 mL | Freq: Once | INTRAMUSCULAR | Status: AC | PRN
  Administered 2013-04-05: 100 mL via INTRAVENOUS

## 2013-04-06 ENCOUNTER — Telehealth: Payer: Self-pay | Admitting: Internal Medicine

## 2013-04-06 ENCOUNTER — Ambulatory Visit (HOSPITAL_BASED_OUTPATIENT_CLINIC_OR_DEPARTMENT_OTHER): Payer: Medicare Other | Admitting: Physician Assistant

## 2013-04-06 ENCOUNTER — Encounter: Payer: Self-pay | Admitting: Physician Assistant

## 2013-04-06 ENCOUNTER — Other Ambulatory Visit (HOSPITAL_BASED_OUTPATIENT_CLINIC_OR_DEPARTMENT_OTHER): Payer: Medicare Other

## 2013-04-06 DIAGNOSIS — C3410 Malignant neoplasm of upper lobe, unspecified bronchus or lung: Secondary | ICD-10-CM

## 2013-04-06 DIAGNOSIS — M25519 Pain in unspecified shoulder: Secondary | ICD-10-CM

## 2013-04-06 DIAGNOSIS — R911 Solitary pulmonary nodule: Secondary | ICD-10-CM

## 2013-04-06 DIAGNOSIS — G893 Neoplasm related pain (acute) (chronic): Secondary | ICD-10-CM

## 2013-04-06 DIAGNOSIS — B20 Human immunodeficiency virus [HIV] disease: Secondary | ICD-10-CM

## 2013-04-06 LAB — CBC WITH DIFFERENTIAL/PLATELET
BASO%: 0.4 % (ref 0.0–2.0)
Basophils Absolute: 0 10*3/uL (ref 0.0–0.1)
EOS%: 0.5 % (ref 0.0–7.0)
Eosinophils Absolute: 0 10*3/uL (ref 0.0–0.5)
HCT: 40.6 % (ref 38.4–49.9)
LYMPH%: 14.1 % (ref 14.0–49.0)
MCHC: 34.7 g/dL (ref 32.0–36.0)
MCV: 105.1 fL — ABNORMAL HIGH (ref 79.3–98.0)
MONO#: 0.4 10*3/uL (ref 0.1–0.9)
MONO%: 6.2 % (ref 0.0–14.0)
NEUT#: 4.7 10*3/uL (ref 1.5–6.5)
NEUT%: 78.8 % — ABNORMAL HIGH (ref 39.0–75.0)
Platelets: 269 10*3/uL (ref 140–400)
RBC: 3.86 10*6/uL — ABNORMAL LOW (ref 4.20–5.82)
lymph#: 0.8 10*3/uL — ABNORMAL LOW (ref 0.9–3.3)

## 2013-04-06 LAB — COMPREHENSIVE METABOLIC PANEL (CC13)
ALT: 13 U/L (ref 0–55)
AST: 20 U/L (ref 5–34)
Albumin: 3.4 g/dL — ABNORMAL LOW (ref 3.5–5.0)
Alkaline Phosphatase: 94 U/L (ref 40–150)
BUN: 9.8 mg/dL (ref 7.0–26.0)
CO2: 26 mEq/L (ref 22–29)
Calcium: 9.2 mg/dL (ref 8.4–10.4)
Chloride: 102 mEq/L (ref 98–109)
Creatinine: 1 mg/dL (ref 0.7–1.3)
Potassium: 4.1 mEq/L (ref 3.5–5.1)
Total Protein: 7.7 g/dL (ref 6.4–8.3)

## 2013-04-06 MED ORDER — OXYCODONE-ACETAMINOPHEN 10-325 MG PO TABS: 1.0000 | ORAL_TABLET | Freq: Four times a day (QID) | ORAL | Status: DC | PRN

## 2013-04-06 MED ORDER — MORPHINE SULFATE ER 15 MG PO TBCR: EXTENDED_RELEASE_TABLET | ORAL | Status: DC

## 2013-04-06 NOTE — Progress Notes (Addendum)
[facility] [phone]   [phone]  OFFICE PROGRESS NOTE  Roger Sax, MD [address]  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 Last dose was given on 08/24/2012 with no significant response.   CURRENT THERAPY:  Systemic chemotherapy with carboplatin for AUC of 5 on day 1 and gemcitabine 1000 mg/M2 on days 1 and 8 every 3 weeks. First dose given on 10/28/2012. Status post day 1 of cycle 1, day 8 held secondary to neutropenia. Aso status post day 1 of cycle 2  INTERVAL HISTORY: Roger Walton 57 y.o. male returns to the clinic today for followup visit. He continues to complain of bone pain involving his spine and right shoulder as well as down the right arm. Request a refill for his Percocet. He states that he has to take 2 of the 5/325 mg tablets to get any sort of relief. He is wondering if there is a stronger dose available. He recently had a restaging CT scan of the chest, abdomen pelvis and presents today to discuss those results.  The patient denied having any other significant complaints. He denied having any fever or chills. He denied having any nausea or vomiting. The patient denied having any significant cough or hemoptysis.  MEDICAL HISTORY: Past Medical History  Diagnosis Date  . 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 HIV infection   . SOB (shortness of breath)   . Chronic pain in right shoulder     scapula and arm for 2 months   . Anxiety     ALLERGIES:  has No Known Allergies.  MEDICATIONS:  Current Outpatient Prescriptions  Medication Sig Dispense Refill  . carbamazepine (TEGRETOL) 200 MG  tablet take 3 tablets by mouth every morning 2 AT NOON and 3 tablets by mouth at bedtime  240 tablet  6  . ciprofloxacin (CIPRO) 500 MG tablet Take 1 tablet (500 mg total) by mouth 2 (two) times daily.  10 tablet  0  . efavirenz-emtricitabine-tenofovir (ATRIPLA) 600-200-300 MG per tablet Take 1 tablet by mouth at bedtime.  90 tablet  3  . morphine (MS CONTIN) 15 MG 12 hr tablet Take 1 tablet by mouth every 12 hours  60 tablet  0  . oxyCODONE-acetaminophen (PERCOCET) 10-325 MG per tablet Take 1 tablet by mouth every 6 (six) hours as needed for pain.  40 tablet  0  . PHENobarbital (LUMINAL) 64.8 MG tablet Take 1 tablet (64.8 mg total) by mouth at bedtime.  90 tablet  2   No current facility-administered medications for this visit.    REVIEW OF SYSTEMS:  A comprehensive review of systems was negative except for: Musculoskeletal: positive for bone pain and Pain in the right shoulder area   PHYSICAL EXAMINATION: General appearance: alert, cooperative and no distress Head: Normocephalic, without obvious abnormality, atraumatic Neck: no adenopathy Lymph nodes: Cervical, supraclavicular, and axillary nodes normal. Resp: clear to auscultation bilaterally Back: symmetric, no curvature. ROM normal. No CVA tenderness., Point tenderness in the mid thoracic vertebra as well as a cross the right scapula and to the right shoulder girdle Cardio: regular rate and  rhythm, S1, S2 normal, no murmur, click, rub or gallop GI: soft, non-tender; bowel sounds normal; no masses,  no organomegaly Extremities: extremities normal, atraumatic, no cyanosis or edema Neurologic: Alert and oriented X 3, normal strength and tone. Normal symmetric reflexes. Normal coordination and gait   ECOG PERFORMANCE STATUS: 1 - Symptomatic but completely ambulatory  Blood pressure 125/88, pulse 94, temperature 99.4 F (37.4 C), temperature source Oral, resp. rate 18, height 5\' 9"  (1.753 m), weight 141 lb 11.2 oz (64.275 kg).  LABORATORY  DATA: Lab Results  Component Value Date   WBC 5.9 04/06/2013   HGB 14.1 04/06/2013   HCT 40.6 04/06/2013   MCV 105.1* 04/06/2013   PLT 269 04/06/2013      Chemistry      Component Value Date/Time   NA 136 04/06/2013 0914   NA 140 12/09/2012 1119   K 4.1 04/06/2013 0914   K 5.0 12/09/2012 1119   CL 110 12/09/2012 1119   CL 103 08/24/2012 1129   CO2 26 04/06/2013 0914   CO2 24 12/09/2012 1119   BUN 9.8 04/06/2013 0914   BUN 19 12/09/2012 1119   CREATININE 1.0 04/06/2013 0914   CREATININE 1.24 12/09/2012 1119   CREATININE 0.85 07/18/2012 1759      Component Value Date/Time   CALCIUM 9.2 04/06/2013 0914   CALCIUM 9.2 12/09/2012 1119   ALKPHOS 94 04/06/2013 0914   ALKPHOS 103 12/09/2012 1119   AST 20 04/06/2013 0914   AST 24 12/09/2012 1119   ALT 13 04/06/2013 0914   ALT 13 12/09/2012 1119   BILITOT 0.37 04/06/2013 0914   BILITOT 0.3 12/09/2012 1119       RADIOGRAPHIC STUDIES: Ct Chest W Contrast  04/05/2013   CLINICAL DATA:  Lung cancer diagnosed in March 2014. Chemotherapy completed. Restaging exam.  EXAM: CT CHEST, ABDOMEN, AND PELVIS WITH CONTRAST  TECHNIQUE: Multidetector CT imaging of the chest, abdomen and pelvis was performed following the standard protocol during bolus administration of intravenous contrast.  CONTRAST:  OMNIPAQUE IOHEXOL 300 MG/ML  SOLN  COMPARISON:  None.  CT 01/13/2013,  FINDINGS: CT CHEST FINDINGS  Right lower lobe lesion has increased in size measuring 18 x 18 mm compared to 12 mm x 11 mm on 01/13/2013. Ill-defined branching nodular pattern in the left upper lobe (image 26) is unchanged from prior.  The right superior sulcus tumor measures 27 x 26 mm decreased from 32 x 32 mm on prior.  There is no axillary or supraclavicular lymphadenopathy. No mediastinal hilar lymphadenopathy. No pericardial fluid. Esophagus is normal.  CT ABDOMEN AND PELVIS FINDINGS  No focal hepatic lesion. Gallbladder, pancreas, and kidneys are normal. There is nodular enlargement  of the right adrenal gland which has Hounsfield views consistent with a benign adenoma. There is a low-density lesion in the spleen measuring 26 mm (image 62). This was not hypermetabolic on comparison PET-CT scan and appear to be present on earlier CT 06/24/2012.  Stomach, small bowel, cecum are normal. There is a moderate volume stool throughout the colon and rectum. No retroperitoneal periportal lymphadenopathy. No peritoneal disease. Review of bone windows demonstrates no aggressive osseous lesions.  IMPRESSION: 1. Size increase in right lower lobe pulmonary nodule consistent with bronchogenic carcinoma. 2. Continued decrease in size of right apical tumor. 3. Stable splenic lesions.  Legrand Rams this to be benign.   Electronically Signed   By: Genevive Bi M.D.   On: 04/05/2013 16:50   Ct Abdomen Pelvis W Contrast  04/05/2013  CLINICAL DATA:  Lung cancer diagnosed in March 2014. Chemotherapy completed. Restaging exam.  EXAM: CT CHEST, ABDOMEN, AND PELVIS WITH CONTRAST  TECHNIQUE: Multidetector CT imaging of the chest, abdomen and pelvis was performed following the standard protocol during bolus administration of intravenous contrast.  CONTRAST:  OMNIPAQUE IOHEXOL 300 MG/ML  SOLN  COMPARISON:  None.  CT 01/13/2013,  FINDINGS: CT CHEST FINDINGS  Right lower lobe lesion has increased in size measuring 18 x 18 mm compared to 12 mm x 11 mm on 01/13/2013. Ill-defined branching nodular pattern in the left upper lobe (image 26) is unchanged from prior.  The right superior sulcus tumor measures 27 x 26 mm decreased from 32 x 32 mm on prior.  There is no axillary or supraclavicular lymphadenopathy. No mediastinal hilar lymphadenopathy. No pericardial fluid. Esophagus is normal.  CT ABDOMEN AND PELVIS FINDINGS  No focal hepatic lesion. Gallbladder, pancreas, and kidneys are normal. There is nodular enlargement of the right adrenal gland which has Hounsfield views consistent with a benign adenoma. There is a  low-density lesion in the spleen measuring 26 mm (image 62). This was not hypermetabolic on comparison PET-CT scan and appear to be present on earlier CT 06/24/2012.  Stomach, small bowel, cecum are normal. There is a moderate volume stool throughout the colon and rectum. No retroperitoneal periportal lymphadenopathy. No peritoneal disease. Review of bone windows demonstrates no aggressive osseous lesions.  IMPRESSION: 1. Size increase in right lower lobe pulmonary nodule consistent with bronchogenic carcinoma. 2. Continued decrease in size of right apical tumor. 3. Stable splenic lesions.  Legrand Rams this to be benign.   Electronically Signed   By: Genevive Bi M.D.   On: 04/05/2013 16:50    ASSESSMENT AND PLAN: The patient is a very pleasant 57 years old [place] American male with history of a stage IIIa non-small cell lung cancer currently undergoing systemic chemotherapy with carboplatin and gemcitabine but has some time with the treatment with persistent leukocytosis and neutropenia which in part is related to his chemotherapy but the patient also has HIV which may be a contributing factor for his neutropenia. The patient missed day 8 of every cycle. Patient was discussed with also seen by Dr. Arbutus Ped. Overall his CT scan is stable with the exception of an enlarged nodule in the right lower lobe. As his counts will not tolerate chemotherapy, we'll place the patient on observation and have him return in 3 months with a restaging CT scan of the chest, abdomen and pelvis with contrast to reevaluate his disease. For the enlarged, right lower lobe nodule we will refer him to radiation oncology for palliative radiotherapy to this area. For pain management patient be increased to a 10/325 mg Percocet tablet, one every 6 hours as needed for pain a total 40 with no refill. As he is on observation further prescriptions for his pain medication should come from his primary care physician. He was advised to call  immediately if he has any concerning symptoms in the interval.   Conni Slipper, PA-C 04/06/2013  ADDENDUM: Hematology/Oncology Attending: I had a face to face encounter with the patient. I recommended his care plan. He is a very pleasant 57 years old [place] American male with history of stage IIIa non-small cell lung cancer status post concurrent chemoradiation followed by systemic chemotherapy with carboplatin and gemcitabine but unfortunately he was unable to take his treatment fairly well. He has been observation for the last few months and recent CT scan of the chest  showed increase in the size of a right lower lobe pulmonary nodule but continued decrease in the size of the right apical tumor. I discussed the scan results and showed the images to the patient and his wife. In the absence of any other progressive disease, I would refer the patient to Dr. Kathrynn Running for consideration of stereotactic radiotherapy to the enlarging right lower lobe nodule. I will see the patient back for followup visit in 3 months with repeat CT scan of the chest, abdomen and pelvis for restaging of his disease. For pain management the patient was given a refill for Percocet. He was advised to call immediately if he has any concerning symptoms in the interval. Lajuana Matte., MD 04/07/2013

## 2013-04-06 NOTE — Telephone Encounter (Signed)
appts made per 12/16 POF AVS Barium and CAL given shh

## 2013-04-07 ENCOUNTER — Other Ambulatory Visit: Payer: Self-pay | Admitting: *Deleted

## 2013-04-07 NOTE — Patient Instructions (Signed)
Keep the appointment with radiation oncology regarding palliative radiotherapy to the enlarged right lower lobe nodule Followup in 3 months for repeat staging CT scan of your chest, abdomen and pelvis to reevaluate your disease Your pain prescription refills should come from your primary care physician moving forward

## 2013-04-12 ENCOUNTER — Telehealth: Payer: Self-pay | Admitting: *Deleted

## 2013-04-12 MED ORDER — MORPHINE SULFATE ER 15 MG PO TBCR: 15.0000 mg | EXTENDED_RELEASE_TABLET | Freq: Two times a day (BID) | ORAL | Status: DC

## 2013-04-12 MED ORDER — OXYCODONE-ACETAMINOPHEN 10-325 MG PO TABS: 1.0000 | ORAL_TABLET | Freq: Four times a day (QID) | ORAL | Status: DC | PRN

## 2013-04-12 NOTE — Telephone Encounter (Signed)
Pt has been taking 2 oxycodone tablets every 6 hrs.  He is out of his oxycodone.  The rx was written on 12/16.  Per Dr Donnald Garre, okay to give rx for morphine 15mg  BID and can refill his oxycodone.  Discussed in detail regarding long acting and short acting pain medication.  Asked for them to call if he does not have any pain control with his new rx.  SLJ

## 2013-04-26 ENCOUNTER — Ambulatory Visit: Payer: Medicare Other | Admitting: Infectious Diseases

## 2013-04-27 ENCOUNTER — Encounter: Payer: Self-pay | Admitting: Radiation Oncology

## 2013-04-27 NOTE — Progress Notes (Signed)
Thoracic Location of Tumor / Histology: Stage IIB/IIIA non small cell lung cancer, squamous cell carcinoma presenting as a right Pancoast tumor  Patient presented March 2014 with complaining of right shoulder pain x several months.  Biopsies of right lung apical mass: malignant cells consistent with non small cell carcinoma, favoring squamous cell carcinoma  Tobacco/Marijuana/Snuff/ETOH use: 40 pack year history of smoking  Past/Anticipated interventions by cardiothoracic surgery, if any: fine needle biopsy of right lung mass  Past/Anticipated interventions by medical oncology, if any:   PRIOR THERAPY: Concurrent chemoradiation with weekly carboplatin for AUC of 2 and paclitaxol 45 mg/M2, status post 5 weekly doses. Last dose was given on 08/24/2012 with no significant response.  CURRENT THERAPY: Systemic chemotherapy with carboplatin for AUC of 5 on day 1 and gemcitabine 1000 mg/M2 on days 1 and 8 every 3 weeks. First dose given on 10/28/2012. Status post day 1 of cycle 1, day 8 held secondary to neutropenia. Aso status post day 1 of cycle 2   Signs/Symptoms  Weight changes, if any: denies  Respiratory complaints, if any: denies   Hemoptysis, if any: denies   Pain issues, if any:  Complains of bone pain in spine, right shoulder, and right arm  SAFETY ISSUES:  Prior radiation? NO  Pacemaker/ICD? NO  Possible current pregnancy? NO  Is the patient on methotrexate? NO  Current Complaints / other details:  58 year old male. HIV positive. Referred by [facility] for palliative radiotherapy to enlarging nodule right lower lobe lung. Counts will no longer tolerate chemotherapy. Per AJ PCP to prescribe further pain medication.

## 2013-04-28 ENCOUNTER — Ambulatory Visit
Admission: RE | Admit: 2013-04-28 | Discharge: 2013-04-28 | Disposition: A | Discharge status: Home / Self Care | Payer: Medicare Other | Source: Ambulatory Visit | Attending: Radiation Oncology | Admitting: Radiation Oncology

## 2013-04-28 ENCOUNTER — Encounter: Payer: Self-pay | Admitting: Radiation Oncology

## 2013-04-28 DIAGNOSIS — M25511 Pain in right shoulder: Secondary | ICD-10-CM

## 2013-04-28 DIAGNOSIS — M79609 Pain in unspecified limb: Secondary | ICD-10-CM | POA: Insufficient documentation

## 2013-04-28 DIAGNOSIS — R911 Solitary pulmonary nodule: Secondary | ICD-10-CM | POA: Insufficient documentation

## 2013-04-28 DIAGNOSIS — C3490 Malignant neoplasm of unspecified part of unspecified bronchus or lung: Secondary | ICD-10-CM | POA: Insufficient documentation

## 2013-04-28 DIAGNOSIS — M25519 Pain in unspecified shoulder: Secondary | ICD-10-CM | POA: Insufficient documentation

## 2013-04-28 DIAGNOSIS — R209 Unspecified disturbances of skin sensation: Secondary | ICD-10-CM | POA: Insufficient documentation

## 2013-04-28 DIAGNOSIS — Z79899 Other long term (current) drug therapy: Secondary | ICD-10-CM | POA: Insufficient documentation

## 2013-04-28 MED ORDER — GABAPENTIN 300 MG PO CAPS: 300.0000 mg | ORAL_CAPSULE | Freq: Three times a day (TID) | ORAL | Status: DC

## 2013-04-28 NOTE — Progress Notes (Signed)
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 Radiographically the nodule is consistent with a new primary cancer vs a solitary metastasis.  His treated superior sulcus tumor appears controlled, although a paraspinal soft tissue mass in the scalene area may be larger.  He  complains of severe constant upper back, right shoulder, and right arm pain 10 on a scale of 0-10. Reports he stopped taking percocet 10/325 and morphine 15 mg "because they don't work."  The patient reports numbness and tingling in right arm.                   ALLERGIES:  has No Known Allergies.  Meds: Current Outpatient Prescriptions  Medication Sig Dispense Refill  . carbamazepine (TEGRETOL) 200 MG tablet take 3 tablets by mouth every morning 2 AT NOON and 3 tablets by mouth at bedtime  240 tablet  6  . efavirenz-emtricitabine-tenofovir (ATRIPLA) 600-200-300 MG per tablet Take 1 tablet by mouth at bedtime.  90 tablet  3  . morphine (MS CONTIN) 15 MG 12 hr tablet Take 1 tablet (15 mg total) by mouth every 12 (twelve) hours.  60 tablet  0  . oxyCODONE-acetaminophen (PERCOCET) 10-325 MG per tablet Take 1 tablet by mouth every 6 (six) hours as needed for pain.  40 tablet  0  . PHENobarbital (LUMINAL) 64.8 MG tablet Take 1 tablet (64.8 mg total) by mouth at bedtime.  90 tablet  2  .  ciprofloxacin (CIPRO) 500 MG tablet Take 1 tablet (500 mg total) by mouth 2 (two) times daily.  10 tablet  0  . gabapentin (NEURONTIN) 300 MG capsule Take 1 capsule (300 mg total) by mouth 3 (three) times daily.  90 capsule  5   No current facility-administered medications for this encounter.    Physical Findings: The patient is in no acute distress. Patient is alert and oriented.  height is 5\' 8"  (1.727 m) and weight is 133 lb 14.4 oz (60.737 kg). His oral temperature is 98.3 F (36.8 C). His blood pressure is 119/82 and his pulse is 101. His respiration is 18 and oxygen saturation is 98%. .  No significant changes.  Lab Findings: Lab Results  Component Value Date   WBC 5.9 04/06/2013   HGB 14.1 04/06/2013   HCT 40.6 04/06/2013   MCV 105.1* 04/06/2013   PLT 269 04/06/2013    @LASTCHEM @  Radiographic Findings: Ct Chest W Contrast  04/05/2013   CLINICAL DATA:  Lung cancer diagnosed in March 2014. Chemotherapy completed. Restaging exam.  EXAM: CT CHEST, ABDOMEN, AND PELVIS WITH CONTRAST  TECHNIQUE: Multidetector CT imaging of the chest, abdomen and pelvis was performed following the standard protocol during bolus administration of intravenous contrast.  CONTRAST:  165mL OMNIPAQUE IOHEXOL 300 MG/ML  SOLN  COMPARISON:  None.  CT 01/13/2013,  FINDINGS: CT CHEST FINDINGS  Right  lower lobe lesion has increased in size measuring 18 x 18 mm compared to 12 mm x 11 mm on 01/13/2013. Ill-defined branching nodular pattern in the left upper lobe (image 26) is unchanged from prior.  The right superior sulcus tumor measures 27 x 26 mm decreased from 32 x 32 mm on prior.  There is no axillary or supraclavicular lymphadenopathy. No mediastinal hilar lymphadenopathy. No pericardial fluid. Esophagus is normal.  CT ABDOMEN AND PELVIS FINDINGS  No focal hepatic lesion. Gallbladder, pancreas, and kidneys are normal. There is nodular enlargement of the right adrenal gland which has Hounsfield views consistent with  a benign adenoma. There is a low-density lesion in the spleen measuring 26 mm (image 62). This was not hypermetabolic on comparison PET-CT scan and appear to be present on earlier CT 06/24/2012.  Stomach, small bowel, cecum are normal. There is a moderate volume stool throughout the colon and rectum. No retroperitoneal periportal lymphadenopathy. No peritoneal disease. Review of bone windows demonstrates no aggressive osseous lesions.  IMPRESSION: 1. Size increase in right lower lobe pulmonary nodule consistent with bronchogenic carcinoma. 2. Continued decrease in size of right apical tumor. 3. Stable splenic lesions.  [name] this to be benign.   Electronically Signed   By: Suzy Bouchard M.D.   On: 04/05/2013 16:50   Ct Abdomen Pelvis W Contrast  04/05/2013   CLINICAL DATA:  Lung cancer diagnosed in March 2014. Chemotherapy completed. Restaging exam.  EXAM: CT CHEST, ABDOMEN, AND PELVIS WITH CONTRAST  TECHNIQUE: Multidetector CT imaging of the chest, abdomen and pelvis was performed following the standard protocol during bolus administration of intravenous contrast.  CONTRAST:  122mL OMNIPAQUE IOHEXOL 300 MG/ML  SOLN  COMPARISON:  None.  CT 01/13/2013,  FINDINGS: CT CHEST FINDINGS  Right lower lobe lesion has increased in size measuring 18 x 18 mm compared to 12 mm x 11 mm on 01/13/2013. Ill-defined branching nodular pattern in the left upper lobe (image 26) is unchanged from prior.  The right superior sulcus tumor measures 27 x 26 mm decreased from 32 x 32 mm on prior.  There is no axillary or supraclavicular lymphadenopathy. No mediastinal hilar lymphadenopathy. No pericardial fluid. Esophagus is normal.  CT ABDOMEN AND PELVIS FINDINGS  No focal hepatic lesion. Gallbladder, pancreas, and kidneys are normal. There is nodular enlargement of the right adrenal gland which has Hounsfield views consistent with a benign adenoma. There is a low-density lesion in the spleen measuring 26 mm (image 62). This was not  hypermetabolic on comparison PET-CT scan and appear to be present on earlier CT 06/24/2012.  Stomach, small bowel, cecum are normal. There is a moderate volume stool throughout the colon and rectum. No retroperitoneal periportal lymphadenopathy. No peritoneal disease. Review of bone windows demonstrates no aggressive osseous lesions.  IMPRESSION: 1. Size increase in right lower lobe pulmonary nodule consistent with bronchogenic carcinoma. 2. Continued decrease in size of right apical tumor. 3. Stable splenic lesions.  [name] this to be benign.   Electronically Signed   By: Suzy Bouchard M.D.   On: 04/05/2013 16:50    Impression:  The patient has a small enlarging asymptomatic right lower lung nodule.  But, his primary complaint is severe right shoulder pain radiating to the right arm and hand.  The symptoms seem to have a neuropathic quality and could represent tumor recurrence in the right paraspinal region versus post-radiation brachial plexopathy.  At a low final dose level of 45 Gy, tumor recurrence is significantly more likely than plexopathy.  Given the CT appearance of a possible scalene soft tissue mass, I suspect MRI of the right shoulder/neck would help further elucidate a possible etiology for his pain.  Plan:  I have ordered MRI of the shoulder, and Neurontin 300 mg TID, and will see him after MRI to discuss and proceed with possible radiotherapy.  _____________________________________  Sheral Apley Tammi Klippel, M.D.

## 2013-04-28 NOTE — Progress Notes (Signed)
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 Will contact social work Estate agent. Denies dysphagia or shortness of breath. Reports a productive cough with white sputum that has increased in frequency. Weighed 141 on 12/16 but, today weighs 133. Denies hemoptysis. Complains of severe constant upper back, right shoulder, and right arm pain 10 on a scale of 0-10. Reports he stopped taking percocet 10/325 and morphine 15 mg "because they don't work." No edema of right arm noted but, patient reports numbness and tingling in right arm. Adrena Johnson's note reads the patient's PCP is to prescribe pain medication. Patient scheduled to see Dr. [name], PCP next Wednesday. Reports night sweats. Denies headache, nausea or vomiting. Reports dizziness upon standing. Reports difficulty sleeping due to pain.

## 2013-04-28 NOTE — Progress Notes (Signed)
See progress note under physician encounter. 

## 2013-04-28 NOTE — Progress Notes (Signed)
Patient seen by Roger Walton reference re applying for Medicaid. Also, patient seen by [name] reference transportation issues. MRI of right shoulder scheduled for 1/14 and simulation for 1/16.

## 2013-05-05 ENCOUNTER — Encounter: Payer: Self-pay | Admitting: Infectious Diseases

## 2013-05-05 ENCOUNTER — Ambulatory Visit (INDEPENDENT_AMBULATORY_CARE_PROVIDER_SITE_OTHER): Payer: Medicare Other | Admitting: Infectious Diseases

## 2013-05-05 VITALS — BP 122/83 | HR 109 | Temp 98.6°F | Wt 135.0 lb

## 2013-05-05 DIAGNOSIS — B20 Human immunodeficiency virus [HIV] disease: Secondary | ICD-10-CM

## 2013-05-05 DIAGNOSIS — L0293 Carbuncle, unspecified: Secondary | ICD-10-CM

## 2013-05-05 DIAGNOSIS — L0292 Furuncle, unspecified: Secondary | ICD-10-CM

## 2013-05-05 DIAGNOSIS — C3410 Malignant neoplasm of upper lobe, unspecified bronchus or lung: Secondary | ICD-10-CM

## 2013-05-05 LAB — COMPREHENSIVE METABOLIC PANEL
ALBUMIN: 4 g/dL (ref 3.5–5.2)
ALT: 14 U/L (ref 0–53)
AST: 21 U/L (ref 0–37)
Alkaline Phosphatase: 101 U/L (ref 39–117)
BILIRUBIN TOTAL: 0.4 mg/dL (ref 0.3–1.2)
BUN: 17 mg/dL (ref 6–23)
CO2: 32 meq/L (ref 19–32)
Calcium: 10.1 mg/dL (ref 8.4–10.5)
Chloride: 101 mEq/L (ref 96–112)
Creat: 0.95 mg/dL (ref 0.50–1.35)
GLUCOSE: 100 mg/dL — AB (ref 70–99)
POTASSIUM: 5.1 meq/L (ref 3.5–5.3)
SODIUM: 141 meq/L (ref 135–145)
TOTAL PROTEIN: 8.2 g/dL (ref 6.0–8.3)

## 2013-05-05 LAB — CBC
HEMATOCRIT: 42.3 % (ref 39.0–52.0)
Hemoglobin: 14.8 g/dL (ref 13.0–17.0)
MCH: 34.7 pg — ABNORMAL HIGH (ref 26.0–34.0)
MCHC: 35 g/dL (ref 30.0–36.0)
MCV: 99.3 fL (ref 78.0–100.0)
Platelets: 362 10*3/uL (ref 150–400)
RBC: 4.26 MIL/uL (ref 4.22–5.81)
RDW: 13.6 % (ref 11.5–15.5)
WBC: 3.8 10*3/uL — AB (ref 4.0–10.5)

## 2013-05-05 MED ORDER — SULFAMETHOXAZOLE-TMP DS 800-160 MG PO TABS: 1.0000 | ORAL_TABLET | Freq: Two times a day (BID) | ORAL | Status: DC

## 2013-05-05 MED ORDER — DRONABINOL 10 MG PO CAPS: 10.0000 mg | ORAL_CAPSULE | Freq: Two times a day (BID) | ORAL | Status: DC

## 2013-05-05 MED ORDER — OXYCODONE-ACETAMINOPHEN 10-325 MG PO TABS: 1.0000 | ORAL_TABLET | Freq: Four times a day (QID) | ORAL | Status: DC | PRN

## 2013-05-05 NOTE — Assessment & Plan Note (Signed)
I am worried about his pain. Will refill his pain rx, have him seen by palliative care. He will have MRI tomorrow.

## 2013-05-05 NOTE — Assessment & Plan Note (Addendum)
Gets flu shot today. Will recheck his CD4 and HIV RNA, routine labs. I am concerned about his long term prognosis. Needs palliative care eval. rtc 2 months, will start bactrim if CD4 still low (now off CTX).

## 2013-05-05 NOTE — Assessment & Plan Note (Signed)
Will give him rx with bactrim.

## 2013-05-05 NOTE — Progress Notes (Signed)
   Subjective:    Patient ID: Roger Walton, male    DOB: [birth year], 58 y.o.   MRN: [id number]  Shoulder Pain    58 yo M with HIV+ (on atripla, previous PI mutations), Stage IIB/IIIA non-small cell lung cancer, squamous cell carcinoma presenting as a right Pancoast tumor January 2014. Has been receiving concurrent chemoradiation with weekly carboplatin and paclitaxol. Today complains of pain in his neck and shoulders as well as R arm into his fingers. Has lost strength in his R arm as well. Has been worsening over 4 months. Has been getting oxycodone, neurontin for pain.  Is scheduled for MRI tomorrow.  Is off CTX and XRT- for several months.  Had repeat CT scan 04-05-13: 1. Size increase in right lower lobe pulmonary nodule consistent with bronchogenic carcinoma.  2. Continued decrease in size of right apical tumor.  3. Stable splenic lesions. Roger Walton this to be benign.  No problems with ART.  HIV 1 RNA Quant (copies/mL)  Date Value  12/09/2012 <20   06/29/2012 54*  02/19/2012 90*     CD4 T Cell Abs (/uL)  Date Value  12/09/2012 120*  06/29/2012 160*  02/19/2012 120*   has noticed cysts, knots on his skin.   Review of Systems  Constitutional: Positive for appetite change and unexpected weight change.  Musculoskeletal: Positive for arthralgias and myalgias.       Objective:   Physical Exam  Constitutional: He appears cachectic.  HENT:  Mouth/Throat: No oropharyngeal exudate.  Eyes: EOM are normal. Pupils are equal, round, and reactive to light.  Neck: Neck supple.  Cardiovascular: Normal rate, regular rhythm and normal heart sounds.   Pulmonary/Chest: Effort normal and breath sounds normal.  Abdominal: Soft. Bowel sounds are normal. He exhibits no distension. There is no tenderness.  Musculoskeletal:       Arms: Lymphadenopathy:    He has no cervical adenopathy.  Skin:             Assessment & Plan:

## 2013-05-06 ENCOUNTER — Ambulatory Visit (HOSPITAL_COMMUNITY)
Admission: RE | Admit: 2013-05-06 | Discharge: 2013-05-06 | Disposition: A | Discharge status: Home / Self Care | Payer: Medicare Other | Source: Ambulatory Visit | Attending: Radiation Oncology | Admitting: Radiation Oncology

## 2013-05-06 ENCOUNTER — Other Ambulatory Visit: Payer: Self-pay | Admitting: Radiation Oncology

## 2013-05-06 DIAGNOSIS — C3411 Malignant neoplasm of upper lobe, right bronchus or lung: Secondary | ICD-10-CM

## 2013-05-06 DIAGNOSIS — C7951 Secondary malignant neoplasm of bone: Secondary | ICD-10-CM | POA: Insufficient documentation

## 2013-05-06 DIAGNOSIS — M25511 Pain in right shoulder: Secondary | ICD-10-CM

## 2013-05-06 DIAGNOSIS — C770 Secondary and unspecified malignant neoplasm of lymph nodes of head, face and neck: Secondary | ICD-10-CM

## 2013-05-06 DIAGNOSIS — C3410 Malignant neoplasm of upper lobe, unspecified bronchus or lung: Secondary | ICD-10-CM | POA: Insufficient documentation

## 2013-05-06 DIAGNOSIS — M25519 Pain in unspecified shoulder: Secondary | ICD-10-CM | POA: Insufficient documentation

## 2013-05-06 DIAGNOSIS — C7952 Secondary malignant neoplasm of bone marrow: Secondary | ICD-10-CM

## 2013-05-06 DIAGNOSIS — C7949 Secondary malignant neoplasm of other parts of nervous system: Secondary | ICD-10-CM | POA: Insufficient documentation

## 2013-05-06 DIAGNOSIS — M79609 Pain in unspecified limb: Secondary | ICD-10-CM | POA: Insufficient documentation

## 2013-05-06 DIAGNOSIS — C50919 Malignant neoplasm of unspecified site of unspecified female breast: Secondary | ICD-10-CM | POA: Insufficient documentation

## 2013-05-06 DIAGNOSIS — C779 Secondary and unspecified malignant neoplasm of lymph node, unspecified: Secondary | ICD-10-CM

## 2013-05-06 MED ORDER — GADOBENATE DIMEGLUMINE 529 MG/ML IV SOLN
13.0000 mL | Freq: Once | INTRAVENOUS | Status: AC | PRN
  Administered 2013-05-06: 13 mL via INTRAVENOUS

## 2013-05-06 NOTE — Progress Notes (Signed)
Spoke with dr Zigmund Daniel ok to administer contrast with pt motion. Pt in too much pain to hold still for exam.

## 2013-05-06 NOTE — Progress Notes (Signed)
Spoke with dr Zigmund Daniel radiologist. Per Rad scan mr Soft tissue neck for exam reasoning. Per order to assess scalene node we need to scan a soft tissue neck including the pancoast tumor, spoke with dr Zigmund Daniel at 8:00am on 05/06/13 before pt was placed on to table. Pt in severe pain unable to hold still. Scanning best possible images for patient condition. cap

## 2013-05-07 ENCOUNTER — Other Ambulatory Visit: Payer: Self-pay | Admitting: *Deleted

## 2013-05-07 ENCOUNTER — Telehealth: Payer: Self-pay | Admitting: *Deleted

## 2013-05-07 ENCOUNTER — Ambulatory Visit
Admission: RE | Admit: 2013-05-07 | Discharge: 2013-05-07 | Disposition: A | Discharge status: Home / Self Care | Payer: Medicare Other | Source: Ambulatory Visit | Attending: Radiation Oncology | Admitting: Radiation Oncology

## 2013-05-07 DIAGNOSIS — Z510 Encounter for antineoplastic radiation therapy: Secondary | ICD-10-CM | POA: Insufficient documentation

## 2013-05-07 DIAGNOSIS — C3490 Malignant neoplasm of unspecified part of unspecified bronchus or lung: Secondary | ICD-10-CM | POA: Insufficient documentation

## 2013-05-07 DIAGNOSIS — C7952 Secondary malignant neoplasm of bone marrow: Secondary | ICD-10-CM

## 2013-05-07 DIAGNOSIS — C7951 Secondary malignant neoplasm of bone: Secondary | ICD-10-CM | POA: Insufficient documentation

## 2013-05-07 DIAGNOSIS — C3410 Malignant neoplasm of upper lobe, unspecified bronchus or lung: Secondary | ICD-10-CM

## 2013-05-07 DIAGNOSIS — B20 Human immunodeficiency virus [HIV] disease: Secondary | ICD-10-CM

## 2013-05-07 DIAGNOSIS — C7800 Secondary malignant neoplasm of unspecified lung: Secondary | ICD-10-CM | POA: Insufficient documentation

## 2013-05-07 DIAGNOSIS — L0292 Furuncle, unspecified: Secondary | ICD-10-CM

## 2013-05-07 LAB — T-HELPER CELL (CD4) - (RCID CLINIC ONLY)
CD4 T CELL HELPER: 12 % — AB (ref 33–55)
CD4 T Cell Abs: 100 /uL — ABNORMAL LOW (ref 400–2700)

## 2013-05-07 MED ORDER — MORPHINE SULFATE 4 MG/ML IJ SOLN
4.0000 mg | Freq: Once | INTRAMUSCULAR | Status: AC
  Administered 2013-05-07: 4 mg via INTRAMUSCULAR
  Filled 2013-05-07: qty 1

## 2013-05-07 MED ORDER — DEXAMETHASONE 4 MG PO TABS: 4.0000 mg | ORAL_TABLET | ORAL | Status: DC

## 2013-05-07 MED ORDER — SULFAMETHOXAZOLE-TMP DS 800-160 MG PO TABS: 1.0000 | ORAL_TABLET | ORAL | Status: DC

## 2013-05-07 NOTE — Progress Notes (Signed)
  Radiation Oncology         [phone] ________________________________  Name: Roger Walton MRN: [id number]  Date: 05/07/2013  DOB: April 09, 1956  STEREOTACTIC BODY RADIOTHERAPY SIMULATION AND TREATMENT PLANNING NOTE  DIAGNOSIS:  58 year old man with a C7 spinal invading primary lung cancer in a previously irradiated field with progression, severe pain, and neurologic symptoms in addition to an solitary lung oligometastasis  NARRATIVE:  The patient was brought to the [place].  Identity was confirmed.  All relevant records and images related to the planned course of therapy were reviewed.  The patient freely provided informed written consent to proceed with treatment after reviewing the details related to the planned course of therapy. The consent form was witnessed and verified by the simulation staff.  Then, the patient was set-up in a stable reproducible  supine position for radiation therapy.  A BodyFix immobilization pillow was fabricated for reproducible positioning.  At this point, the patient was overwhelmed with pain and rescheduled for simulation on 1/19.  In the interim, we will begin dexamethasone to see if this helps with his pain to tolerate positioning and treatment.  ________________________________  Sheral Apley. Tammi Klippel, M.D.

## 2013-05-07 NOTE — Telephone Encounter (Signed)
Called third party information listed on PA form.  Ph: [phone].  Pt ID: [id number].   PA initiated 1/16.  Should have answer within 72 hours.  Reference: order # [id number]. Landis Gandy, RN

## 2013-05-10 ENCOUNTER — Other Ambulatory Visit: Payer: Self-pay | Admitting: Radiation Oncology

## 2013-05-10 ENCOUNTER — Encounter: Payer: Self-pay | Admitting: Radiation Oncology

## 2013-05-10 ENCOUNTER — Ambulatory Visit
Admission: RE | Admit: 2013-05-10 | Discharge: 2013-05-10 | Disposition: A | Discharge status: Home / Self Care | Payer: Medicare Other | Source: Ambulatory Visit | Attending: Radiation Oncology | Admitting: Radiation Oncology

## 2013-05-10 DIAGNOSIS — C7931 Secondary malignant neoplasm of brain: Secondary | ICD-10-CM

## 2013-05-10 DIAGNOSIS — C3410 Malignant neoplasm of upper lobe, unspecified bronchus or lung: Secondary | ICD-10-CM

## 2013-05-10 DIAGNOSIS — C7949 Secondary malignant neoplasm of other parts of nervous system: Secondary | ICD-10-CM

## 2013-05-10 LAB — HIV-1 RNA QUANT-NO REFLEX-BLD
HIV 1 RNA Quant: 82 copies/mL — ABNORMAL HIGH (ref <20)
HIV-1 RNA QUANT, LOG: 1.91 {Log} — AB (ref <1.30)

## 2013-05-10 MED ORDER — DRONABINOL 10 MG PO CAPS: 10.0000 mg | ORAL_CAPSULE | Freq: Two times a day (BID) | ORAL | Status: DC

## 2013-05-10 NOTE — Telephone Encounter (Addendum)
Medication approved 05/07/13 - 05/07/14.  Prescription called in to [facility] on [address].  Left patient a message informing him. Landis Gandy, RN

## 2013-05-10 NOTE — Progress Notes (Signed)
  Radiation Oncology         [phone] ________________________________  Name: Roger Walton MRN: [id number]  Date: 05/10/2013  DOB: 09-16-1955  STEREOTACTIC BODY RADIOTHERAPY SIMULATION AND TREATMENT PLANNING NOTE  DIAGNOSIS:  58 year old man with a C7 spinal invading primary lung cancer in a previously irradiated field with progression, severe pain, and neurologic symptoms in addition to an solitary lung oligometastasis  NARRATIVE:  The patient was brought to the [place].  Identity was confirmed.  All relevant records and images related to the planned course of therapy were reviewed.  He was returned to his  BodyFix immobilization pillow which was fabricated on 05/07/13 for reproducible positioning.  Surface markings were placed.  The CT images were loaded into the planning software and fused with MRI.  The gross target volumes (GTV) and planning target volumes (PTV) were delineated, and avoidance structures were contoured.  Treatment planning then occurred.  The radiation prescription was entered and confirmed.  A total of two complex treatment devices were fabricated in the form of the BodyFix immobilization pillow and a neck accuform cushion.  I have requested : 3D Simulation  I have requested a DVH of the following structures: targets and all normal structures near the target as outlined on the radiation plan to maintain doses in adherence with established limits  RESPIRATORY MOTION MANAGEMENT:  In order to account for effect of respiratory motion on the lung target structure and other organs in the planning and delivery of the lung SBRT radiotherapy, this patient underwent respiratory motion management simulation.  To accomplish this, when the patient was brought to the CT simulation planning suite, 4D respiratoy motion management CT images were obtained.  The CT images were loaded into the planning software.  Then, using a variety of tools including Cine, MIP, and standard  views, the target volume and planning target volumes (PTV) were delineated.  Avoidance structures were contoured.  Treatment planning then occurred.  Dose volume histograms were generated and reviewed for each of the requested structure.  The resulting plan was carefully reviewed and approved today.  PLAN:  The patient will receive 50 Gy in 5 fractions to the spinal lesion and lung lesion.  ------------------------------------------  Sheral Apley. Tammi Klippel, M.D.

## 2013-05-10 NOTE — Addendum Note (Signed)
Addended by: Landis Gandy on: 05/10/2013 04:12 PM   Modules accepted: Orders

## 2013-05-10 NOTE — Progress Notes (Signed)
Patient and wife stopped by to get help completing the MCD application.  Based on patient's income, qualifies for [facility] funds.  Completed application and forward to Aleene Davidson for processing.

## 2013-05-14 ENCOUNTER — Encounter: Payer: Self-pay | Admitting: Radiation Oncology

## 2013-05-14 ENCOUNTER — Ambulatory Visit
Admission: RE | Admit: 2013-05-14 | Discharge: 2013-05-14 | Disposition: A | Discharge status: Home / Self Care | Payer: Medicare Other | Source: Ambulatory Visit | Attending: Radiation Oncology | Admitting: Radiation Oncology

## 2013-05-14 VITALS — BP 94/73 | HR 88 | Temp 98.9°F | Resp 20

## 2013-05-14 DIAGNOSIS — C7931 Secondary malignant neoplasm of brain: Secondary | ICD-10-CM

## 2013-05-14 DIAGNOSIS — C7949 Secondary malignant neoplasm of other parts of nervous system: Secondary | ICD-10-CM

## 2013-05-14 DIAGNOSIS — C3410 Malignant neoplasm of upper lobe, unspecified bronchus or lung: Secondary | ICD-10-CM

## 2013-05-14 NOTE — Progress Notes (Signed)
S/p SRS c_7, no c/o pain in neck, c/o pain back of left leg, joking with  Male family member,, no nausea, or blurred vision, will monitor 30 minutes, no fever 5:23 PM

## 2013-05-14 NOTE — Progress Notes (Signed)
  Radiation Oncology         [phone] ________________________________  Spinal Stereotactic Radiosurgery Procedure Note  Name: Roger Walton MRN: [id number]  Date: 05/14/2013  DOB: 06-03-1955  SPECIAL TREATMENT PROCEDURE  Current Fraction:    1  Planned Fractions:  5  3D TREATMENT PLANNING AND DOSIMETRY:  The patient's radiation plan was reviewed and approved by neurosurgery and radiation oncology prior to treatment.  It showed 3-dimensional radiation distributions overlaid onto the planning CT/MRI image set.  The [facility] for the target structures as well as the organs at risk were reviewed. The documentation of the 3D plan and dosimetry are filed in the radiation oncology EMR.  NARRATIVE:  Roger Walton was brought to the TrueBeam stereotactic radiation treatment machine and placed supine on the CT couch. The patient was precisely re-positioned in their BodyFix immobilization device, and the patient was set up for stereotactic radiosurgery.  Neurosurgery was present for the set-up and delivery  SIMULATION VERIFICATION:  In the couch zero-angle position, the patient underwent Exactrac imaging using the Brainlab system with orthogonal KV images to position the target accounting for translation and rotational factors.  These were carefully aligned and repeated to confirm treatment position.  Then, cone beam CT was performed to help further verify placement and make any final translational shifts.  SPECIAL TREATMENT PROCEDURE: Roger Walton received stereotactic radiosurgery to the following targets: The targeted metastasis in the C7 vertebral body was treated using 4 Rapid Arc VMAT Beams to a fractional dose of 10 Gy out of 5 fractions to a total prescription dose of 50 Gy.  STEREOTACTIC TREATMENT MANAGEMENT:  Following delivery, the patient was transported to nursing in stable condition and monitored for possible acute effects.  Vital signs were recorded BP 94/73  Pulse 88  Temp(Src) 98.9 F  (37.2 C) (Oral)  Resp 20. The patient tolerated treatment without significant acute effects, and was discharged to home in stable condition.    PLAN: Continue for five fractions and then follow-up in one month.  ________________________________  Sheral Apley. Tammi Klippel, M.D.

## 2013-05-14 NOTE — Op Note (Signed)
   Name: Roger Walton  MRN: [id number]  Date: 05/14/2013   DOB: 08-19-1955  Stereotactic Radiosurgery Operative Note  PRE-OPERATIVE DIAGNOSIS:  Spinal Metastasis  POST-OPERATIVE DIAGNOSIS:  Spinal Metastasis  PROCEDURE:  Stereotactic Radiosurgery  SURGEON:  Peggyann Shoals, MD  NARRATIVE: The patient underwent a radiation treatment planning session in the radiation oncology simulation suite under the care of the radiation oncology physician and physicist.  I participated closely in the radiation treatment planning afterwards. The patient underwent planning CT myelogram which was fused to the MRI.  These images were fused on the planning system.  Radiation oncology contoured the gross target volume and subsequently expanded this to yield the Planning Target Volume. I actively participated in the planning process.  I helped to define and review the target contours and also the contours of the spinal cord, and selected nearby organs at risk.  All the dose constraints for critical structures were reviewed and compared to AAPM Task Group 101.  The prescription dose conformity was reviewed.  I approved the plan electronically.    Accordingly, Roger Walton was brought to the TrueBeam stereotactic radiation treatment linac and placed in the custom immobilization device.  The patient was aligned according to the IR fiducial markers with BrainLab Exactrac, then orthogonal x-rays were used in ExacTrac with the 6DOF robotic table and the shifts were made to align the patient.  Then conebeam CT was performed to verify precision.  Roger Walton received stereotactic radiosurgery uneventfully.  The detailed description of the procedure is recorded in the radiation oncology procedure note.  I was present for the duration of the procedure.  DISPOSITION:  Following delivery, the patient was transported to nursing in stable condition and monitored for possible acute effects to be discharged to home in stable  condition with follow-up in one month.  Peggyann Shoals, MD 05/14/2013 5:19 PM

## 2013-05-17 ENCOUNTER — Ambulatory Visit
Admission: RE | Admit: 2013-05-17 | Discharge: 2013-05-17 | Disposition: A | Discharge status: Home / Self Care | Payer: Medicare Other | Source: Ambulatory Visit | Attending: Radiation Oncology | Admitting: Radiation Oncology

## 2013-05-17 VITALS — BP 120/85 | HR 86 | Temp 97.7°F | Resp 20

## 2013-05-17 DIAGNOSIS — C7931 Secondary malignant neoplasm of brain: Secondary | ICD-10-CM

## 2013-05-17 DIAGNOSIS — C3410 Malignant neoplasm of upper lobe, unspecified bronchus or lung: Secondary | ICD-10-CM

## 2013-05-17 DIAGNOSIS — C7949 Secondary malignant neoplasm of other parts of nervous system: Secondary | ICD-10-CM

## 2013-05-17 MED ORDER — OXYCODONE-ACETAMINOPHEN 10-325 MG PO TABS: 1.0000 | ORAL_TABLET | Freq: Four times a day (QID) | ORAL | Status: DC | PRN

## 2013-05-17 MED ORDER — DEXAMETHASONE 4 MG PO TABS: 4.0000 mg | ORAL_TABLET | ORAL | Status: DC

## 2013-05-17 NOTE — Progress Notes (Signed)
After speaking with wife, she was giving the patient 2 tabs 4mg  decadron bid, didn't taper to 1 tab bid, new rx given of dexamethasone to take 4mg  (1tab) 3x day (q 8hours) for 5 days and then to take 1 tab 4mg  2x day , wrote out directions for the wife and went over this 3 x, finally verbal understanding, also rx for oxycodone refilled, asked patient wife to call me for any concerns or questions about his meds or if fever, nausea, dizziness occur, patient encouraged to eat a meal  And to rest the rest of today, patient stated he would, wife taking patient in w/c to car 1:45 PM

## 2013-05-17 NOTE — Progress Notes (Signed)
Vitals checked again, ptient  Pain in right shoulder and fingers stated of right hand, request refill on dexamethasone and oxycodoene/apap 10/325mg  , "i'm higher than a kite", did eat graham crackers and p nut butter, , and drank cola, still c/o of left thigh back of leg pain, hot water helps it 1:29 PM  joking and laughing, no blurred vision or

## 2013-05-17 NOTE — Progress Notes (Signed)
Pt brought from treatment area in wheelchair. Pt in rm 5 following SRS to C spine. Pt c/o " I feel woozy." Pt states he did not eat lunch. Pt denies pain, specifically pain in his neck, denies HA, nausea, other issues, problems. Wife with pt. Showed pt and wife the 2 call bells on wall near door. Advised them pt must remain in nursing x 30 min and will have his VS taken again before he leaves. Wife and pt verbalized understanding.

## 2013-05-18 ENCOUNTER — Telehealth: Payer: Self-pay

## 2013-05-18 NOTE — Progress Notes (Signed)
  Radiation Oncology [phone]  ________________________________  Spinal Stereotactic Radiosurgery Procedure Note  Name: Roger Walton MRN: [id number]  Date: 05/14/2013 DOB: Jun 28, 1955  SPECIAL TREATMENT PROCEDURE   Current Fraction: 2  Planned Fractions: 5   3D TREATMENT PLANNING AND DOSIMETRY: The patient's radiation plan was reviewed and approved by neurosurgery and radiation oncology prior to treatment. It showed 3-dimensional radiation distributions overlaid onto the planning CT/MRI image set. The [facility] for the target structures as well as the organs at risk were reviewed. The documentation of the 3D plan and dosimetry are filed in the radiation oncology EMR.   NARRATIVE: Roger Walton was brought to the TrueBeam stereotactic radiation treatment machine and placed supine on the CT couch. The patient was precisely re-positioned in their BodyFix immobilization device, and the patient was set up for stereotactic radiosurgery.   SIMULATION VERIFICATION: In the couch zero-angle position, the patient underwent Exactrac imaging using the Brainlab system with orthogonal KV images to position the target accounting for translation and rotational factors. These were carefully aligned and repeated to confirm treatment position. Then, cone beam CT was performed to help further verify placement and make any final translational shifts.   SPECIAL TREATMENT PROCEDURE: Roger Walton received stereotactic radiosurgery to the following targets:  The targeted metastasis in the C7 vertebral body was treated using 4 Rapid Arc VMAT Beams to a fractional dose of 10 Gy out of 5 fractions to a total prescription dose of 50 Gy.   STEREOTACTIC TREATMENT MANAGEMENT: Following delivery, the patient was transported to nursing in stable condition and monitored for possible acute effects. Vital signs were recorded BP 94/73  Pulse 88  Temp(Src) 98.9 F (37.2 C) (Oral)  Resp 20. The patient tolerated treatment without  significant acute effects, and was discharged to home in stable condition.   PLAN: Continue for five fractions and then follow-up in one month.

## 2013-05-18 NOTE — Telephone Encounter (Signed)
[facility]  Transportation arranged thru [place] for 05/19/13, 05/21/13 and 05/24/13.Pick up times 10 to 10:30 am from home address and pick up to return home between 1:30 and 2:00 pm.Informed wife [name] of appointments.[name] states she doesn't know if they can come as she doesn't have $1.50 to pay.Asked her to ask someone for it as very important that he has treatment, and if they can at least get here I would be more than glad to arrange for payment.

## 2013-05-19 ENCOUNTER — Telehealth: Payer: Self-pay

## 2013-05-19 ENCOUNTER — Telehealth: Payer: Self-pay | Admitting: Radiation Oncology

## 2013-05-19 ENCOUNTER — Ambulatory Visit: Payer: Medicare Other | Admitting: Radiation Oncology

## 2013-05-19 NOTE — Telephone Encounter (Signed)
Phoned patient at home. Spoke with his wife. She explains her husband was "up all night throwing up and doesn't feel up to coming in today" for [facility] treatment. She confirmed he has an antiemetic to take. Reminded her of his [facility] appointment for Friday. Explained there is money at [place] [facility] to cover [facility]. She verbalized understanding. Informed Dr. Tammi Klippel, [place], and [place], RT for [place] [facility] of this finding.

## 2013-05-19 NOTE — Telephone Encounter (Signed)
Reservation made for patient pick up by [facility] for Wednesday May 26, 2013.Pick up time 12:15 to 12:45 for 1:30 pm treat and leave [place] at 3:30 to 4:00 pm.Patient wife, Olin Hauser has been informed.Cash for transportation is locked up by [place] , first floor registration.

## 2013-05-21 ENCOUNTER — Telehealth: Payer: Self-pay | Admitting: Radiation Oncology

## 2013-05-21 ENCOUNTER — Ambulatory Visit
Admission: RE | Admit: 2013-05-21 | Discharge: 2013-05-21 | Disposition: A | Discharge status: Home / Self Care | Payer: Medicare Other | Source: Ambulatory Visit | Attending: Radiation Oncology | Admitting: Radiation Oncology

## 2013-05-21 DIAGNOSIS — C3410 Malignant neoplasm of upper lobe, unspecified bronchus or lung: Secondary | ICD-10-CM

## 2013-05-21 NOTE — Addendum Note (Signed)
Encounter addended by: Erline Levine, MD on: 05/21/2013  2:07 PM<BR>     Documentation filed: Clinical Notes

## 2013-05-21 NOTE — Telephone Encounter (Signed)
Phoned patient. He plans to come in for treatment today.

## 2013-05-23 NOTE — Progress Notes (Signed)
  Radiation Oncology         [phone] ________________________________  Name: CLIDE REMMERS MRN: [id number]  Date: 05/21/2013  DOB: [birth year]  Stereotactic Body Radiotherapy Treatment Procedure Note  CURRENT FRACTION:    4  PLANNED FRACTIONS:  5  NARRATIVE:  Roger Walton was brought to the stereotactic radiation treatment machine and placed supine on the CT couch. The patient was set up for stereotactic body radiotherapy on the body fix pillow.  3D TREATMENT PLANNING AND DOSIMETRY:  The patient's radiation plan was reviewed and approved prior to starting treatment.  It showed 3-dimensional radiation distributions overlaid onto the planning CT.  The [facility] for the target structures as well as the organs at risk were reviewed. The documentation of this is filed in the radiation oncology EMR.  SIMULATION VERIFICATION:  The patient underwent CT imaging on the treatment unit.  These were carefully aligned to document that the ablative radiation dose would cover the target volume and maximally spare the nearby organs at risk according to the planned distribution.  SPECIAL TREATMENT PROCEDURE: LEVEN HOEL received high dose ablative stereotactic body radiotherapy to the planned target volume without unforeseen complications. Treatment was delivered uneventfully. The high doses associated with stereotactic body radiotherapy and the significant potential risks require careful treatment set up and patient monitoring constituting a special treatment procedure   STEREOTACTIC TREATMENT MANAGEMENT:  Following delivery, the patient was evaluated clinically. The patient tolerated treatment without significant acute effects, and was discharged to home in stable condition.    PLAN: Continue treatment as planned.  ________________________________  Sheral Apley. Tammi Klippel, M.D.

## 2013-05-24 ENCOUNTER — Ambulatory Visit: Admission: RE | Admit: 2013-05-24 | Payer: Medicare Other | Source: Ambulatory Visit | Admitting: Radiation Oncology

## 2013-05-24 ENCOUNTER — Telehealth: Payer: Self-pay | Admitting: Radiation Oncology

## 2013-05-24 NOTE — Telephone Encounter (Addendum)
Informed by Marcine Matar that patient cancelled [facility] treatment for today. Phoned residence and spoke with patient's wife. She reports that her husband doesn't feel well today. She reports he vomited twice over the weekend but, hasn't today. She explains that she "gave him the pill today to keep him from being nausea." She reports that he complains his throat is sore and it's difficult to swallow x 2 days. Denies that he has a fever. Reports that he plans to be present for treatment on Wednesday. Denies needs at this time. [name] confirms she contacted [facility] to make them aware they don't need pick up today.

## 2013-05-26 ENCOUNTER — Ambulatory Visit
Admission: RE | Admit: 2013-05-26 | Discharge: 2013-05-26 | Disposition: A | Discharge status: Home / Self Care | Payer: Medicare Other | Source: Ambulatory Visit | Attending: Radiation Oncology | Admitting: Radiation Oncology

## 2013-05-26 DIAGNOSIS — C3410 Malignant neoplasm of upper lobe, unspecified bronchus or lung: Secondary | ICD-10-CM

## 2013-05-28 ENCOUNTER — Ambulatory Visit
Admission: RE | Admit: 2013-05-28 | Discharge: 2013-05-28 | Disposition: A | Discharge status: Home / Self Care | Payer: Medicare Other | Source: Ambulatory Visit | Attending: Radiation Oncology | Admitting: Radiation Oncology

## 2013-05-28 ENCOUNTER — Encounter: Payer: Self-pay | Admitting: Radiation Oncology

## 2013-05-28 DIAGNOSIS — C7931 Secondary malignant neoplasm of brain: Secondary | ICD-10-CM

## 2013-05-28 DIAGNOSIS — C7949 Secondary malignant neoplasm of other parts of nervous system: Secondary | ICD-10-CM

## 2013-05-28 DIAGNOSIS — C3410 Malignant neoplasm of upper lobe, unspecified bronchus or lung: Secondary | ICD-10-CM

## 2013-05-28 NOTE — Progress Notes (Signed)
  Radiation Oncology         [phone] ________________________________  Name: Roger Walton MRN: [id number]  Date: 05/26/2013  DOB: [birth year]  Stereotactic Body Radiotherapy Treatment Procedure Note  CURRENT FRACTION:    4  PLANNED FRACTIONS:  5  NARRATIVE:  Roger Walton was brought to the stereotactic radiation treatment machine and placed supine on the CT couch. The patient was set up for stereotactic body radiotherapy on the body fix pillow.  3D TREATMENT PLANNING AND DOSIMETRY:  The patient's radiation plan was reviewed and approved prior to starting treatment.  It showed 3-dimensional radiation distributions overlaid onto the planning CT.  The [facility] for the target structures as well as the organs at risk were reviewed. The documentation of this is filed in the radiation oncology EMR.  SIMULATION VERIFICATION:  The patient underwent CT imaging on the treatment unit.  These were carefully aligned to document that the ablative radiation dose would cover the target volume and maximally spare the nearby organs at risk according to the planned distribution.  SPECIAL TREATMENT PROCEDURE: Roger Walton received high dose ablative stereotactic body radiotherapy to the planned target volume without unforeseen complications. Treatment was delivered uneventfully. The high doses associated with stereotactic body radiotherapy and the significant potential risks require careful treatment set up and patient monitoring constituting a special treatment procedure   STEREOTACTIC TREATMENT MANAGEMENT:  Following delivery, the patient was evaluated clinically. The patient tolerated treatment without significant acute effects, and was discharged to home in stable condition.    PLAN: Continue treatment as planned.  ________________________________  Sheral Apley. Tammi Klippel, M.D.

## 2013-05-28 NOTE — Progress Notes (Signed)
  Radiation Oncology         [phone] ________________________________  Name: ERVEY FALLIN MRN: [id number]  Date: 05/28/2013  DOB: 1955-09-02  Stereotactic Body Radiotherapy Treatment Procedure Note  CURRENT FRACTION:    5  PLANNED FRACTIONS:  5  NARRATIVE:  NETANEL YANNUZZI was brought to the stereotactic radiation treatment machine and placed supine on the CT couch. The patient was set up for stereotactic body radiotherapy on the body fix pillow.  3D TREATMENT PLANNING AND DOSIMETRY:  The patient's radiation plan was reviewed and approved prior to starting treatment.  It showed 3-dimensional radiation distributions overlaid onto the planning CT.  The [facility] for the target structures as well as the organs at risk were reviewed. The documentation of this is filed in the radiation oncology EMR.  SIMULATION VERIFICATION:  The patient underwent CT imaging on the treatment unit.  These were carefully aligned to document that the ablative radiation dose would cover the target volume and maximally spare the nearby organs at risk according to the planned distribution.  SPECIAL TREATMENT PROCEDURE: ZACCHEUS EDMISTER received high dose ablative stereotactic body radiotherapy to the planned target volume without unforeseen complications. Treatment was delivered uneventfully. The high doses associated with stereotactic body radiotherapy and the significant potential risks require careful treatment set up and patient monitoring constituting a special treatment procedure   STEREOTACTIC TREATMENT MANAGEMENT:  Following delivery, the patient was evaluated clinically. The patient tolerated treatment without significant acute effects, and was discharged to home in stable condition.    PLAN: Continue treatment as planned.  ________________________________  Sheral Apley. Tammi Klippel, M.D.

## 2013-05-31 NOTE — Progress Notes (Signed)
  Radiation Oncology         [phone] ________________________________  Name: Roger Walton MRN: [id number]  Date: 05/28/2013  DOB: 1956-01-23  End of Treatment Note  Diagnosis:  58 year old man with a C7 spinal invading primary lung cancer in a previously irradiated field with progression, severe pain, and neurologic symptoms in addition to an solitary lung oligometastasis  Indication for treatment:  Palliation of Pain and Preservation of Spinal Function       Radiation treatment dates:   05/14/2013, 05/17/2013, 05/21/2013, 05/26/2013, 05/28/2013  Site/dose:    1. The C7 spine-invading recurrent primary lung cancer was treated to 50 Gy in 5 fractions with SBRT 2. The solitary right lower lung oligometastasis was treated to 54 Gy in 3 fractions with SBRT  Beams/energy:    1. The C7 spine-invading recurrent primary lung cancer was treated and using a body fix pillow positioner and daily cone beam CT prior to each fraction. Beams were delivered with 4 volumetric ARC intensity modulated optimized beams delivering 6 megavolt photons 2. The solitary right lower lung oligometastasis was using the same setup and localization as above with two volumetric ARC intensity modulated optimized beams delivering 6 megavolt photons  Narrative: The patient tolerated /radiation treatment relatively well.   His shoulder and arm pain remained severe through his course of radiation. However, he was able to tolerate his treatment position better during his final fraction indicating some improvement.  Plan: The patient has completed radiation treatment. The patient will return to radiation oncology clinic for routine followup in one month. I advised them to call or return sooner if they have any questions or concerns related to their recovery or treatment. ________________________________  Sheral Apley. Tammi Klippel, M.D.

## 2013-06-14 ENCOUNTER — Other Ambulatory Visit: Payer: Self-pay | Admitting: Licensed Clinical Social Worker

## 2013-06-14 DIAGNOSIS — R569 Unspecified convulsions: Secondary | ICD-10-CM

## 2013-06-14 DIAGNOSIS — G40909 Epilepsy, unspecified, not intractable, without status epilepticus: Secondary | ICD-10-CM

## 2013-06-14 MED ORDER — PHENOBARBITAL 64.8 MG PO TABS: 64.8000 mg | ORAL_TABLET | Freq: Every day | ORAL | Status: DC

## 2013-06-14 MED ORDER — CARBAMAZEPINE 200 MG PO TABS: ORAL_TABLET | ORAL | Status: DC

## 2013-06-21 ENCOUNTER — Telehealth: Payer: Self-pay | Admitting: *Deleted

## 2013-06-21 ENCOUNTER — Other Ambulatory Visit: Payer: Self-pay | Admitting: Medical Oncology

## 2013-06-21 DIAGNOSIS — C3410 Malignant neoplasm of upper lobe, unspecified bronchus or lung: Secondary | ICD-10-CM

## 2013-06-21 NOTE — Telephone Encounter (Signed)
Patient's wife called requesting an appointment this week, states her husband has a painful sore on his left leg that is not healing.  Patient given an appointment tomorrow with Dr. Megan Salon. Landis Gandy, RN

## 2013-06-22 ENCOUNTER — Ambulatory Visit: Payer: Medicare Other | Admitting: Internal Medicine

## 2013-06-22 ENCOUNTER — Telehealth: Payer: Self-pay | Admitting: *Deleted

## 2013-06-22 NOTE — Telephone Encounter (Signed)
Made new appt.

## 2013-06-24 ENCOUNTER — Ambulatory Visit (INDEPENDENT_AMBULATORY_CARE_PROVIDER_SITE_OTHER): Payer: Medicare Other | Admitting: Infectious Diseases

## 2013-06-24 ENCOUNTER — Inpatient Hospital Stay (HOSPITAL_COMMUNITY)
Admission: AD | Admit: 2013-06-24 | Discharge: 2013-06-28 | DRG: 571 | Disposition: A | Discharge status: Home Health | Payer: Medicare Other | Source: Ambulatory Visit | Attending: Family Medicine | Admitting: Family Medicine

## 2013-06-24 ENCOUNTER — Encounter (INDEPENDENT_AMBULATORY_CARE_PROVIDER_SITE_OTHER): Payer: Self-pay | Admitting: Surgery

## 2013-06-24 ENCOUNTER — Encounter (HOSPITAL_COMMUNITY): Payer: Self-pay | Admitting: General Surgery

## 2013-06-24 ENCOUNTER — Encounter: Payer: Self-pay | Admitting: Infectious Diseases

## 2013-06-24 VITALS — BP 111/74 | HR 102 | Temp 98.6°F | Ht 68.0 in | Wt 133.0 lb

## 2013-06-24 DIAGNOSIS — B957 Other staphylococcus as the cause of diseases classified elsewhere: Secondary | ICD-10-CM | POA: Diagnosis present

## 2013-06-24 DIAGNOSIS — C3410 Malignant neoplasm of upper lobe, unspecified bronchus or lung: Secondary | ICD-10-CM

## 2013-06-24 DIAGNOSIS — Z9221 Personal history of antineoplastic chemotherapy: Secondary | ICD-10-CM

## 2013-06-24 DIAGNOSIS — T451X5A Adverse effect of antineoplastic and immunosuppressive drugs, initial encounter: Secondary | ICD-10-CM | POA: Diagnosis present

## 2013-06-24 DIAGNOSIS — Z87891 Personal history of nicotine dependence: Secondary | ICD-10-CM

## 2013-06-24 DIAGNOSIS — D539 Nutritional anemia, unspecified: Secondary | ICD-10-CM | POA: Diagnosis present

## 2013-06-24 DIAGNOSIS — Z8611 Personal history of tuberculosis: Secondary | ICD-10-CM | POA: Diagnosis present

## 2013-06-24 DIAGNOSIS — S71109A Unspecified open wound, unspecified thigh, initial encounter: Secondary | ICD-10-CM

## 2013-06-24 DIAGNOSIS — C7931 Secondary malignant neoplasm of brain: Secondary | ICD-10-CM

## 2013-06-24 DIAGNOSIS — L97909 Non-pressure chronic ulcer of unspecified part of unspecified lower leg with unspecified severity: Secondary | ICD-10-CM

## 2013-06-24 DIAGNOSIS — G8929 Other chronic pain: Secondary | ICD-10-CM | POA: Diagnosis present

## 2013-06-24 DIAGNOSIS — B20 Human immunodeficiency virus [HIV] disease: Secondary | ICD-10-CM

## 2013-06-24 DIAGNOSIS — R569 Unspecified convulsions: Secondary | ICD-10-CM | POA: Diagnosis present

## 2013-06-24 DIAGNOSIS — C7949 Secondary malignant neoplasm of other parts of nervous system: Secondary | ICD-10-CM

## 2013-06-24 DIAGNOSIS — C3490 Malignant neoplasm of unspecified part of unspecified bronchus or lung: Secondary | ICD-10-CM

## 2013-06-24 DIAGNOSIS — D702 Other drug-induced agranulocytosis: Secondary | ICD-10-CM | POA: Diagnosis present

## 2013-06-24 DIAGNOSIS — L989 Disorder of the skin and subcutaneous tissue, unspecified: Secondary | ICD-10-CM

## 2013-06-24 DIAGNOSIS — Z21 Asymptomatic human immunodeficiency virus [HIV] infection status: Secondary | ICD-10-CM | POA: Diagnosis present

## 2013-06-24 DIAGNOSIS — G40909 Epilepsy, unspecified, not intractable, without status epilepticus: Secondary | ICD-10-CM | POA: Diagnosis present

## 2013-06-24 DIAGNOSIS — I96 Gangrene, not elsewhere classified: Secondary | ICD-10-CM | POA: Diagnosis present

## 2013-06-24 DIAGNOSIS — Z923 Personal history of irradiation: Secondary | ICD-10-CM

## 2013-06-24 DIAGNOSIS — L97109 Non-pressure chronic ulcer of unspecified thigh with unspecified severity: Principal | ICD-10-CM | POA: Diagnosis present

## 2013-06-24 DIAGNOSIS — T375X5A Adverse effect of antiviral drugs, initial encounter: Secondary | ICD-10-CM | POA: Diagnosis present

## 2013-06-24 DIAGNOSIS — B359 Dermatophytosis, unspecified: Secondary | ICD-10-CM | POA: Diagnosis present

## 2013-06-24 DIAGNOSIS — F411 Generalized anxiety disorder: Secondary | ICD-10-CM | POA: Diagnosis present

## 2013-06-24 DIAGNOSIS — D701 Agranulocytosis secondary to cancer chemotherapy: Secondary | ICD-10-CM | POA: Diagnosis present

## 2013-06-24 DIAGNOSIS — Z85118 Personal history of other malignant neoplasm of bronchus and lung: Secondary | ICD-10-CM

## 2013-06-24 DIAGNOSIS — S71009A Unspecified open wound, unspecified hip, initial encounter: Secondary | ICD-10-CM

## 2013-06-24 HISTORY — PX: WOUND DEBRIDEMENT: SHX247

## 2013-06-24 LAB — COMPREHENSIVE METABOLIC PANEL
ALT: 19 U/L (ref 0–53)
AST: 27 U/L (ref 0–37)
Albumin: 2.6 g/dL — ABNORMAL LOW (ref 3.5–5.2)
Alkaline Phosphatase: 88 U/L (ref 39–117)
BILIRUBIN TOTAL: 0.2 mg/dL — AB (ref 0.3–1.2)
BUN: 18 mg/dL (ref 6–23)
CHLORIDE: 101 meq/L (ref 96–112)
CO2: 24 meq/L (ref 19–32)
CREATININE: 0.81 mg/dL (ref 0.50–1.35)
Calcium: 8.4 mg/dL (ref 8.4–10.5)
GFR calc non Af Amer: >90 mL/min (ref >90)
Glucose, Bld: 91 mg/dL (ref 70–99)
Potassium: 4.2 mEq/L (ref 3.7–5.3)
Sodium: 137 mEq/L (ref 137–147)
Total Protein: 6.6 g/dL (ref 6.0–8.3)

## 2013-06-24 LAB — CBC WITH DIFFERENTIAL/PLATELET
Basophils Absolute: 0 10*3/uL (ref 0.0–0.1)
Basophils Relative: 0 % (ref 0–1)
EOS PCT: 1 % (ref 0–5)
Eosinophils Absolute: 0 10*3/uL (ref 0.0–0.7)
HEMATOCRIT: 31.2 % — AB (ref 39.0–52.0)
HEMOGLOBIN: 10.9 g/dL — AB (ref 13.0–17.0)
LYMPHS PCT: 14 % (ref 12–46)
Lymphs Abs: 0.7 10*3/uL (ref 0.7–4.0)
MCH: 34.5 pg — ABNORMAL HIGH (ref 26.0–34.0)
MCHC: 34.9 g/dL (ref 30.0–36.0)
MCV: 98.7 fL (ref 78.0–100.0)
MONO ABS: 0.6 10*3/uL (ref 0.1–1.0)
MONOS PCT: 12 % (ref 3–12)
NEUTROS ABS: 4 10*3/uL (ref 1.7–7.7)
Neutrophils Relative %: 74 % (ref 43–77)
Platelets: 244 10*3/uL (ref 150–400)
RBC: 3.16 MIL/uL — ABNORMAL LOW (ref 4.22–5.81)
RDW: 13.7 % (ref 11.5–15.5)
WBC: 5.4 10*3/uL (ref 4.0–10.5)

## 2013-06-24 LAB — PROTIME-INR
INR: 1.06 (ref 0.00–1.49)
PROTHROMBIN TIME: 13.6 s (ref 11.6–15.2)

## 2013-06-24 LAB — MRSA PCR SCREENING: MRSA by PCR: NEGATIVE

## 2013-06-24 MED ORDER — DRONABINOL 2.5 MG PO CAPS
10.0000 mg | ORAL_CAPSULE | Freq: Two times a day (BID) | ORAL | Status: DC
  Administered 2013-06-25 – 2013-06-28 (×7): 10 mg via ORAL
  Filled 2013-06-24 (×3): qty 4
  Filled 2013-06-24: qty 2
  Filled 2013-06-24 (×4): qty 4

## 2013-06-24 MED ORDER — PHENOBARBITAL 32.4 MG PO TABS
64.8000 mg | ORAL_TABLET | Freq: Every day | ORAL | Status: DC
  Administered 2013-06-24 – 2013-06-27 (×4): 64.8 mg via ORAL
  Filled 2013-06-24 (×4): qty 2

## 2013-06-24 MED ORDER — EFAVIRENZ-EMTRICITAB-TENOFOVIR 600-200-300 MG PO TABS: 1.0000 | ORAL_TABLET | Freq: Every day | ORAL | Status: DC

## 2013-06-24 MED ORDER — EFAVIRENZ-EMTRICITAB-TENOFOVIR 600-200-300 MG PO TABS
1.0000 | ORAL_TABLET | Freq: Every day | ORAL | Status: DC
  Administered 2013-06-24 – 2013-06-27 (×4): 1 via ORAL
  Filled 2013-06-24 (×5): qty 1

## 2013-06-24 MED ORDER — CARBAMAZEPINE 200 MG PO TABS
600.0000 mg | ORAL_TABLET | Freq: Two times a day (BID) | ORAL | Status: DC
  Administered 2013-06-24 – 2013-06-28 (×8): 600 mg via ORAL
  Filled 2013-06-24 (×9): qty 3

## 2013-06-24 MED ORDER — HEPARIN SODIUM (PORCINE) 5000 UNIT/ML IJ SOLN
5000.0000 [IU] | Freq: Three times a day (TID) | INTRAMUSCULAR | Status: DC
  Administered 2013-06-24 – 2013-06-28 (×12): 5000 [IU] via SUBCUTANEOUS
  Filled 2013-06-24 (×14): qty 1

## 2013-06-24 MED ORDER — PHENOBARBITAL 64.8 MG PO TABS: 64.8000 mg | ORAL_TABLET | Freq: Every day | ORAL | Status: DC

## 2013-06-24 MED ORDER — GABAPENTIN 300 MG PO CAPS
300.0000 mg | ORAL_CAPSULE | Freq: Three times a day (TID) | ORAL | Status: DC
  Administered 2013-06-24 – 2013-06-28 (×13): 300 mg via ORAL
  Filled 2013-06-24 (×14): qty 1

## 2013-06-24 MED ORDER — COLLAGENASE 250 UNIT/GM EX OINT
TOPICAL_OINTMENT | Freq: Every day | CUTANEOUS | Status: DC
  Administered 2013-06-24 – 2013-06-28 (×3): via TOPICAL
  Filled 2013-06-24 (×2): qty 30

## 2013-06-24 MED ORDER — SULFAMETHOXAZOLE-TMP DS 800-160 MG PO TABS: 1.0000 | ORAL_TABLET | ORAL | Status: DC

## 2013-06-24 MED ORDER — SULFAMETHOXAZOLE-TMP DS 800-160 MG PO TABS
1.0000 | ORAL_TABLET | ORAL | Status: DC
  Administered 2013-06-25 – 2013-06-28 (×2): 1 via ORAL
  Filled 2013-06-24 (×2): qty 1

## 2013-06-24 NOTE — Assessment & Plan Note (Addendum)
The etiology of this is unlcear. It could be a MRSA furuncle however there is no d/c and I have not previously seen one this large. Some concern that he has a skin met. I will admit him for management of this (MRI, surgical eval and IV antibiotics- vanco/cefepime).  Will ask inpt ID MD know to follow along.

## 2013-06-24 NOTE — Progress Notes (Signed)
NURSING PROGRESS NOTE  Roger Walton [id number] Admission Data: 06/24/2013 7:59 PM Attending Provider: Nita Sells, MD [id number] Roger Sima, MD Code Status: Full   Roger Walton is a 58 y.o. male patient admitted from ED:  -No acute distress noted.  -No complaints of shortness of breath.  -No complaints of chest pain.    Blood pressure 119/79, pulse 109, temperature 98.5 F (36.9 C), temperature source Oral, resp. rate 18, height 5\' 9"  (1.753 m), SpO2 99.00%.   IV Fluids:  No PIV  Allergies:  Review of patient's allergies indicates no known allergies.  Past Medical History:   has a past medical history of Seizures (03/04/12); Lung cancer (07/13/12); HIV infection; SOB (shortness of breath); Chronic pain in right shoulder; and Anxiety.  Past Surgical History:   has past surgical history that includes Fine needle aspiration.  Social History:   reports that he has quit smoking. His smoking use included Cigarettes. He has a 40 pack-year smoking history. He has never used smokeless tobacco. He reports that he uses illicit drugs (Marijuana). He reports that he does not drink alcohol.  Skin: Ulcer, gangrene to left hip 3x3 black  Patient/Family orientated to room. Information packet given to patient/family. Admission inpatient armband information verified with patient/family to include name and date of birth and placed on patient arm. Side rails up x 2, fall assessment and education completed with patient/family. Patient/family able to verbalize understanding of risk associated with falls and verbalized understanding to call for assistance before getting out of bed. Call light within reach. Patient/family able to voice and demonstrate understanding of unit orientation instructions.    Will continue to evaluate and treat per MD orders.

## 2013-06-24 NOTE — Progress Notes (Signed)
   Subjective:    Patient ID: Roger Walton, male    DOB: 1955/09/24, 58 y.o.   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 Has been receiving concurrent chemoradiation with weekly carboplatin and paclitaxol. Is off CTX and XRT- for several months. Had repeat CT scan 04-05-13: 1. Size increase in right lower lobe pulmonary nodule consistent with bronchogenic carcinoma.  2. Continued decrease in size of right apical tumor.  3. Stable splenic lesions. [name] this to be benign.  Comes in today as a walk in due to pain in the back of his R leg. He has noted a painful lesion here but denies any d/c no fever or chills.  He thinks that this is from his XRT.  He also complains of numbness in his R hand.   HIV 1 RNA Quant (copies/mL)  Date Value  05/05/2013 82*  12/09/2012 <20   06/29/2012 54*     CD4 T Cell Abs (/uL)  Date Value  05/05/2013 100*  12/09/2012 120*  06/29/2012 160*      Review of Systems     Objective:   Physical Exam  Constitutional: He is oriented to person, place, and time. He appears well-developed and well-nourished.  Musculoskeletal:       Legs: Neurological: He is alert and oriented to person, place, and time.  Mild decrease in light touch in his R fingers.           Assessment & Plan:

## 2013-06-24 NOTE — H&P (Signed)
[facility]     Reason for Consult: skin lesion on the right posterior upper leg   Referring Physician: Dr. Verlon Au      Principal Problem:   Ulcer of lower limb Active Problems:   HIV DISEASE   DERMATOPHYTOSIS, SITE NOS   SEIZURE DISORDER   HX, PERSONAL, TUBERCULOSIS   Lung cancer, upper lobe   Chemotherapy induced neutropenia   Gangrene   . carbamazepine  600 mg Oral BID  . collagenase   Topical Daily  . [START ON 06/25/2013] dronabinol  10 mg Oral BID AC  . efavirenz-emtricitabine-tenofovir  1 tablet Oral QHS  . gabapentin  300 mg Oral TID  . heparin  5,000 Units Subcutaneous 3 times per day  . PHENobarbital  64.8 mg Oral QHS  . [START ON 06/25/2013] sulfamethoxazole-trimethoprim  1 tablet Oral Once per day on Mon Wed Fri    Recommendations: MRI  Surgical input  I will wait for MRI to consider antibiotics (vancomycin and cefepime)  Atripla  Bactrim MWF prophylaxis   Assessment: A single, circular lesion measuring approximately 5cm in diameter is in the middle of the posterior aspect of the right     Leg. There is no erythema in or around the lesion. The lesion appears like an ulcerative lesion with a black base. The lesion is tender to touch appears to be firm in consistency. There is no discharge. The draining superficial inguinal lymph nodes are tender and palpable. The lesion and the surrounding skin are the same temperature. Three of the superficial inguinal lymph nodes on the right side are palpable, soft and tender but no erythema is present.  Antibiotics: None till results come.  HPI: Roger Walton is a 58 y.o. male with stage IIB/IIIA non-small cell lung cancer, squamous cell Pancoast tumor, also a known case of epilepsy, walked in to clinic with this history of growing right posterior lesion of upper leg. The swelling was noticed by the patient 3 weeks ago when it became painful. He can not extend his leg fully because of the  pain. Painful but no drainage, no erythema. No such lesions anywhere else in the body have been reported. No fever, no chills. He has had a previous history of a boil in the buttocks in 2008 but resolved after treatment. Also reports pain in the index, middle and ring finger of the right hand but the sensations are intact but can not make a fist because of the pain.   Review of Systems: A comprehensive review of systems was negative.  Past Medical History  Diagnosis Date  . 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 HIV infection   . SOB (shortness of breath)   . Chronic pain in right shoulder     scapula and arm for 2 months   . Anxiety     History  Substance Use Topics  . Smoking status: Former Smoker -- 1.00 packs/day for 40 years    Types: Cigarettes  . Smokeless tobacco: Never Used     Comment: 07/03/12 states pt  . Alcohol Use: No    Family History  Problem Relation Age of Onset  . Colon cancer Neg Hx    No Known Allergies  OBJECTIVE: Blood pressure 119/79, pulse 109, temperature 98.5 F (36.9 C), temperature source Oral, resp. rate 18, height 5\' 9"  (1.753 m), SpO2 99.00%. General: A+Ox3, NAD,  lying comfortably in bed, normal built and height for his age. Skin: some scales are seen. No cutaneous lesions anywhere else on the body. Lungs: CTA B Cor: RRR, No m, added sounds Abdomen: tender, rigid Extremities: pain in the index, middle and ring finger of the right hand but the sensations are intact. Pulses normal. Power is decreased in the right hand but the limb movements proximal to the wrist are normal.   Microbiology: No results found for this or any previous visit (from the past 240 hour(s)).  Janalyn Shy, elective student [facility] [email or web address] [phone] pager  [phone] cell 06/24/2013, 6:17 PM

## 2013-06-24 NOTE — Consult Note (Signed)
Roger Walton [birth year]  [id number].   Primary Care MD: Dr. Lita Mains Requesting MD: Dr. Lita Mains Chief Complaint/Reason for Consult: posterior thigh wound HPI: This is a 58 yo black male with HIV, lung cancer s/p chemo and radiation, and seizure d/o.  About 3 weeks ago he noticed this area on his posterior medial left thigh that was quite tender.  It has worsened.  He denies any drainage, bloody or purulent, just pain.  Denies trauma.  Denies fevers, chills, sweats. No other skin lesions.  He went to his ID doctor today for evaluation.  This areas on his thigh was found and he was felt to require admission.  We have been asked to see the patient for further recommendations.  ROS : Please see HPI, otherwise patient c/o pain in his right shoulder and finger tips. + cough from a recent cold. Otherwise all other systems are negative  Family History  Problem Relation Age of Onset  . Colon cancer Neg Hx     Past Medical History  Diagnosis Date  . 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 HIV infection   . SOB (shortness of breath)   . Chronic pain in right shoulder     scapula and arm for 2 months   . Anxiety     Past Surgical History  Procedure Laterality Date  . Fine needle aspiration      Social History:  reports that he has quit smoking. His smoking use included Cigarettes. He has a 40 pack-year smoking history. He has never used smokeless tobacco. He reports that he uses illicit drugs (Marijuana). He reports that he does not drink alcohol.  Allergies: No Known Allergies  Medications Prior to Admission  Medication Sig Dispense Refill  . carbamazepine (TEGRETOL) 200 MG tablet take 3 tablets by mouth every morning 2 AT NOON and 3 tablets by mouth at bedtime  720 tablet  2  . dronabinol (MARINOL) 10 MG capsule Take 1 capsule (10 mg total) by mouth 2 (two) times daily before a meal.  90 capsule  3  .  efavirenz-emtricitabine-tenofovir (ATRIPLA) 600-200-300 MG per tablet Take 1 tablet by mouth at bedtime.  90 tablet  3  . gabapentin (NEURONTIN) 300 MG capsule Take 1 capsule (300 mg total) by mouth 3 (three) times daily.  90 capsule  5  . PHENobarbital (LUMINAL) 64.8 MG tablet Take 1 tablet (64.8 mg total) by mouth at bedtime.  90 tablet  2  . sulfamethoxazole-trimethoprim (BACTRIM DS) 800-160 MG per tablet Take 1 tablet by mouth 3 (three) times a week. Mondays, Wednesdays, Fridays.  12 tablet  11    There were no vitals taken for this visit. Physical Exam: General: skinny black male who is sitting up in NAD HEENT: head is normocephalic, atraumatic.  Sclera are noninjected.  PERRL.  Ears and nose without any masses or lesions.  Mouth is pink and moist Heart: regular, rate, and rhythm.  Normal s1,s2. No obvious murmurs, gallops, or rubs noted.  Palpable radial and pedal pulses bilaterally Lungs: CTAB, no wheezes, rhonchi, or rales noted.  Respiratory effort nonlabored Abd: soft, NT, ND, +BS, no masses, hernias, or organomegaly MS: all 4 extremities are symmetrical with no cyanosis, clubbing, or edema. Skin: warm and dry. He has an area of induration of about 8x7cm in size that is tender, but no erythema or evidence of cellulitis.  He has  a central area of dry gangrene that is about 3x3cm.   Typical, gangrenous odor is present.  No purulent drainage is noted. Psych: A&Ox3 with an appropriate affect.    No results found for this or any previous visit (from the past 48 hour(s)). No results found.     Assessment/Plan 1. Posterior, medial left thigh wound 2. HIV 3. Lung Cancer 4. Seizure disorder  Plan: 1. This area is dry gangrene and hard.  We are unable to debride this until it is softer.  Start NS WD dressing changes BID with santyl.  Will order hydrotherapy as well.  There is no acute evidence of infection at this time.  No immediate surgical intervention necessary.  Will  follow.  Banner Huckaba E 06/24/2013, 4:13 PM Pager: [phone]

## 2013-06-24 NOTE — H&P (Signed)
[facility] History and Physical  Roger Walton SWN:[id number] DOB: 10/21/1955 DOA: 06/24/2013  Referring physician: [name] PCP: Bobby Rumpf, MD  Specialists: Surgery  Chief Complaint:   HPI: Roger Walton is a 58 y.o. male came to [facility] 06/24/2013 as a direct admit from ID Md's Dr. Algis Downs office.    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 Mohammed/Dr. Hendrickson/Dr. Tammi Klippel and was on systemic chemotherapy c 5/5 fractions SBRT, 3 fractions SBRT to R Lower lung lesion     He also has a known history of childhood epilepsy from age of about 58 years old and takes his medications regularly he is on disability for this  Relates that over the past 2 weeks he noticed a bump in his left posterior thigh area which progressively increased in size to the point that he decided to go to [facility] for an assessment. He doesn't really tell me specifically about any oozing from this area, any itching or scratching although he has a little bit of dry skin on the front of the thigh  He has recently lost 20 pounds unintentionally per his report however.  He states that he is chronic pain in his right hand specifically in his right middle finger, and experienced a sharp pains in his hands occasionally. He is wondering if this is related in any way to the rest of his issues. He states is going on for 2 weeks  Review of chart reveals MRI of the neck 05/06/13 soft tissue showing infiltration of the right C7, C7 and the nurse at the lateral epidural space and musculature there The tumor appeared to involve the right vertebral artery with no great vessel occlusion and heterogeneous signal of the right anterior laryngeal cartilage was noted as well   Review of Systems:  Denies any fever  chills nausea vomiting however has noticed that his urine has changed in color to a greenish color Denies any diarrhea denies any thrush and is as difficulty swallowing denies any chest pain + Weight loss  Past Medical History  Diagnosis Date  . 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 HIV infection   . SOB (shortness of breath)   . Chronic pain in right shoulder     scapula and arm for 2 months   . Anxiety    Past Surgical History  Procedure Laterality Date  . Fine needle aspiration     Social History:  History   Social History Narrative  . No narrative on file    No Known Allergies  Family History  Problem Relation Age of Onset  . Colon cancer Neg Hx     Prior to Admission medications   Medication Sig Start Date End Date Taking? Authorizing Provider  carbamazepine (TEGRETOL) 200 MG tablet take 3 tablets by mouth every morning 2 AT NOON and 3 tablets by mouth at bedtime 06/14/13   Campbell Riches, MD  dronabinol (MARINOL) 10 MG capsule Take 1 capsule (10 mg total) by mouth 2 (two) times daily before a meal. 05/10/13   Campbell Riches, MD  efavirenz-emtricitabine-tenofovir (ATRIPLA) 600-200-300 MG per tablet Take 1 tablet by mouth at bedtime. 11/19/12   Dellis Filbert  Chevis Pretty, MD  gabapentin (NEURONTIN) 300 MG capsule Take 1 capsule (300 mg total) by mouth 3 (three) times daily. 04/28/13   Lora Paula, MD  PHENobarbital (LUMINAL) 64.8 MG tablet Take 1 tablet (64.8 mg total) by mouth at bedtime. 06/14/13   Campbell Riches, MD  sulfamethoxazole-trimethoprim (BACTRIM DS) 800-160 MG per tablet Take 1 tablet by mouth 3 (three) times a week. Mondays, Wednesdays, Fridays. 05/07/13   Campbell Riches, MD   Physical Exam: There were no vitals filed for this visit.   General:  Alert pleasant oriented no apparent distress   Eyes: EOMI, NCAT, no pallor no icterus , no thrush   ENT: Soft supple submandibular  lymphadenopathy right side   Neck: See above no JVD   Cardiovascular: S1-S2 no murmur rub or gallop   Respiratory: Clinically clear   Abdomen: See below picture in addition there is tender lymphadenopathy in the left groin about 3 cm enlarged. Vertical chain   Skin:       Musculoskeletal: Range of motion intact however weaker in right hand   Psychiatric: Euymic and pleasant   Neurologic: See above, in addition power is 5/5 bilaterally with brisk reflexes however he is weaker with metacarpal joints of the hand  Labs on Admission:  Basic Metabolic Panel: No results found for this basename: NA, K, CL, CO2, GLUCOSE, BUN, CREATININE, CALCIUM, MG, PHOS,  in the last 168 hours Liver Function Tests: No results found for this basename: AST, ALT, ALKPHOS, BILITOT, PROT, ALBUMIN,  in the last 168 hours No results found for this basename: LIPASE, AMYLASE,  in the last 168 hours No results found for this basename: AMMONIA,  in the last 168 hours CBC: No results found for this basename: WBC, NEUTROABS, HGB, HCT, MCV, PLT,  in the last 168 hours Cardiac Enzymes: No results found for this basename: CKTOTAL, CKMB, CKMBINDEX, TROPONINI,  in the last 168 hours  BNP (last 3 results) No results found for this basename: PROBNP,  in the last 8760 hours CBG: No results found for this basename: GLUCAP,  in the last 168 hours  Radiological Exams on Admission: No results found.  EKG: Independently reviewed. None Performed  Assessment/Plan Principal Problem:   Ulcer of lower limb-sounds infectious however given his history of cancer especially with decreased CD4 cells and being on recent chemotherapy/XRT, I am not sure if this may represent a de novo tumor with necrosis. I appreciate surgery input. I have added Dr. Earlie Server to list of consultants.   I will MRI his pelvis , right lower extremity and his abdomen to make sure that there is no concerns for distant metastases.  We will hold off on  antibiotics for now as per infectious disease physician Dr. Linus Salmons Agree with hydrotherapy and surgical management  Active Problems:   HIV DISEASE-will defer to infectious disease   DERMATOPHYTOSIS, SITE NOS   SEIZURE DISORDER-continue home medications of Tegretol 600 twice a day-please note that this is a CYP450 enzyme inducer and I am not sure if this would interact with his HIV medications   HX, PERSONAL, TUBERCULOSIS   Lung cancer, upper lobe-have consulted oncology   Chemotherapy induced neutropenia-await baseline labs including CBC plus differential, chem 12 and INR   weight loss -severe protein energy malnutrition, continue Marinol continue nutritional input and hope for improvement     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

## 2013-06-24 NOTE — Consult Note (Addendum)
    [facility] Infect[facility] Disease     Reason for Consult: Skin lesion    Referring Physician: Dr. Verlon Au  PMH: HIV, lung CA  . collagenase   Topical Daily    Recommendations: MRI Surgical input I will wait for MRI to consider antibiotics (vancomycin and cefepime) Atripla Bactrim MWF prophylaxis  Assessment: He has an large lesion with no drainage, no erythema, tender.     Antibiotics: none  HPI: Roger Walton is a 58 y.o. male with stage IIB/IIIA non-small cell lung cancer, squamous cell Pancoast tumor, walked in to clinic with this history of growing right posterior lesion of upper leg.  Painful but no drainage, no erythema.  No fever, no chills.    Review of Systems: A comprehensive review of systems was negative.  Past Medical History  Diagnosis Date  . 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 HIV infection   . SOB (shortness of breath)   . Chronic pain in right shoulder     scapula and arm for 2 months   . Anxiety     History  Substance Use Topics  . Smoking status: Former Smoker -- 1.00 packs/day for 40 years    Types: Cigarettes  . Smokeless tobacco: Never Used     Comment: 07/03/12 states pt  . Alcohol Use: No    Family History  Problem Relation Age of Onset  . Colon cancer Neg Hx    No Known Allergies  OBJECTIVE: There were no vitals taken for this visit. General: awake, alert, nad Skin: no rashes Lungs: CTA B Cor: tachy RR without m Abdomen: soft, nt, nd Ext: right posterior leg with 3-4 cm round dried lesion with no drainage; + 3 nodes in right groin  Microbiology: No results found for this or any previous visit (from the past 240 hour(s)).  Scharlene Gloss, [place] [place] [email or web address] [phone] pager  [phone] cell 06/24/2013, 4:14 PM

## 2013-06-25 ENCOUNTER — Inpatient Hospital Stay (HOSPITAL_COMMUNITY): Payer: Medicare Other

## 2013-06-25 ENCOUNTER — Encounter (HOSPITAL_COMMUNITY): Payer: Self-pay | Admitting: Radiology

## 2013-06-25 DIAGNOSIS — L97909 Non-pressure chronic ulcer of unspecified part of unspecified lower leg with unspecified severity: Secondary | ICD-10-CM

## 2013-06-25 DIAGNOSIS — R229 Localized swelling, mass and lump, unspecified: Secondary | ICD-10-CM

## 2013-06-25 LAB — BASIC METABOLIC PANEL
BUN: 18 mg/dL (ref 6–23)
CALCIUM: 8.5 mg/dL (ref 8.4–10.5)
CO2: 23 mEq/L (ref 19–32)
Chloride: 103 mEq/L (ref 96–112)
Creatinine, Ser: 0.87 mg/dL (ref 0.50–1.35)
GFR calc Af Amer: >90 mL/min (ref >90)
GFR calc non Af Amer: >90 mL/min (ref >90)
GLUCOSE: 95 mg/dL (ref 70–99)
POTASSIUM: 4.4 meq/L (ref 3.7–5.3)
SODIUM: 139 meq/L (ref 137–147)

## 2013-06-25 LAB — CBC
HCT: 28.8 % — ABNORMAL LOW (ref 39.0–52.0)
HEMOGLOBIN: 10.1 g/dL — AB (ref 13.0–17.0)
MCH: 35.2 pg — AB (ref 26.0–34.0)
MCHC: 35.1 g/dL (ref 30.0–36.0)
MCV: 100.3 fL — ABNORMAL HIGH (ref 78.0–100.0)
PLATELETS: 229 10*3/uL (ref 150–400)
RBC: 2.87 MIL/uL — AB (ref 4.22–5.81)
RDW: 14 % (ref 11.5–15.5)
WBC: 3.7 10*3/uL — AB (ref 4.0–10.5)

## 2013-06-25 LAB — GLUCOSE, CAPILLARY: GLUCOSE-CAPILLARY: 100 mg/dL — AB (ref 70–99)

## 2013-06-25 MED ORDER — IOHEXOL 300 MG/ML  SOLN
25.0000 mL | INTRAMUSCULAR | Status: AC
  Administered 2013-06-25 (×2): 25 mL via ORAL

## 2013-06-25 MED ORDER — IOHEXOL 300 MG/ML  SOLN
80.0000 mL | Freq: Once | INTRAMUSCULAR | Status: AC | PRN
  Administered 2013-06-25: 80 mL via INTRAVENOUS

## 2013-06-25 MED ORDER — PIPERACILLIN-TAZOBACTAM 3.375 G IVPB
3.3750 g | Freq: Three times a day (TID) | INTRAVENOUS | Status: DC
  Administered 2013-06-25 – 2013-06-28 (×8): 3.375 g via INTRAVENOUS
  Filled 2013-06-25 (×10): qty 50

## 2013-06-25 MED ORDER — OXYCODONE HCL 5 MG PO TABS
5.0000 mg | ORAL_TABLET | ORAL | Status: DC | PRN
  Administered 2013-06-25 – 2013-06-27 (×4): 5 mg via ORAL
  Filled 2013-06-25 (×5): qty 1

## 2013-06-25 MED ORDER — BOOST / RESOURCE BREEZE PO LIQD
1.0000 | Freq: Two times a day (BID) | ORAL | Status: DC
  Administered 2013-06-25 – 2013-06-28 (×7): 1 via ORAL

## 2013-06-25 MED ORDER — ADULT MULTIVITAMIN W/MINERALS CH
1.0000 | ORAL_TABLET | Freq: Every day | ORAL | Status: DC
  Administered 2013-06-25 – 2013-06-28 (×4): 1 via ORAL
  Filled 2013-06-25 (×4): qty 1

## 2013-06-25 NOTE — H&P (Signed)
Consult note reviewed, please see my full consult note.

## 2013-06-25 NOTE — Progress Notes (Signed)
Note: This document was prepared with digital dictation and possible smart phrase technology. Any transcriptional errors that result from this process are unintentional.   Roger Walton [id number] DOB: 06/06/1955 DOA: 06/24/2013 PCP: Bobby Rumpf, MD  Brief narrative: 58 y.o ? 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 Mohammed/Dr. Hendrickson/Dr. Tammi Klippel and was on systemic chemotherapy c 5/5 fractions SBRT, 3 fractions SBRT to R Lower lung lesion  He also has a known history of childhood epilepsy from age of about 58 years old and takes his medications  Admitted for lower limb ulcer, unclear etiology  Past medical history-As per Problem list Chart reviewed as below- Reviewed  Consultants:  Infectious disease  General surgery  Procedures:  None so far other than CT scans  Antibiotics:  Zosyn 3/6   Subjective  Doing well No pains Tolerating diet at bedside No fever chills diarrhea Maloder noted in room that wasn't present yesterday   Objective    Interim History: None  Telemetry: Sinus   Objective: Filed Vitals:   06/24/13 1615 06/24/13 2206 06/25/13 0512 06/25/13 1409  BP: 119/79 118/72 102/64 114/75  Pulse: 109 95 90 84  Temp: 98.5 F (36.9 C) 99.8 F (37.7 C) 99.9 F (37.7 C) 98 F (36.7 C)  TempSrc: Oral Oral Oral Oral  Resp: 18 17 16 18   Height: 5\' 9"  (1.753 m)     Weight: 60.918 kg (134 lb 4.8 oz)     SpO2: 99% 96% 95% 94%    Intake/Output Summary (Last 24 hours) at 06/25/13 1843 Last data filed at 06/25/13 1410  Gross per 24 hour  Intake    110 ml  Output      0 ml  Net    110 ml    Exam:  General: EOMI, NCAT Cardiovascular: S1-S2 no murmur rub or gallop Respiratory: Clinically clear Abdomen: Soft nontender nondistended Skin wound exam  deferred today has burns over the upper back and chest- Neuro grossly intact other than neuropathic changes right upper extremity  Data Reviewed: Basic Metabolic Panel:  Recent Labs Lab 06/24/13 1957 06/25/13 0547  NA 137 139  K 4.2 4.4  CL 101 103  CO2 24 23  GLUCOSE 91 95  BUN 18 18  CREATININE 0.81 0.87  CALCIUM 8.4 8.5   Liver Function Tests:  Recent Labs Lab 06/24/13 1957  AST 27  ALT 19  ALKPHOS 88  BILITOT 0.2*  PROT 6.6  ALBUMIN 2.6*   No results found for this basename: LIPASE, AMYLASE,  in the last 168 hours No results found for this basename: AMMONIA,  in the last 168 hours CBC:  Recent Labs Lab 06/24/13 1957 06/25/13 0547  WBC 5.4 3.7*  NEUTROABS 4.0  --   HGB 10.9* 10.1*  HCT 31.2* 28.8*  MCV 98.7 100.3*  PLT 244 229   Cardiac Enzymes: No results found for this basename: CKTOTAL, CKMB, CKMBINDEX, TROPONINI,  in the last 168 hours BNP: No components found with this basename: POCBNP,  CBG:  Recent Labs Lab 06/25/13 0757  GLUCAP 100*    Recent Results (from the past 240 hour(s))  MRSA PCR SCREENING     Status: None   Collection Time    06/24/13  6:55 PM      Result Value Ref Range Status   MRSA  by PCR NEGATIVE  NEGATIVE Final   Comment:            The GeneXpert MRSA Assay (FDA     approved for NASAL specimens     only), is one component of a     comprehensive MRSA colonization     surveillance program. It is not     intended to diagnose MRSA     infection nor to guide or     monitor treatment for     MRSA infections.     Studies:              All Imaging reviewed and is as per above notation   Scheduled Meds: . carbamazepine  600 mg Oral BID  . collagenase   Topical Daily  . dronabinol  10 mg Oral BID AC  . efavirenz-emtricitabine-tenofovir  1 tablet Oral QHS  . feeding supplement (RESOURCE BREEZE)  1 Container Oral BID BM  . gabapentin  300 mg Oral TID  . heparin  5,000 Units Subcutaneous 3 times per day  . multivitamin  with minerals  1 tablet Oral Daily  . PHENobarbital  64.8 mg Oral QHS  . sulfamethoxazole-trimethoprim  1 tablet Oral Once per day on Mon Wed Fri   Continuous Infusions:    Assessment/Plan:  Ulcer of lower limb-suspect infection > cancer.  Mild leukopenia-CT scan lower extremity shows gas.  Empiric Zosyn started 3/6.  Patient probably not mounting significant immune response secondary to immunosuppression from #2 Agree with hydrotherapy and surgical management HIV DISEASE-will defer to infectious disease  DERMATOPHYTOSIS, SITE NOS  SEIZURE DISORDER-continue home medications of Tegretol 600 twice a day-please note that this is a CYP450 enzyme inducer and I am not sure if this would interact with his HIV medications  HX, PERSONAL, TUBERCULOSIS  Lung cancer, upper lobe-have courtesy of alerted oncology  Chemotherapy induced neutropenia-await baseline labs including CBC plus differential, chem 12 and INR  weight loss -severe protein energy malnutrition, continue Marinol continue nutritional input and hope for improvement  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

## 2013-06-25 NOTE — Progress Notes (Signed)
Physical Therapy Wound Treatment Patient Details  Name: Roger Walton MRN: [id number] Date of Birth: 1955/11/11  Today's Date: 06/25/2013 Time: 1020-1037 Time Calculation (min): 17 min  Subjective  Subjective: Pt states that the wound area is sore. Patient and Family Stated Goals: Get wound better Date of Onset:  (several months ago)  Pain Score: Pain Score: 4   Wound Assessment  Wound / Incision (Open or Dehisced) 06/24/13 Other (Comment) Hip Left;Medial;Mid (Active)  Dressing Type Moist to dry;santyl;Other (Comment);Gauze (Comment) 06/25/2013  1:41 PM  Dressing Changed Changed 06/25/2013  1:41 PM  Dressing Status Clean;Dry;Intact 06/25/2013  1:41 PM  Dressing Change Frequency Twice a day 06/25/2013  1:41 PM  Site / Wound Assessment Black 06/25/2013  1:41 PM  % Wound base Red or Granulating 0% 06/25/2013  1:41 PM  % Wound base Yellow 0% 06/25/2013  1:41 PM  % Wound base Black 100% 06/25/2013  1:41 PM  % Wound base Other (Comment) 0% 06/25/2013  1:41 PM  Peri-wound Assessment Induration;Edema 06/25/2013  1:41 PM  Wound Length (cm) 3 cm 06/25/2013  1:41 PM  Wound Width (cm) 3 cm 06/25/2013  1:41 PM  Margins Attached edges (approximated) 06/25/2013  9:00 AM  Drainage Amount Minimal 06/25/2013  1:41 PM  Drainage Description Serous 06/25/2013  1:41 PM  Treatment Debridement (Selective);Hydrotherapy (Pulse lavage);Packing (Saline gauze) 06/25/2013  1:41 PM   Hydrotherapy Pulsed lavage therapy - wound location: lt posterior thigh Pulsed Lavage with Suction (psi): 12 psi Pulsed Lavage with Suction - Normal Saline Used: 1000 mL Pulsed Lavage Tip: Tip with splash shield Selective Debridement Selective Debridement - Location: lt posterior thigh Selective Debridement - Tools Used: Scissors;Forceps Selective Debridement - Tissue Removed: black eschar   Wound Assessment and Plan  Wound Therapy - Assess/Plan/Recommendations Wound Therapy - Clinical Statement: Pt presents to hydrotherapy with necrotic wound that can  benefit from hydrotherapy to decrease eschar and promote wound healing. Wound Therapy - Functional Problem List: Decr activity due to lower extremity wound. Factors Delaying/Impairing Wound Healing: Multiple medical problems;Polypharmacy Hydrotherapy Plan: Debridement;Dressing change;Patient/family education;Pulsatile lavage with suction Wound Therapy - Frequency: 6X / week Wound Therapy - Follow Up Recommendations: Home health RN Wound Plan: See above  Wound Therapy Goals- Improve the function of patient's integumentary system by progressing the wound(s) through the phases of wound healing (inflammation - proliferation - remodeling) by: Decrease Necrotic Tissue to: 90% Decrease Necrotic Tissue - Progress: Goal set today Increase Granulation Tissue to: 10% Increase Granulation Tissue - Progress: Goal set today Goals/treatment plan/discharge plan were made with and agreed upon by patient/family: Yes Time For Goal Achievement: 7 days Wound Therapy - Potential for Goals: Fair  Goals will be updated until maximal potential achieved or discharge criteria met.  Discharge criteria: when goals achieved, discharge from hospital, MD decision/surgical intervention, no progress towards goals, refusal/missing three consecutive treatments without notification or medical reason.  GP     Shaquisha Wynn 06/25/2013, 1:49 PM  [place]

## 2013-06-25 NOTE — Progress Notes (Signed)
Patient interviewed and examined, agree with PA note above.  Edward Jolly MD, FACS  06/25/2013 6:00 PM

## 2013-06-25 NOTE — Progress Notes (Addendum)
ANTIBIOTIC CONSULT NOTE - INITIAL  Pharmacy Consult for Zosyn Indication: Wound Infection-  Lower extremity  No Known Allergies  Patient Measurements: Height: 5\' 9"  (175.3 cm) Weight: 134 lb 4.8 oz (60.918 kg) IBW/kg (Calculated) : 70.7   Vital Signs: Temp: 98 F (36.7 C) (03/06 1409) Temp src: Oral (03/06 1409) BP: 114/75 mmHg (03/06 1409) Pulse Rate: 84 (03/06 1409) Intake/Output from previous day:   Intake/Output from this shift: Total I/O In: 110 [P.O.:110] Out: -   Labs:  Recent Labs  06/24/13 1957 06/25/13 0547  WBC 5.4 3.7*  HGB 10.9* 10.1*  PLT 244 229  CREATININE 0.81 0.87   Estimated Creatinine Clearance: 78.8 ml/min (by C-G formula based on Cr of 0.87). No results found for this basename: VANCOTROUGH, Corlis Leak, VANCORANDOM, Sneads, GENTPEAK, GENTRANDOM, TOBRATROUGH, TOBRAPEAK, TOBRARND, AMIKACINPEAK, AMIKACINTROU, AMIKACIN,  in the last 72 hours   Microbiology: Recent Results (from the past 720 hour(s))  MRSA PCR SCREENING     Status: None   Collection Time    06/24/13  6:55 PM      Result Value Ref Range Status   MRSA by PCR NEGATIVE  NEGATIVE Final   Comment:            The GeneXpert MRSA Assay (FDA     approved for NASAL specimens     only), is one component of a     comprehensive MRSA colonization     surveillance program. It is not     intended to diagnose MRSA     infection nor to guide or     monitor treatment for     MRSA infections.    Medical History: Past Medical History  Diagnosis Date  . 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 HIV infection   . SOB (shortness of breath)   . Chronic pain in right shoulder     scapula and arm for 2 months   . Anxiety     Medications:  Prescriptions prior to admission  Medication Sig Dispense Refill  . carbamazepine (TEGRETOL) 200 MG tablet take 3 tablets by mouth every morning 2 AT NOON and 3 tablets by mouth at  bedtime  720 tablet  2  . dronabinol (MARINOL) 10 MG capsule Take 1 capsule (10 mg total) by mouth 2 (two) times daily before a meal.  90 capsule  3  . efavirenz-emtricitabine-tenofovir (ATRIPLA) 600-200-300 MG per tablet Take 1 tablet by mouth at bedtime.  90 tablet  3  . gabapentin (NEURONTIN) 300 MG capsule Take 1 capsule (300 mg total) by mouth 3 (three) times daily.  90 capsule  5  . PHENobarbital (LUMINAL) 64.8 MG tablet Take 1 tablet (64.8 mg total) by mouth at bedtime.  90 tablet  2  . sulfamethoxazole-trimethoprim (BACTRIM DS) 800-160 MG per tablet Take 1 tablet by mouth 3 (three) times a week. Mondays, Wednesdays, Fridays.  12 tablet  11   Scheduled:  . carbamazepine  600 mg Oral BID  . collagenase   Topical Daily  . dronabinol  10 mg Oral BID AC  . efavirenz-emtricitabine-tenofovir  1 tablet Oral QHS  . feeding supplement (RESOURCE BREEZE)  1 Container Oral BID BM  . gabapentin  300 mg Oral TID  . heparin  5,000 Units Subcutaneous 3 times per day  . multivitamin with minerals  1 tablet Oral Daily  . PHENobarbital  64.8 mg Oral QHS  .  sulfamethoxazole-trimethoprim  1 tablet Oral Once per day on Mon Wed Fri   Assessment: 58 y.o male with stage IIB/IIIA non-small cell lung cancer, squamous cell Pancoast tumor admitted for left lower limb ulcer/mass, unclear etiology. Starting empiric zosyn for wound infection. SCr 0.87, estimated CrCL is 78 ml/min. Afebrile, WBC 3.7K.    06/25/13  CT Left Femur: Large subcutaneous soft tissue defect noted posteriorly to the level of the fascia. No drainable fluid collection.     Plan:  Zosyn 3.375 gm IV q8hr (extended 4 hour infusion). Monitor clinical status, renal function, adjust dose if necessary.  Nicole Cella, RPh Clinical Pharmacist Pager: [phone] 06/25/2013,7:00 PM

## 2013-06-25 NOTE — Progress Notes (Signed)
Subjective: Foul smell in the room today.  No changes to wound.  Still tender, draining some.  Hydrotherapy seeing patient today, santyl ordered but not applied and no WD dressings were applied to the patient yesterday.  He's drinking CT contrast.  Objective: Vital signs in last 24 hours: Temp:  [98.5 F (36.9 C)-99.9 F (37.7 C)] 99.9 F (37.7 C) (03/06 0512) Pulse Rate:  [90-109] 90 (03/06 0512) Resp:  [16-18] 16 (03/06 0512) BP: (102-119)/(64-79) 102/64 mmHg (03/06 0512) SpO2:  [95 %-99 %] 95 % (03/06 0512) Weight:  [133 lb (60.328 kg)-134 lb 4.8 oz (60.918 kg)] 134 lb 4.8 oz (60.918 kg) (03/05 1615) Last BM Date: 06/23/13  Intake/Output from previous day:   Intake/Output this shift:    PE: Gen:  Alert, NAD, pleasant Skin: warm and dry. He has an area of induration of about 8x7cm in size that is tender, but no erythema or evidence of cellulitis. He has a central area of dry gangrene that is about 3x3cm round. Typical, gangrenous odor is present and stronger today than yesterday. Serous but no purulent drainage is noted. Groin:  Left inguinal LAD noted     Lab Results:   Recent Labs  06/24/13 1957 06/25/13 0547  WBC 5.4 3.7*  HGB 10.9* 10.1*  HCT 31.2* 28.8*  PLT 244 229   BMET  Recent Labs  06/24/13 1957 06/25/13 0547  NA 137 139  K 4.2 4.4  CL 101 103  CO2 24 23  GLUCOSE 91 95  BUN 18 18  CREATININE 0.81 0.87  CALCIUM 8.4 8.5   PT/INR  Recent Labs  06/24/13 1957  LABPROT 13.6  INR 1.06   CMP     Component Value Date/Time   NA 139 06/25/2013 0547   NA 136 04/06/2013 0914   K 4.4 06/25/2013 0547   K 4.1 04/06/2013 0914   CL 103 06/25/2013 0547   CL 103 08/24/2012 1129   CO2 23 06/25/2013 0547   CO2 26 04/06/2013 0914   GLUCOSE 95 06/25/2013 0547   GLUCOSE 126 04/06/2013 0914   GLUCOSE 103* 08/24/2012 1129   BUN 18 06/25/2013 0547   BUN 9.8 04/06/2013 0914   CREATININE 0.87 06/25/2013 0547   CREATININE 0.95 05/05/2013 1530   CREATININE 1.0 04/06/2013  0914   CALCIUM 8.5 06/25/2013 0547   CALCIUM 9.2 04/06/2013 0914   PROT 6.6 06/24/2013 1957   PROT 7.7 04/06/2013 0914   ALBUMIN 2.6* 06/24/2013 1957   ALBUMIN 3.4* 04/06/2013 0914   AST 27 06/24/2013 1957   AST 20 04/06/2013 0914   ALT 19 06/24/2013 1957   ALT 13 04/06/2013 0914   ALKPHOS 88 06/24/2013 1957   ALKPHOS 94 04/06/2013 0914   BILITOT 0.2* 06/24/2013 1957   BILITOT 0.37 04/06/2013 0914   GFRNONAA >90 06/25/2013 0547   GFRAA >90 06/25/2013 0547   Lipase  No results found for this basename: lipase       Studies/Results: No results found.  Anti-infectives: Anti-infectives   Start     Dose/Rate Route Frequency Ordered Stop   06/25/13 0900  sulfamethoxazole-trimethoprim (BACTRIM DS) 800-160 MG per tablet 1 tablet     1 tablet Oral Once per day on Mon Wed Fri 06/24/13 1631     06/25/13 0900  sulfamethoxazole-trimethoprim (BACTRIM DS) 800-160 MG per tablet 1 tablet  Status:  Discontinued     1 tablet Oral Once per day on Mon Wed Fri 06/24/13 1732 06/24/13 1738   06/24/13 2200  efavirenz-emtricitabine-tenofovir (ATRIPLA) 600-200-300  MG per tablet 1 tablet     1 tablet Oral Daily at bedtime 06/24/13 1630     06/24/13 2200  efavirenz-emtricitabine-tenofovir (ATRIPLA) 600-200-300 MG per tablet 1 tablet  Status:  Discontinued     1 tablet Oral Daily at bedtime 06/24/13 1732 06/24/13 1738       Assessment/Plan 1. Posterior, medial left thigh wound  2. HIV  3. Lung Cancer  4. Seizure disorder   Plan:  1. This area is dry gangrene and hard. We are unable to debride this until it is softer.  2.  He did not have a WD dressing on this morning as per orders yesterday, his wound needs to be dressed as WD in order to allow it to soften up.  Apply Santyl daily. 3.  Continue Hydrotherapy 4.  Pending CT scan of left thigh and abdomen/pelvis 5.  Afebrile and no WBC but immunocompromised 6.  He will be PRN for the weekend so we will see him again on Monday to check progress     LOS: 1 day      DORT, Hall [place] 06/25/2013, 10:10 AM Pager: [phone]

## 2013-06-25 NOTE — Progress Notes (Signed)
INITIAL NUTRITION ASSESSMENT  DOCUMENTATION CODES Per approved criteria  -Severe malnutrition in the context of chronic illness   INTERVENTION: Add Resource Breeze po BID, each supplement provides 250 kcal and 9 grams of protein. Add MVI daily. RD to continue to follow nutrition care plan.  NUTRITION DIAGNOSIS: Increased nutrient needs related to wound healing as evidenced by estimated needs.   Goal: Intake to meet >90% of estimated nutrition needs.  Monitor:  weight trends, lab trends, I/O's, PO intake, supplement tolerance  Reason for Assessment: Malnutrition Screening Tool  58 y.o. male  Admitting Dx: Ulcer of lower limb  ASSESSMENT: PMHx significant for HIV, concurrent TB, stage III Pancoast tumor to RLL. Admitted with wound on L posterior thigh, worsening over the past 3 weeks. Work-up reveals dry gangrene.  Pt is getting hydrotherapy. Pt will need debridement once wound is softer, per surgery.  Pt reports that he is eating really well at this time. Pt notes that he has had poor appetite PTA, eating bites at meal times and only eating twice a day. Cannot tolerate Ensure but will drink Breeze. He notes that he weighed around 175 lb back in November but this is not consistent with EPIC weight history. Per chart, pt with at least 4% wt loss x 3 months.   Nutrition Focused Physical Exam:  Subcutaneous Fat:  Orbital Region: moderate depletion Upper Arm Region: moderate depletion Thoracic and Lumbar Region: n/a  Muscle:  Temple Region: moderate depletion Clavicle Bone Region: severe depletion Clavicle and Acromion Bone Region: moderate depletion Scapular Bone Region: n/a Dorsal Hand: moderate depletoin Patellar Region: severe depletion Anterior Thigh Region: severe depletion Posterior Calf Region: severe depletion  Edema: none  Pt meets criteria for severe MALNUTRITION in the context of chronic illness as evidenced by severe muscle mass loss and intake of <75% x at  least 1 month.   Height: Ht Readings from Last 1 Encounters:  06/24/13 5\' 9"  (1.753 m)    Weight: Wt Readings from Last 1 Encounters:  06/24/13 134 lb 4.8 oz (60.918 kg)    Ideal Body Weight: 160 lb  % Ideal Body Weight: 84%  Wt Readings from Last 10 Encounters:  06/24/13 134 lb 4.8 oz (60.918 kg)  06/24/13 133 lb (60.328 kg)  05/05/13 135 lb (61.236 kg)  04/28/13 133 lb 14.4 oz (60.737 kg)  04/06/13 141 lb 11.2 oz (64.275 kg)  03/22/13 140 lb (63.504 kg)  12/30/12 147 lb 11.2 oz (66.996 kg)  12/23/12 142 lb (64.411 kg)  12/16/12 139 lb 8 oz (63.277 kg)  12/02/12 145 lb 11.2 oz (66.089 kg)    Usual Body Weight: 140 lb  % Usual Body Weight: 96%  BMI:  Body mass index is 19.82 kg/(m^2). Normal weight  Estimated Nutritional Needs: Kcal: 1800 - 2000 Protein: at least 90 g daily Fluid: 1.8 - 2 liters  Skin: L hip wound  Diet Order: General  EDUCATION NEEDS: -No education needs identified at this time  No intake or output data in the 24 hours ending 06/25/13 1117  Last BM: 3/4  Labs:   Recent Labs Lab 06/24/13 1957 06/25/13 0547  NA 137 139  K 4.2 4.4  CL 101 103  CO2 24 23  BUN 18 18  CREATININE 0.81 0.87  CALCIUM 8.4 8.5  GLUCOSE 91 95    CBG (last 3)   Recent Labs  06/25/13 0757  GLUCAP 100*    Scheduled Meds: . carbamazepine  600 mg Oral BID  . collagenase  Topical Daily  . dronabinol  10 mg Oral BID AC  . efavirenz-emtricitabine-tenofovir  1 tablet Oral QHS  . gabapentin  300 mg Oral TID  . heparin  5,000 Units Subcutaneous 3 times per day  . PHENobarbital  64.8 mg Oral QHS  . sulfamethoxazole-trimethoprim  1 tablet Oral Once per day on Mon Wed Fri    Continuous Infusions:   Past Medical History  Diagnosis Date  . 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 HIV infection   . SOB (shortness of breath)   . Chronic pain in right shoulder     scapula and arm for  2 months   . Anxiety     Past Surgical History  Procedure Laterality Date  . Fine needle aspiration      Inda Coke MS, RD, LDN Inpatient Registered Dietitian Pager: [phone] After-hours pager: [phone]

## 2013-06-25 NOTE — Progress Notes (Signed)
    [facility] for Infectious Disease  Date of Admission:  06/24/2013  Antibiotics:  Subjective: No acute events  Objective: Temp:  [98 F (36.7 C)-99.9 F (37.7 C)] 98 F (36.7 C) (03/06 1409) Pulse Rate:  [84-95] 84 (03/06 1409) Resp:  [16-18] 18 (03/06 1409) BP: (102-118)/(64-75) 114/75 mmHg (03/06 1409) SpO2:  [94 %-96 %] 94 % (03/06 1409)  General: Awake, nad Skin: no rash Ext; leg unchanged  Lab Results Lab Results  Component Value Date   WBC 3.7* 06/25/2013   HGB 10.1* 06/25/2013   HCT 28.8* 06/25/2013   MCV 100.3* 06/25/2013   PLT 229 06/25/2013    Lab Results  Component Value Date   CREATININE 0.87 06/25/2013   BUN 18 06/25/2013   NA 139 06/25/2013   K 4.4 06/25/2013   CL 103 06/25/2013   CO2 23 06/25/2013    Lab Results  Component Value Date   ALT 19 06/24/2013   AST 27 06/24/2013   ALKPHOS 88 06/24/2013   BILITOT 0.2* 06/24/2013      Microbiology: Recent Results (from the past 240 hour(s))  MRSA PCR SCREENING     Status: None   Collection Time    06/24/13  6:55 PM      Result Value Ref Range Status   MRSA by PCR NEGATIVE  NEGATIVE Final   Comment:            The GeneXpert MRSA Assay (FDA     approved for NASAL specimens     only), is one component of a     comprehensive MRSA colonization     surveillance program. It is not     intended to diagnose MRSA     infection nor to guide or     monitor treatment for     MRSA infections.    Studies/Results: No results found.  Assessment/Plan: 1) leg mass - awaiting CT scan.  Holding antibiotics pending evaluation with CT, ?biopsy.    Dr. Megan Salon to follow up tomorrow.  Thanks  Scharlene Gloss, [facility] [place] [email or web address] [phone] pager   [phone] cell 06/25/2013, 4:16 PM

## 2013-06-26 LAB — COMPREHENSIVE METABOLIC PANEL
ALBUMIN: 2.6 g/dL — AB (ref 3.5–5.2)
ALT: 15 U/L (ref 0–53)
AST: 18 U/L (ref 0–37)
Alkaline Phosphatase: 87 U/L (ref 39–117)
BUN: 12 mg/dL (ref 6–23)
CHLORIDE: 101 meq/L (ref 96–112)
CO2: 25 mEq/L (ref 19–32)
Calcium: 9.2 mg/dL (ref 8.4–10.5)
Creatinine, Ser: 0.91 mg/dL (ref 0.50–1.35)
GFR calc Af Amer: >90 mL/min (ref >90)
GFR calc non Af Amer: >90 mL/min (ref >90)
Glucose, Bld: 94 mg/dL (ref 70–99)
POTASSIUM: 4.5 meq/L (ref 3.7–5.3)
SODIUM: 137 meq/L (ref 137–147)
Total Protein: 7.4 g/dL (ref 6.0–8.3)

## 2013-06-26 LAB — CBC WITH DIFFERENTIAL/PLATELET
BASOS ABS: 0 10*3/uL (ref 0.0–0.1)
BASOS PCT: 0 % (ref 0–1)
Eosinophils Absolute: 0.1 10*3/uL (ref 0.0–0.7)
Eosinophils Relative: 1 % (ref 0–5)
HCT: 35.6 % — ABNORMAL LOW (ref 39.0–52.0)
Hemoglobin: 12.4 g/dL — ABNORMAL LOW (ref 13.0–17.0)
Lymphocytes Relative: 22 % (ref 12–46)
Lymphs Abs: 0.9 10*3/uL (ref 0.7–4.0)
MCH: 34.7 pg — ABNORMAL HIGH (ref 26.0–34.0)
MCHC: 34.8 g/dL (ref 30.0–36.0)
MCV: 99.7 fL (ref 78.0–100.0)
Monocytes Absolute: 0.6 10*3/uL (ref 0.1–1.0)
Monocytes Relative: 15 % — ABNORMAL HIGH (ref 3–12)
NEUTROS ABS: 2.3 10*3/uL (ref 1.7–7.7)
NEUTROS PCT: 61 % (ref 43–77)
PLATELETS: 275 10*3/uL (ref 150–400)
RBC: 3.57 MIL/uL — ABNORMAL LOW (ref 4.22–5.81)
RDW: 13.6 % (ref 11.5–15.5)
WBC: 3.8 10*3/uL — ABNORMAL LOW (ref 4.0–10.5)

## 2013-06-26 LAB — GLUCOSE, CAPILLARY: Glucose-Capillary: 95 mg/dL (ref 70–99)

## 2013-06-26 MED ORDER — HYDROMORPHONE HCL PF 1 MG/ML IJ SOLN
0.5000 mg | Freq: Once | INTRAMUSCULAR | Status: AC
  Administered 2013-06-26: 0.5 mg via INTRAVENOUS
  Filled 2013-06-26: qty 1

## 2013-06-26 NOTE — Procedures (Signed)
Pre-op Diagnosis: Left posterior thigh necrotic wound Post-op Diagnosis: Left posterior thigh necrotic wound Procedure: Debridement of left posterior thigh wound Surgeons: Coralie Keens PA-C Findings: 2cm x 2cm round are of necrosis which was approximately 1.5cm deep, some dishwater drainage with foul odor Anesthesia: Local with 12cc 1% Lidocaine Fluids: N/A Estimated blood loss: minimal Drains: N/A Specimens: Necrotic tissue from left posterior thigh wound Complications: None Condition: Stable, good hemostasis  Procedure Details: Informed patient of risks (including those of bleeding, infection, and injury to other structures), benefits of procedure, and alternatives to the procedure. All questions were sought and answered. Written and verbal consent given by [name] to proceed with the procedure.  Sterile technique for procedure was done. Injected 12 cc of 1% lidocaine as a field block. With sharp debridement made a simple circular incision with 10 blade around the entire wound to the depth of 1.5cm. Drained 1 mL of purulent/dishwater fluid and probed for loculations.  Foul smell was noted.  No loculations were found.  Removed skin and subcutaneous tissue to the depth of 1.5cm. Continued debridement with forceps and kelly.  The wound bed was debrided back to healthy bleeding tissue.  The subcutaneous cavity was packed with saline soaked gauze, dry dressing, and Kerlix bandage was applied. Instructed nursing staff to continue WD dressing changes BID or more frequently as needed for saturation. Santyl can be applied daily.  Would benefit from continued hydrotherapy to clean any remaining slough.  Tissue cultures and surgical pathology was sent.  Coralie Keens, PA-C [facility] Office: [phone] Pager:  [phone]

## 2013-06-26 NOTE — Progress Notes (Signed)
Patient ID: Roger Walton, male   DOB: [birth year], 58 y.o.   MRN: [id number]         [facility]    Date of Admission:  06/24/2013           Day 1 piperacillin tazobactam  Principal Problem:   Ulcer of lower limb Active Problems:   HIV DISEASE   DERMATOPHYTOSIS, SITE NOS   SEIZURE DISORDER   HX, PERSONAL, TUBERCULOSIS   Lung cancer, upper lobe   Chemotherapy induced neutropenia   Gangrene   . carbamazepine  600 mg Oral BID  . collagenase   Topical Daily  . dronabinol  10 mg Oral BID AC  . efavirenz-emtricitabine-tenofovir  1 tablet Oral QHS  . feeding supplement (RESOURCE BREEZE)  1 Container Oral BID BM  . gabapentin  300 mg Oral TID  . heparin  5,000 Units Subcutaneous 3 times per day  . multivitamin with minerals  1 tablet Oral Daily  . PHENobarbital  64.8 mg Oral QHS  . piperacillin-tazobactam (ZOSYN)  IV  3.375 g Intravenous 3 times per day  . sulfamethoxazole-trimethoprim  1 tablet Oral Once per day on Mon Wed Fri    Subjective: He first noted a small tender knot on his left leg 3 months ago. It has been slowly enlarging since that time.  Objective: Temp:  [98.6 F (37 C)-99.4 F (37.4 C)] 98.6 F (37 C) (03/07 1341) Pulse Rate:  [76-106] 87 (03/07 1341) Resp:  [18-20] 18 (03/07 1341) BP: (106-114)/(67-71) 106/70 mmHg (03/07 1341) SpO2:  [95 %-96 %] 96 % (03/07 1341)  General: He is in no distress No change in ulcer with black eschar on left posterior thigh. Some foul odor     Lab Results Lab Results  Component Value Date   WBC 3.8* 06/26/2013   HGB 12.4* 06/26/2013   HCT 35.6* 06/26/2013   MCV 99.7 06/26/2013   PLT 275 06/26/2013    Lab Results  Component Value Date   CREATININE 0.91 06/26/2013   BUN 12 06/26/2013   NA 137 06/26/2013   K 4.5 06/26/2013   CL 101 06/26/2013   CO2 25 06/26/2013    Lab Results  Component Value Date   ALT 15 06/26/2013   AST 18 06/26/2013   ALKPHOS 87 06/26/2013   BILITOT <0.2* 06/26/2013      HIV 1 RNA  Quant (copies/mL)  Date Value  05/05/2013 82*  12/09/2012 <20   06/29/2012 54*     CD4 T Cell Abs (/uL)  Date Value  05/05/2013 100*  12/09/2012 120*  06/29/2012 160*   Microbiology: Recent Results (from the past 240 hour(s))  MRSA PCR SCREENING     Status: None   Collection Time    06/24/13  6:55 PM      Result Value Ref Range Status   MRSA by PCR NEGATIVE  NEGATIVE Final   Comment:            The GeneXpert MRSA Assay (FDA     approved for NASAL specimens     only), is one component of a     comprehensive MRSA colonization     surveillance program. It is not     intended to diagnose MRSA     infection nor to guide or     monitor treatment for     MRSA infections.    Studies/Results: Ct Abdomen Pelvis W Contrast  06/25/2013   CLINICAL DATA:  History of non-small-cell carcinoma  EXAM: CT ABDOMEN AND PELVIS WITH CONTRAST  TECHNIQUE: Multidetector CT imaging of the abdomen and pelvis was performed using the standard protocol following bolus administration of intravenous contrast.  CONTRAST:  84mL OMNIPAQUE IOHEXOL 300 MG/ML  SOLN  COMPARISON:  04/05/2013  FINDINGS: Lung bases are well aerated without focal infiltrate or sizable effusion. The previously seen parenchymal lesions are not well evaluated on this exam.  The liver is homogeneous in attenuation without evidence of focal mass. The spleen again demonstrates a rounded hypodense lesion measuring 3.2 cm in greatest dimension. This is slightly larger than that seen on the prior exam a which time it measured 2.6 cm however this area showed no significant increased uptake on previous PET-CT and has been stable over multiple previous exams. Continued followup is recommended.  A hypodense lesion within the right adrenal gland is stable. The left adrenal gland is stable in appearance. The pancreas is unremarkable. The kidneys are well visualized and within normal limits. No renal calculi or urinary tract obstructive changes are seen. Small  renal hypodensities are again seen and stable likely representing cysts. Normal excretion of contrast is noted on delayed imaging. The gallbladder is decompressed. No pelvic mass lesion is seen. A rounded hypodense lesion is noted in the posterior left buttock best seen on image number 68. This measures 2.9 cm and is stable from the prior exam. It may represent a sebaceous cyst. Some mild edema is noted in the upper thigh which may be related to the known thigh lesion. Some inguinal lymph nodes are noted on the left. The largest of these measures 14 mm in dimension. These are new from the prior exam. They may be reactive in nature given the ulcerative lesion left thigh. No acute bony abnormality is noted. Soft plaque is noted within the distal aorta which is stable in appearance.  IMPRESSION: Stable changes in the right adrenal gland.  Slight increase in size of a hypodense lesion within the spleen. This previously showed no significant activity on PET-CT seen.  Stable appearing subcutaneous lesion in the posterior left buttock. This may represent a sebaceous cyst.  Lymphadenopathy within the left inguinal region. This may be reactive in nature but may be related to the known ulcerative lesion within the thigh. .   Electronically Signed   By: Inez Catalina M.D.   On: 06/25/2013 16:43   Ct Femur Left W Contrast  06/25/2013   CLINICAL DATA:  Question malignancy. Non-small-cell lung cancer. Ulcerative lesion on back of left thigh.  EXAM: CT OF THE LEFT FEMUR WITH CONTRAST  TECHNIQUE: Multidetector CT imaging of the left femur was performed according to the standard protocol following intravenous contrast administration. Multiplanar CT image reconstructions were also generated.  CONTRAST:  41mL OMNIPAQUE IOHEXOL 300 MG/ML  SOLN  COMPARISON:  CT abdomen and pelvis performed today.  FINDINGS: No focal bony abnormality. No bone lesion. No fracture, subluxation or dislocation.  There is left inguinal adenopathy. Largest  left inguinal lymph node has a short axis diameter of 1.4 cm. Central low-density suggest possibility of necrosis. Other borderline and mildly enlarged left inguinal lymph nodes.  Moderate-sized left hydrocele noted.  There is a large soft tissue defect noted in the posterior soft tissues of the upper left thigh. Gas extends in the subcutaneous soft tissues down to the fascia layer. I see no intramuscular extension. No focal fluid collection.  IMPRESSION: Large subcutaneous soft tissue defect noted posteriorly to the level of the fascia. No drainable fluid collection.  No  acute bony abnormality or focal bone lesion.  Left inguinal adenopathy.   Electronically Signed   By: Rolm Baptise M.D.   On: 06/25/2013 16:55    Assessment: The clinical and radiographic findings suggest a smoldering soft tissue infection. I agree with debridement and submission of tissue for Gram stain, aerobic and anaerobic cultures.  Plan: 1. Bedside debridement 2. Submitted tissue for Gram stain, aerobic and anaerobic cultures 3. Continue current antibiotic therapy  Michel Bickers, MD [facility] [place] [phone] pager   [phone] cell 06/26/2013, 2:13 PM

## 2013-06-26 NOTE — Progress Notes (Addendum)
Physical Therapy Wound Treatment Patient Details  Name: Roger Walton MRN: [id number] Date of Birth: August 19, 1955  Today's Date: 06/26/2013 Time: 6270-3500 Time Calculation (min): 19 min  Subjective     Pain Score: Pain Score: Pt premedicated  Wound Assessment  Wound / Incision (Open or Dehisced) 06/24/13 Other (Comment) Hip Left;Medial;Mid (Active)  Dressing Type Moist to dry;Barrier Film (skin prep);Gauze; Santyl ointment not applied since tube missing - nurse to apply when new tube comes up. 06/26/2013 10:15 AM  Dressing Changed Changed 06/26/2013 10:15 AM  Dressing Status Clean;Dry;Intact 06/26/2013 10:15 AM  Dressing Change Frequency Twice a day 06/26/2013 10:15 AM  Site / Wound Assessment Black 06/26/2013 10:15 AM  % Wound base Red or Granulating 0% 06/26/2013 10:15 AM  % Wound base Yellow 0% 06/26/2013 10:15 AM  % Wound base Black 100% 06/26/2013 10:15 AM  % Wound base Other (Comment) 0% 06/26/2013 10:15 AM  Peri-wound Assessment Induration;Edema 06/26/2013 10:15 AM  Wound Length (cm) 3 cm 06/25/2013  1:41 PM  Wound Width (cm) 3 cm 06/25/2013  1:41 PM  Margins Attached edges (approximated) 06/25/2013  9:00 AM  Drainage Amount Minimal 06/26/2013 10:15 AM  Drainage Description Odor 06/26/2013 10:15 AM  Treatment Debridement (Selective);Hydrotherapy (Pulse lavage);Packing (Saline gauze) 06/26/2013 10:15 AM   Hydrotherapy Pulsed lavage therapy - wound location: lt posterior thigh Pulsed Lavage with Suction (psi): 12 psi Pulsed Lavage with Suction - Normal Saline Used: 1000 mL Pulsed Lavage Tip: Tip with splash shield Selective Debridement Selective Debridement - Location: lt posterior thigh Selective Debridement - Tools Used: Scalpel;Forceps Selective Debridement - Tissue Removed: black eschar; scored eschar with scalpel   Wound Assessment and Plan  Wound Therapy - Assess/Plan/Recommendations Wound Therapy - Clinical Statement: Pt presents to hydrotherapy with necrotic wound that can benefit from  hydrotherapy to decrease eschar and promote wound healing. Hydrotherapy Plan: Debridement;Dressing change;Patient/family education;Pulsatile lavage with suction Wound Therapy - Frequency: 6X / week Wound Therapy - Follow Up Recommendations: Home health RN Wound Plan: See above  Wound Therapy Goals- Improve the function of patient's integumentary system by progressing the wound(s) through the phases of wound healing (inflammation - proliferation - remodeling) by: Decrease Necrotic Tissue - Progress: Progressing toward goal Increase Granulation Tissue - Progress: Progressing toward goal  Goals will be updated until maximal potential achieved or discharge criteria met.  Discharge criteria: when goals achieved, discharge from hospital, MD decision/surgical intervention, no progress towards goals, refusal/missing three consecutive treatments without notification or medical reason.  GP     Roger Walton 06/26/2013, 10:19 AM  [place]

## 2013-06-26 NOTE — Progress Notes (Signed)
General Surgery Note  LOS: 2 days  POD -     Assessment/Plan: 1.  3 cm eshar/wound with some surrounding cellulitis  Foul smelling fluid from wound  On Zosyn/Bactrim >>>>  This will need debridement - will plan to do this at bedside.  2.  HIV 3.  Lung cancer 4.  Seizure disorder 5.  DVT prophylaxis - SQ Heparin 6.  WBC - 3,800 - 06/26/2013   Principal Problem:   Ulcer of lower limb Active Problems:   HIV DISEASE   DERMATOPHYTOSIS, SITE NOS   SEIZURE DISORDER   HX, PERSONAL, TUBERCULOSIS   Lung cancer, upper lobe   Chemotherapy induced neutropenia   Gangrene   Subjective:  Doing okay.  But has pain around wound. Objective:   Filed Vitals:   06/26/13 0551  BP: 110/71  Pulse: 106  Temp: 99.4 F (37.4 C)  Resp: 18     Intake/Output from previous day:  03/06 0701 - 03/07 0700 In: 410 [P.O.:410] Out: 200 [Urine:200]  Intake/Output this shift:      Physical Exam:   General: thin older AA M who is alert and oriented.    HEENT: Normal. Pupils equal. .   Lungs: Clear.   Wound: 3 cm wound with surrounding cellulitis about 2 to 3 cm      Lab Results:    Recent Labs  06/25/13 0547 06/26/13 0845  WBC 3.7* 3.8*  HGB 10.1* 12.4*  HCT 28.8* 35.6*  PLT 229 275    BMET   Recent Labs  06/25/13 0547 06/26/13 0845  NA 139 137  K 4.4 4.5  CL 103 101  CO2 23 25  GLUCOSE 95 94  BUN 18 12  CREATININE 0.87 0.91  CALCIUM 8.5 9.2    PT/INR   Recent Labs  06/24/13 1957  LABPROT 13.6  INR 1.06    ABG  No results found for this basename: PHART, PCO2, PO2, HCO3,  in the last 72 hours   Studies/Results:  Ct Abdomen Pelvis W Contrast  06/25/2013   CLINICAL DATA:  History of non-small-cell carcinoma  EXAM: CT ABDOMEN AND PELVIS WITH CONTRAST  TECHNIQUE: Multidetector CT imaging of the abdomen and pelvis was performed using the standard protocol following bolus administration of intravenous contrast.  CONTRAST:  7mL OMNIPAQUE IOHEXOL 300 MG/ML  SOLN   COMPARISON:  04/05/2013  FINDINGS: Lung bases are well aerated without focal infiltrate or sizable effusion. The previously seen parenchymal lesions are not well evaluated on this exam.  The liver is homogeneous in attenuation without evidence of focal mass. The spleen again demonstrates a rounded hypodense lesion measuring 3.2 cm in greatest dimension. This is slightly larger than that seen on the prior exam a which time it measured 2.6 cm however this area showed no significant increased uptake on previous PET-CT and has been stable over multiple previous exams. Continued followup is recommended.  A hypodense lesion within the right adrenal gland is stable. The left adrenal gland is stable in appearance. The pancreas is unremarkable. The kidneys are well visualized and within normal limits. No renal calculi or urinary tract obstructive changes are seen. Small renal hypodensities are again seen and stable likely representing cysts. Normal excretion of contrast is noted on delayed imaging. The gallbladder is decompressed. No pelvic mass lesion is seen. A rounded hypodense lesion is noted in the posterior left buttock best seen on image number 68. This measures 2.9 cm and is stable from the prior exam. It may represent a sebaceous  cyst. Some mild edema is noted in the upper thigh which may be related to the known thigh lesion. Some inguinal lymph nodes are noted on the left. The largest of these measures 14 mm in dimension. These are new from the prior exam. They may be reactive in nature given the ulcerative lesion left thigh. No acute bony abnormality is noted. Soft plaque is noted within the distal aorta which is stable in appearance.  IMPRESSION: Stable changes in the right adrenal gland.  Slight increase in size of a hypodense lesion within the spleen. This previously showed no significant activity on PET-CT seen.  Stable appearing subcutaneous lesion in the posterior left buttock. This may represent a sebaceous  cyst.  Lymphadenopathy within the left inguinal region. This may be reactive in nature but may be related to the known ulcerative lesion within the thigh. .   Electronically Signed   By: Inez Catalina M.D.   On: 06/25/2013 16:43   Ct Femur Left W Contrast  06/25/2013   CLINICAL DATA:  Question malignancy. Non-small-cell lung cancer. Ulcerative lesion on back of left thigh.  EXAM: CT OF THE LEFT FEMUR WITH CONTRAST  TECHNIQUE: Multidetector CT imaging of the left femur was performed according to the standard protocol following intravenous contrast administration. Multiplanar CT image reconstructions were also generated.  CONTRAST:  46mL OMNIPAQUE IOHEXOL 300 MG/ML  SOLN  COMPARISON:  CT abdomen and pelvis performed today.  FINDINGS: No focal bony abnormality. No bone lesion. No fracture, subluxation or dislocation.  There is left inguinal adenopathy. Largest left inguinal lymph node has a short axis diameter of 1.4 cm. Central low-density suggest possibility of necrosis. Other borderline and mildly enlarged left inguinal lymph nodes.  Moderate-sized left hydrocele noted.  There is a large soft tissue defect noted in the posterior soft tissues of the upper left thigh. Gas extends in the subcutaneous soft tissues down to the fascia layer. I see no intramuscular extension. No focal fluid collection.  IMPRESSION: Large subcutaneous soft tissue defect noted posteriorly to the level of the fascia. No drainable fluid collection.  No acute bony abnormality or focal bone lesion.  Left inguinal adenopathy.   Electronically Signed   By: Rolm Baptise M.D.   On: 06/25/2013 16:55     Anti-infectives:   Anti-infectives   Start     Dose/Rate Route Frequency Ordered Stop   06/25/13 2030  piperacillin-tazobactam (ZOSYN) IVPB 3.375 g     3.375 g 12.5 mL/hr over 240 Minutes Intravenous 3 times per day 06/25/13 1925     06/25/13 0900  sulfamethoxazole-trimethoprim (BACTRIM DS) 800-160 MG per tablet 1 tablet     1 tablet Oral  Once per day on Mon Wed Fri 06/24/13 1631     06/25/13 0900  sulfamethoxazole-trimethoprim (BACTRIM DS) 800-160 MG per tablet 1 tablet  Status:  Discontinued     1 tablet Oral Once per day on Mon Wed Fri 06/24/13 1732 06/24/13 1738   06/24/13 2200  efavirenz-emtricitabine-tenofovir (ATRIPLA) 600-200-300 MG per tablet 1 tablet     1 tablet Oral Daily at bedtime 06/24/13 1630     06/24/13 2200  efavirenz-emtricitabine-tenofovir (ATRIPLA) 600-200-300 MG per tablet 1 tablet  Status:  Discontinued     1 tablet Oral Daily at bedtime 06/24/13 1732 06/24/13 1738      Alphonsa Overall, MD, FACS Pager: [facility] Office: [phone] 06/26/2013

## 2013-06-26 NOTE — Progress Notes (Signed)
Note: This document was prepared with digital dictation and possible smart phrase technology. Any transcriptional errors that result from this process are unintentional.   Roger Walton TWS:[id number] DOB: 25-Jan-1956 DOA: 06/24/2013 PCP: Bobby Rumpf, MD  Brief narrative: 58 y.o ? 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 Mohammed/Dr. Hendrickson/Dr. Tammi Klippel and was on systemic chemotherapy c 5/5 fractions SBRT, 3 fractions SBRT to R Lower lung lesion  He also has a known history of childhood epilepsy from age of about 58 years old and takes his medications  Admitted for lower limb ulcer-which he finally clarified as being two-month duration and was noted to have purulent discharge and foul order emanating from this  Past medical history-As per Problem list Chart reviewed as below- Reviewed  Consultants:  Infectious disease  General surgery  Procedures:  None so far other than CT scans  Antibiotics:  Zosyn 3/6   Subjective  Doing ok [place] Purulence coming out of wound on exam [seen with Dr. Lucia Gaskins   Objective    Interim History: None  Telemetry: Sinus   Objective: Filed Vitals:   06/25/13 1409 06/25/13 2106 06/26/13 0551 06/26/13 1341  BP: 114/75 114/67 110/71 106/70  Pulse: 84 76 106 87  Temp: 98 F (36.7 C) 99.4 F (37.4 C) 99.4 F (37.4 C) 98.6 F (37 C)  TempSrc: Oral Oral Oral Oral  Resp: 18 20 18 18   Height:      Weight:      SpO2: 94% 95% 96% 96%    Intake/Output Summary (Last 24 hours) at 06/26/13 1409 Last data filed at 06/26/13 0654  Gross per 24 hour  Intake    410 ml  Output    200 ml  Net    210 ml    Exam:  General: EOMI, NCAT Cardiovascular: S1-S2 no murmur rub or gallop Respiratory: Clinically clear Abdomen: Soft nontender  nondistended Skin wound exam as per picture taken by Dr. Lucia Gaskins today Neuro grossly intact  Data Reviewed: Basic Metabolic Panel:  Recent Labs Lab 06/24/13 1957 06/25/13 0547 06/26/13 0845  NA 137 139 137  K 4.2 4.4 4.5  CL 101 103 101  CO2 24 23 25   GLUCOSE 91 95 94  BUN 18 18 12   CREATININE 0.81 0.87 0.91  CALCIUM 8.4 8.5 9.2   Liver Function Tests:  Recent Labs Lab 06/24/13 1957 06/26/13 0845  AST 27 18  ALT 19 15  ALKPHOS 88 87  BILITOT 0.2* <0.2*  PROT 6.6 7.4  ALBUMIN 2.6* 2.6*   No results found for this basename: LIPASE, AMYLASE,  in the last 168 hours No results found for this basename: AMMONIA,  in the last 168 hours CBC:  Recent Labs Lab 06/24/13 1957 06/25/13 0547 06/26/13 0845  WBC 5.4 3.7* 3.8*  NEUTROABS 4.0  --  2.3  HGB 10.9* 10.1* 12.4*  HCT 31.2* 28.8* 35.6*  MCV 98.7 100.3* 99.7  PLT 244 229 275   Cardiac Enzymes: No results found for this basename: CKTOTAL, CKMB, CKMBINDEX, TROPONINI,  in the last 168 hours BNP: No components found with this basename: POCBNP,  CBG:  Recent Labs Lab 06/25/13 0757 06/26/13 0810  GLUCAP 100* 95    Recent Results (from the past 240 hour(s))  MRSA PCR SCREENING     Status: None  Collection Time    06/24/13  6:55 PM      Result Value Ref Range Status   MRSA by PCR NEGATIVE  NEGATIVE Final   Comment:            The GeneXpert MRSA Assay (FDA     approved for NASAL specimens     only), is one component of a     comprehensive MRSA colonization     surveillance program. It is not     intended to diagnose MRSA     infection nor to guide or     monitor treatment for     MRSA infections.     Studies:              All Imaging reviewed and is as per above notation   Scheduled Meds: . carbamazepine  600 mg Oral BID  . collagenase   Topical Daily  . dronabinol  10 mg Oral BID AC  . efavirenz-emtricitabine-tenofovir  1 tablet Oral QHS  . feeding supplement (RESOURCE BREEZE)  1 Container Oral  BID BM  . gabapentin  300 mg Oral TID  . heparin  5,000 Units Subcutaneous 3 times per day  . multivitamin with minerals  1 tablet Oral Daily  . PHENobarbital  64.8 mg Oral QHS  . piperacillin-tazobactam (ZOSYN)  IV  3.375 g Intravenous 3 times per day  . sulfamethoxazole-trimethoprim  1 tablet Oral Once per day on Mon Wed Fri   Continuous Infusions:    Assessment/Plan:  Ulcer of lower limb-Infectious  Mild leukopenia-CT scan lower extremity shows gas.  Empiric Zosyn started 3/6.  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

## 2013-06-27 DIAGNOSIS — L97109 Non-pressure chronic ulcer of unspecified thigh with unspecified severity: Principal | ICD-10-CM

## 2013-06-27 LAB — CBC WITH DIFFERENTIAL/PLATELET
Basophils Absolute: 0 10*3/uL (ref 0.0–0.1)
Basophils Relative: 1 % (ref 0–1)
EOS ABS: 0 10*3/uL (ref 0.0–0.7)
EOS PCT: 1 % (ref 0–5)
HEMATOCRIT: 31 % — AB (ref 39.0–52.0)
Hemoglobin: 10.8 g/dL — ABNORMAL LOW (ref 13.0–17.0)
LYMPHS ABS: 1 10*3/uL (ref 0.7–4.0)
Lymphocytes Relative: 29 % (ref 12–46)
MCH: 34.8 pg — AB (ref 26.0–34.0)
MCHC: 34.8 g/dL (ref 30.0–36.0)
MCV: 100 fL (ref 78.0–100.0)
MONOS PCT: 20 % — AB (ref 3–12)
Monocytes Absolute: 0.7 10*3/uL (ref 0.1–1.0)
Neutro Abs: 1.6 10*3/uL — ABNORMAL LOW (ref 1.7–7.7)
Neutrophils Relative %: 49 % (ref 43–77)
Platelets: 265 10*3/uL (ref 150–400)
RBC: 3.1 MIL/uL — AB (ref 4.22–5.81)
RDW: 13.9 % (ref 11.5–15.5)
WBC: 3.4 10*3/uL — AB (ref 4.0–10.5)

## 2013-06-27 LAB — GLUCOSE, CAPILLARY: Glucose-Capillary: 99 mg/dL (ref 70–99)

## 2013-06-27 NOTE — Progress Notes (Signed)
Note: This document was prepared with digital dictation and possible smart phrase technology. Any transcriptional errors that result from this process are unintentional.   Roger Walton:[id number] DOB: 1955/11/15 DOA: 06/24/2013 PCP: Bobby Rumpf, MD  Brief narrative: 58 y.o ? 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 Mohammed/Dr. Hendrickson/Dr. Tammi Klippel and was on systemic chemotherapy c 5/5 fractions SBRT, 3 fractions SBRT to R Lower lung lesion  He also has a known history of childhood epilepsy from age of about 58 years old and takes his medications  Admitted for lower limb ulcer-which he finally clarified as being two-month duration and was noted to have purulent discharge and foul order emanating from this ID was consulted Gen surg was consulted and debrided wound bedside 3/6.    Past medical history-As per Problem list Chart reviewed as below- Reviewed  Consultants:  Infectious disease  General surgery  Procedures:  None so far other than CT scans  Antibiotics:  Zosyn 3/6   Subjective  Seems to be doing better Tolerating diet, no fever no chills, Wound dressing changed at 6:30 this morning therefore we'll not disturbed No dysphagia, no other issues at doesn't get Pain is moderately controlled    Objective    Interim History: Low grade temps noted   Telemetry: Sinus   Objective: Filed Vitals:   06/26/13 0551 06/26/13 1341 06/26/13 2117 06/27/13 0442  BP: 110/71 106/70 115/75 99/68  Pulse: 106 87 99 95  Temp: 99.4 F (37.4 C) 98.6 F (37 C) 99.8 F (37.7 C) 99.1 F (37.3 C)  TempSrc: Oral Oral Oral Oral  Resp: 18 18 18 18   Height:      Weight:      SpO2: 96% 96% 95% 96%    Intake/Output Summary (Last 24 hours) at 06/27/13 1234 Last data filed at  06/27/13 1150  Gross per 24 hour  Intake    460 ml  Output   1050 ml  Net   -590 ml    Exam:  General: EOMI, NCAT Cardiovascular: S1-S2 no murmur rub or gallop Respiratory: Clinically clear Abdomen: Soft nontender nondistended Skin wound exam as per picture taken by Dr. Lucia Gaskins today will Neuro grossly intact  Data Reviewed: Basic Metabolic Panel:  Recent Labs Lab 06/24/13 1957 06/25/13 0547 06/26/13 0845  NA 137 139 137  K 4.2 4.4 4.5  CL 101 103 101  CO2 24 23 25   GLUCOSE 91 95 94  BUN 18 18 12   CREATININE 0.81 0.87 0.91  CALCIUM 8.4 8.5 9.2   Liver Function Tests:  Recent Labs Lab 06/24/13 1957 06/26/13 0845  AST 27 18  ALT 19 15  ALKPHOS 88 87  BILITOT 0.2* <0.2*  PROT 6.6 7.4  ALBUMIN 2.6* 2.6*   No results found for this basename: LIPASE, AMYLASE,  in the last 168 hours No results found for this basename: AMMONIA,  in the last 168 hours CBC:  Recent Labs Lab 06/24/13 1957 06/25/13 0547 06/26/13 0845 06/27/13 0635  WBC 5.4 3.7* 3.8* 3.4*  NEUTROABS 4.0  --  2.3 1.6*  HGB 10.9* 10.1* 12.4* 10.8*  HCT 31.2* 28.8* 35.6* 31.0*  MCV 98.7 100.3* 99.7 100.0  PLT 244 229 275 265   Cardiac Enzymes: No results found for this basename: CKTOTAL, CKMB, CKMBINDEX, TROPONINI,  in the last  168 hours BNP: No components found with this basename: POCBNP,  CBG:  Recent Labs Lab 06/25/13 0757 06/26/13 0810 06/27/13 0646  GLUCAP 100* 95 99    Recent Results (from the past 240 hour(s))  MRSA PCR SCREENING     Status: None   Collection Time    06/24/13  6:55 PM      Result Value Ref Range Status   MRSA by PCR NEGATIVE  NEGATIVE Final   Comment:            The GeneXpert MRSA Assay (FDA     approved for NASAL specimens     only), is one component of a     comprehensive MRSA colonization     surveillance program. It is not     intended to diagnose MRSA     infection nor to guide or     monitor treatment for     MRSA infections.  TISSUE CULTURE      Status: None   Collection Time    06/26/13  2:53 PM      Result Value Ref Range Status   Specimen Description TISSUE LEFT THIGH   Final   Special Requests NONE   Final   Gram Stain     Final   Value: NO WBC SEEN     NO SQUAMOUS EPITHELIAL CELLS SEEN     FEW GRAM NEGATIVE RODS     Performed at [facility]   Culture     Final   Value: NO GROWTH 1 DAY     Performed at [facility]   Report Status PENDING   Incomplete  ANAEROBIC CULTURE     Status: None   Collection Time    06/26/13  2:53 PM      Result Value Ref Range Status   Specimen Description TISSUE LEFT THIGH   Final   Special Requests NONE   Final   Gram Stain     Final   Value: NO WBC SEEN     NO SQUAMOUS EPITHELIAL CELLS SEEN     FEW GRAM NEGATIVE RODS     Performed at [facility]   Culture     Final   Value: NO ANAEROBES ISOLATED; CULTURE IN PROGRESS FOR 5 DAYS     Performed at [facility]   Report Status PENDING   Incomplete     Studies:              All Imaging reviewed and is as per above notation   Scheduled Meds: . carbamazepine  600 mg Oral BID  . collagenase   Topical Daily  . dronabinol  10 mg Oral BID AC  . efavirenz-emtricitabine-tenofovir  1 tablet Oral QHS  . feeding supplement (RESOURCE BREEZE)  1 Container Oral BID BM  . gabapentin  300 mg Oral TID  . heparin  5,000 Units Subcutaneous 3 times per day  . multivitamin with minerals  1 tablet Oral Daily  . PHENobarbital  64.8 mg Oral QHS  . piperacillin-tazobactam (ZOSYN)  IV  3.375 g Intravenous 3 times per day  . sulfamethoxazole-trimethoprim  1 tablet Oral Once per day on Mon Wed Fri   Continuous Infusions:    Assessment/Plan:   Ulcer of lower limb-Infectious  Mild leukopenia-CT scan lower extremity shows gas.  Empiric Zosyn started 3/6-Continue IV coverage until wound cult resulted-As ID on board, will defer choice Abx to them as OP HIV DISEASE-will defer to infectious disease  DERMATOPHYTOSIS, SITE  NOS    SEIZURE DISORDER-continue home medications of Tegretol 600 twice a day-please note that this is a CYP450 enzyme inducer and I am not sure if this would interact with his HIV medications. 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  Await wound cultures   Verneita Griffes, MD  [facility] Pager [phone] 06/27/2013, 12:34 PM    LOS: 3 days

## 2013-06-27 NOTE — Progress Notes (Signed)
Patient ID: Roger Walton, male   DOB: [birth year], 58 y.o.   MRN: [id number]         [facility] for Infectious Disease    Date of Admission:  06/24/2013           Day 2 piperacillin tazobactam  Principal Problem:   Ulcer of lower limb Active Problems:   HIV DISEASE   DERMATOPHYTOSIS, SITE NOS   SEIZURE DISORDER   HX, PERSONAL, TUBERCULOSIS   Lung cancer, upper lobe   Chemotherapy induced neutropenia   Gangrene   . carbamazepine  600 mg Oral BID  . collagenase   Topical Daily  . dronabinol  10 mg Oral BID AC  . efavirenz-emtricitabine-tenofovir  1 tablet Oral QHS  . feeding supplement (RESOURCE BREEZE)  1 Container Oral BID BM  . gabapentin  300 mg Oral TID  . heparin  5,000 Units Subcutaneous 3 times per day  . multivitamin with minerals  1 tablet Oral Daily  . PHENobarbital  64.8 mg Oral QHS  . piperacillin-tazobactam (ZOSYN)  IV  3.375 g Intravenous 3 times per day  . sulfamethoxazole-trimethoprim  1 tablet Oral Once per day on Mon Wed Fri    Subjective: Left leg is sore.  Objective: Temp:  [98.9 F (37.2 C)-99.8 F (37.7 C)] 98.9 F (37.2 C) (03/08 1300) Pulse Rate:  [87-99] 87 (03/08 1300) Resp:  [18] 18 (03/08 1300) BP: (99-115)/(68-75) 110/75 mmHg (03/08 1300) SpO2:  [95 %-97 %] 97 % (03/08 1300)  General: He is in no distress up walking in his room His left thigh wound is much cleaner after yesterday's debridement  Lab Results Lab Results  Component Value Date   WBC 3.4* 06/27/2013   HGB 10.8* 06/27/2013   HCT 31.0* 06/27/2013   MCV 100.0 06/27/2013   PLT 265 06/27/2013    Lab Results  Component Value Date   CREATININE 0.91 06/26/2013   BUN 12 06/26/2013   NA 137 06/26/2013   K 4.5 06/26/2013   CL 101 06/26/2013   CO2 25 06/26/2013    Lab Results  Component Value Date   ALT 15 06/26/2013   AST 18 06/26/2013   ALKPHOS 87 06/26/2013   BILITOT <0.2* 06/26/2013      HIV 1 RNA Quant (copies/mL)  Date Value  05/05/2013 82*  12/09/2012 <20   06/29/2012 54*     CD4  T Cell Abs (/uL)  Date Value  05/05/2013 100*  12/09/2012 120*  06/29/2012 160*   Microbiology: Recent Results (from the past 240 hour(s))  MRSA PCR SCREENING     Status: None   Collection Time    06/24/13  6:55 PM      Result Value Ref Range Status   MRSA by PCR NEGATIVE  NEGATIVE Final   Comment:            The GeneXpert MRSA Assay (FDA     approved for NASAL specimens     only), is one component of a     comprehensive MRSA colonization     surveillance program. It is not     intended to diagnose MRSA     infection nor to guide or     monitor treatment for     MRSA infections.  TISSUE CULTURE     Status: None   Collection Time    06/26/13  2:53 PM      Result Value Ref Range Status   Specimen Description TISSUE LEFT THIGH   Final  Special Requests NONE   Final   Gram Stain     Final   Value: NO WBC SEEN     NO SQUAMOUS EPITHELIAL CELLS SEEN     FEW GRAM NEGATIVE RODS     Performed at [facility]   Culture     Final   Value: NO GROWTH 1 DAY     Performed at [facility]   Report Status PENDING   Incomplete  ANAEROBIC CULTURE     Status: None   Collection Time    06/26/13  2:53 PM      Result Value Ref Range Status   Specimen Description TISSUE LEFT THIGH   Final   Special Requests NONE   Final   Gram Stain     Final   Value: NO WBC SEEN     NO SQUAMOUS EPITHELIAL CELLS SEEN     FEW GRAM NEGATIVE RODS     Performed at [facility]   Culture     Final   Value: NO ANAEROBES ISOLATED; CULTURE IN PROGRESS FOR 5 DAYS     Performed at [facility]   Report Status PENDING   Incomplete    Studies/Results: Ct Abdomen Pelvis W Contrast  06/25/2013   CLINICAL DATA:  History of non-small-cell carcinoma  EXAM: CT ABDOMEN AND PELVIS WITH CONTRAST  TECHNIQUE: Multidetector CT imaging of the abdomen and pelvis was performed using the standard protocol following bolus administration of intravenous contrast.  CONTRAST:  76mL OMNIPAQUE IOHEXOL 300  MG/ML  SOLN  COMPARISON:  04/05/2013  FINDINGS: Lung bases are well aerated without focal infiltrate or sizable effusion. The previously seen parenchymal lesions are not well evaluated on this exam.  The liver is homogeneous in attenuation without evidence of focal mass. The spleen again demonstrates a rounded hypodense lesion measuring 3.2 cm in greatest dimension. This is slightly larger than that seen on the prior exam a which time it measured 2.6 cm however this area showed no significant increased uptake on previous PET-CT and has been stable over multiple previous exams. Continued followup is recommended.  A hypodense lesion within the right adrenal gland is stable. The left adrenal gland is stable in appearance. The pancreas is unremarkable. The kidneys are well visualized and within normal limits. No renal calculi or urinary tract obstructive changes are seen. Small renal hypodensities are again seen and stable likely representing cysts. Normal excretion of contrast is noted on delayed imaging. The gallbladder is decompressed. No pelvic mass lesion is seen. A rounded hypodense lesion is noted in the posterior left buttock best seen on image number 68. This measures 2.9 cm and is stable from the prior exam. It may represent a sebaceous cyst. Some mild edema is noted in the upper thigh which may be related to the known thigh lesion. Some inguinal lymph nodes are noted on the left. The largest of these measures 14 mm in dimension. These are new from the prior exam. They may be reactive in nature given the ulcerative lesion left thigh. No acute bony abnormality is noted. Soft plaque is noted within the distal aorta which is stable in appearance.  IMPRESSION: Stable changes in the right adrenal gland.  Slight increase in size of a hypodense lesion within the spleen. This previously showed no significant activity on PET-CT seen.  Stable appearing subcutaneous lesion in the posterior left buttock. This may  represent a sebaceous cyst.  Lymphadenopathy within the left inguinal region. This may be reactive  in nature but may be related to the known ulcerative lesion within the thigh. .   Electronically Signed   By: Inez Catalina M.D.   On: 06/25/2013 16:43   Ct Femur Left W Contrast  06/25/2013   CLINICAL DATA:  Question malignancy. Non-small-cell lung cancer. Ulcerative lesion on back of left thigh.  EXAM: CT OF THE LEFT FEMUR WITH CONTRAST  TECHNIQUE: Multidetector CT imaging of the left femur was performed according to the standard protocol following intravenous contrast administration. Multiplanar CT image reconstructions were also generated.  CONTRAST:  47mL OMNIPAQUE IOHEXOL 300 MG/ML  SOLN  COMPARISON:  CT abdomen and pelvis performed today.  FINDINGS: No focal bony abnormality. No bone lesion. No fracture, subluxation or dislocation.  There is left inguinal adenopathy. Largest left inguinal lymph node has a short axis diameter of 1.4 cm. Central low-density suggest possibility of necrosis. Other borderline and mildly enlarged left inguinal lymph nodes.  Moderate-sized left hydrocele noted.  There is a large soft tissue defect noted in the posterior soft tissues of the upper left thigh. Gas extends in the subcutaneous soft tissues down to the fascia layer. I see no intramuscular extension. No focal fluid collection.  IMPRESSION: Large subcutaneous soft tissue defect noted posteriorly to the level of the fascia. No drainable fluid collection.  No acute bony abnormality or focal bone lesion.  Left inguinal adenopathy.   Electronically Signed   By: Rolm Baptise M.D.   On: 06/25/2013 16:55    Assessment: Tissue Gram stain reveals gram-negative rods and cultures are pending.  Plan: 1. Continue current antibiotic therapy pending final cultures  Michel Bickers, MD [facility] for Infectious [place] [phone] pager   [phone] cell 06/27/2013, 1:41 PM

## 2013-06-27 NOTE — Progress Notes (Signed)
Subjective: Feels better overall.  Pain to posterior left thigh.  Tolerating dressing changes.  Eating well, mobilizing well.    Objective: Vital signs in last 24 hours: Temp:  [98.6 F (37 C)-99.8 F (37.7 C)] 99.1 F (37.3 C) (03/08 0442) Pulse Rate:  [87-99] 95 (03/08 0442) Resp:  [18] 18 (03/08 0442) BP: (99-115)/(68-75) 99/68 mmHg (03/08 0442) SpO2:  [95 %-96 %] 96 % (03/08 0442) Last BM Date: 06/26/13  Intake/Output from previous day: 03/07 0701 - 03/08 0700 In: 100 [IV Piggyback:100] Out: 550 [Urine:550] Intake/Output this shift:    PE: Gen:  Alert, NAD, pleasant Ext:  Left posterior thigh wound-2cm x 1.75cm with 1.5cm depth, new yellow/tan slough at wound bed but no further necrosis in need of debridement, bleeding noted, induration about 1.5cm around open wound.  Tolerated dressing change well.  (picture from 06/26/13 after debridement)   Lab Results:   Recent Labs  06/26/13 0845 06/27/13 0635  WBC 3.8* 3.4*  HGB 12.4* 10.8*  HCT 35.6* 31.0*  PLT 275 265   BMET  Recent Labs  06/25/13 0547 06/26/13 0845  NA 139 137  K 4.4 4.5  CL 103 101  CO2 23 25  GLUCOSE 95 94  BUN 18 12  CREATININE 0.87 0.91  CALCIUM 8.5 9.2   PT/INR  Recent Labs  06/24/13 1957  LABPROT 13.6  INR 1.06   CMP     Component Value Date/Time   NA 137 06/26/2013 0845   NA 136 04/06/2013 0914   K 4.5 06/26/2013 0845   K 4.1 04/06/2013 0914   CL 101 06/26/2013 0845   CL 103 08/24/2012 1129   CO2 25 06/26/2013 0845   CO2 26 04/06/2013 0914   GLUCOSE 94 06/26/2013 0845   GLUCOSE 126 04/06/2013 0914   GLUCOSE 103* 08/24/2012 1129   BUN 12 06/26/2013 0845   BUN 9.8 04/06/2013 0914   CREATININE 0.91 06/26/2013 0845   CREATININE 0.95 05/05/2013 1530   CREATININE 1.0 04/06/2013 0914   CALCIUM 9.2 06/26/2013 0845   CALCIUM 9.2 04/06/2013 0914   PROT 7.4 06/26/2013 0845   PROT 7.7 04/06/2013 0914   ALBUMIN 2.6* 06/26/2013 0845   ALBUMIN 3.4* 04/06/2013 0914   AST 18 06/26/2013 0845   AST 20  04/06/2013 0914   ALT 15 06/26/2013 0845   ALT 13 04/06/2013 0914   ALKPHOS 87 06/26/2013 0845   ALKPHOS 94 04/06/2013 0914   BILITOT <0.2* 06/26/2013 0845   BILITOT 0.37 04/06/2013 0914   GFRNONAA >90 06/26/2013 0845   GFRAA >90 06/26/2013 0845   Lipase  No results found for this basename: lipase       Studies/Results: Ct Abdomen Pelvis W Contrast  06/25/2013   CLINICAL DATA:  History of non-small-cell carcinoma  EXAM: CT ABDOMEN AND PELVIS WITH CONTRAST  TECHNIQUE: Multidetector CT imaging of the abdomen and pelvis was performed using the standard protocol following bolus administration of intravenous contrast.  CONTRAST:  99mL OMNIPAQUE IOHEXOL 300 MG/ML  SOLN  COMPARISON:  04/05/2013  FINDINGS: Lung bases are well aerated without focal infiltrate or sizable effusion. The previously seen parenchymal lesions are not well evaluated on this exam.  The liver is homogeneous in attenuation without evidence of focal mass. The spleen again demonstrates a rounded hypodense lesion measuring 3.2 cm in greatest dimension. This is slightly larger than that seen on the prior exam a which time it measured 2.6 cm however this area showed no significant increased uptake on previous PET-CT and has been  stable over multiple previous exams. Continued followup is recommended.  A hypodense lesion within the right adrenal gland is stable. The left adrenal gland is stable in appearance. The pancreas is unremarkable. The kidneys are well visualized and within normal limits. No renal calculi or urinary tract obstructive changes are seen. Small renal hypodensities are again seen and stable likely representing cysts. Normal excretion of contrast is noted on delayed imaging. The gallbladder is decompressed. No pelvic mass lesion is seen. A rounded hypodense lesion is noted in the posterior left buttock best seen on image number 68. This measures 2.9 cm and is stable from the prior exam. It may represent a sebaceous cyst. Some mild  edema is noted in the upper thigh which may be related to the known thigh lesion. Some inguinal lymph nodes are noted on the left. The largest of these measures 14 mm in dimension. These are new from the prior exam. They may be reactive in nature given the ulcerative lesion left thigh. No acute bony abnormality is noted. Soft plaque is noted within the distal aorta which is stable in appearance.  IMPRESSION: Stable changes in the right adrenal gland.  Slight increase in size of a hypodense lesion within the spleen. This previously showed no significant activity on PET-CT seen.  Stable appearing subcutaneous lesion in the posterior left buttock. This may represent a sebaceous cyst.  Lymphadenopathy within the left inguinal region. This may be reactive in nature but may be related to the known ulcerative lesion within the thigh. .   Electronically Signed   By: Inez Catalina M.D.   On: 06/25/2013 16:43   Ct Femur Left W Contrast  06/25/2013   CLINICAL DATA:  Question malignancy. Non-small-cell lung cancer. Ulcerative lesion on back of left thigh.  EXAM: CT OF THE LEFT FEMUR WITH CONTRAST  TECHNIQUE: Multidetector CT imaging of the left femur was performed according to the standard protocol following intravenous contrast administration. Multiplanar CT image reconstructions were also generated.  CONTRAST:  3mL OMNIPAQUE IOHEXOL 300 MG/ML  SOLN  COMPARISON:  CT abdomen and pelvis performed today.  FINDINGS: No focal bony abnormality. No bone lesion. No fracture, subluxation or dislocation.  There is left inguinal adenopathy. Largest left inguinal lymph node has a short axis diameter of 1.4 cm. Central low-density suggest possibility of necrosis. Other borderline and mildly enlarged left inguinal lymph nodes.  Moderate-sized left hydrocele noted.  There is a large soft tissue defect noted in the posterior soft tissues of the upper left thigh. Gas extends in the subcutaneous soft tissues down to the fascia layer. I see no  intramuscular extension. No focal fluid collection.  IMPRESSION: Large subcutaneous soft tissue defect noted posteriorly to the level of the fascia. No drainable fluid collection.  No acute bony abnormality or focal bone lesion.  Left inguinal adenopathy.   Electronically Signed   By: Rolm Baptise M.D.   On: 06/25/2013 16:55    Anti-infectives: Anti-infectives   Start     Dose/Rate Route Frequency Ordered Stop   06/25/13 2030  piperacillin-tazobactam (ZOSYN) IVPB 3.375 g     3.375 g 12.5 mL/hr over 240 Minutes Intravenous 3 times per day 06/25/13 1925     06/25/13 0900  sulfamethoxazole-trimethoprim (BACTRIM DS) 800-160 MG per tablet 1 tablet     1 tablet Oral Once per day on Mon Wed Fri 06/24/13 1631     06/25/13 0900  sulfamethoxazole-trimethoprim (BACTRIM DS) 800-160 MG per tablet 1 tablet  Status:  Discontinued  1 tablet Oral Once per day on Mon Wed Fri 06/24/13 1732 06/24/13 1738   06/24/13 2200  efavirenz-emtricitabine-tenofovir (ATRIPLA) 600-200-300 MG per tablet 1 tablet     1 tablet Oral Daily at bedtime 06/24/13 1630     06/24/13 2200  efavirenz-emtricitabine-tenofovir (ATRIPLA) 600-200-300 MG per tablet 1 tablet  Status:  Discontinued     1 tablet Oral Daily at bedtime 06/24/13 1732 06/24/13 1738     Assessment/Plan 1. Posterior, medial left thigh wound POD #1 s/p bedside debridement  2. HIV  3. Lung Cancer  4. Seizure disorder   Plan:  1. Patient feeling a little better after debridement yesterday, tolerating dressing changes well 2. TID WD dressing changes and apply santyl daily 3. Continue Hydrotherapy, and antibiotics (Zosyn and Bactrim Day 3) 4.  Afebrile and no WBC but immunocompromised  5. Hopefully home soon with [facility] care? and will need wound care at wound center.  Upon discharge will need BID WD dressing changes 6. We will see him again on Monday   LOS: 3 days    [place], [facility] 06/27/2013, 7:44 AM Pager: [phone]  Agree with above. Overall wound is  better.  Alphonsa Overall, MD, [facility] Pager: [phone] Office phone:  [phone]

## 2013-06-28 MED ORDER — AMOXICILLIN-POT CLAVULANATE 875-125 MG PO TABS: 1.0000 | ORAL_TABLET | Freq: Two times a day (BID) | ORAL | Status: DC

## 2013-06-28 MED ORDER — DOXYCYCLINE HYCLATE 100 MG PO TABS: 100.0000 mg | ORAL_TABLET | Freq: Two times a day (BID) | ORAL | Status: DC

## 2013-06-28 MED ORDER — COLLAGENASE 250 UNIT/GM EX OINT: TOPICAL_OINTMENT | Freq: Every day | CUTANEOUS | Status: DC

## 2013-06-28 MED ORDER — AMOXICILLIN-POT CLAVULANATE 875-125 MG PO TABS
1.0000 | ORAL_TABLET | Freq: Two times a day (BID) | ORAL | Status: DC
  Administered 2013-06-28: 1 via ORAL
  Filled 2013-06-28 (×2): qty 1

## 2013-06-28 MED ORDER — OXYCODONE HCL 5 MG PO TABS: 5.0000 mg | ORAL_TABLET | ORAL | Status: DC | PRN

## 2013-06-28 MED ORDER — DOXYCYCLINE HYCLATE 100 MG PO TABS
100.0000 mg | ORAL_TABLET | Freq: Two times a day (BID) | ORAL | Status: DC
  Administered 2013-06-28: 100 mg via ORAL
  Filled 2013-06-28 (×2): qty 1

## 2013-06-28 NOTE — Progress Notes (Signed)
Subjective: Pt doing well.  Tolerating packing changes.  Feels better.  Tolerating diet.  Hasn't walked through the halls yet.    Objective: Vital signs in last 24 hours: Temp:  [98.5 F (36.9 C)-99 F (37.2 C)] 98.5 F (36.9 C) (03/09 0459) Pulse Rate:  [69-87] 82 (03/09 0459) Resp:  [18] 18 (03/09 0459) BP: (105-110)/(60-75) 105/71 mmHg (03/09 0459) SpO2:  [95 %-98 %] 95 % (03/09 0459) Last BM Date: 06/27/13  Intake/Output from previous day: 03/08 0701 - 03/09 0700 In: 460 [P.O.:360; IV Piggyback:100] Out: 1450 [Urine:1450] Intake/Output this shift:    PE: Gen: Alert, NAD, pleasant  Ext: Left posterior thigh wound-2cm x 1.75cm with 1.5cm depth, less yellow/tan slough at wound bed, induration about 2cm around open wound and ring of necrosis at wound edges.  Tolerated dressing change and hydrotherapy well.     Lab Results:   Recent Labs  06/26/13 0845 06/27/13 0635  WBC 3.8* 3.4*  HGB 12.4* 10.8*  HCT 35.6* 31.0*  PLT 275 265   BMET  Recent Labs  06/26/13 0845  NA 137  K 4.5  CL 101  CO2 25  GLUCOSE 94  BUN 12  CREATININE 0.91  CALCIUM 9.2   PT/INR No results found for this basename: LABPROT, INR,  in the last 72 hours CMP     Component Value Date/Time   NA 137 06/26/2013 0845   NA 136 04/06/2013 0914   K 4.5 06/26/2013 0845   K 4.1 04/06/2013 0914   CL 101 06/26/2013 0845   CL 103 08/24/2012 1129   CO2 25 06/26/2013 0845   CO2 26 04/06/2013 0914   GLUCOSE 94 06/26/2013 0845   GLUCOSE 126 04/06/2013 0914   GLUCOSE 103* 08/24/2012 1129   BUN 12 06/26/2013 0845   BUN 9.8 04/06/2013 0914   CREATININE 0.91 06/26/2013 0845   CREATININE 0.95 05/05/2013 1530   CREATININE 1.0 04/06/2013 0914   CALCIUM 9.2 06/26/2013 0845   CALCIUM 9.2 04/06/2013 0914   PROT 7.4 06/26/2013 0845   PROT 7.7 04/06/2013 0914   ALBUMIN 2.6* 06/26/2013 0845   ALBUMIN 3.4* 04/06/2013 0914   AST 18 06/26/2013 0845   AST 20 04/06/2013 0914   ALT 15 06/26/2013 0845   ALT 13 04/06/2013 0914   ALKPHOS 87 06/26/2013 0845   ALKPHOS 94 04/06/2013 0914   BILITOT <0.2* 06/26/2013 0845   BILITOT 0.37 04/06/2013 0914   GFRNONAA >90 06/26/2013 0845   GFRAA >90 06/26/2013 0845   Lipase  No results found for this basename: lipase       Studies/Results: No results found.  Anti-infectives: Anti-infectives   Start     Dose/Rate Route Frequency Ordered Stop   06/25/13 2030  piperacillin-tazobactam (ZOSYN) IVPB 3.375 g     3.375 g 12.5 mL/hr over 240 Minutes Intravenous 3 times per day 06/25/13 1925     06/25/13 0900  sulfamethoxazole-trimethoprim (BACTRIM DS) 800-160 MG per tablet 1 tablet     1 tablet Oral Once per day on Mon Wed Fri 06/24/13 1631     06/25/13 0900  sulfamethoxazole-trimethoprim (BACTRIM DS) 800-160 MG per tablet 1 tablet  Status:  Discontinued     1 tablet Oral Once per day on Mon Wed Fri 06/24/13 1732 06/24/13 1738   06/24/13 2200  efavirenz-emtricitabine-tenofovir (ATRIPLA) 600-200-300 MG per tablet 1 tablet     1 tablet Oral Daily at bedtime 06/24/13 1630     06/24/13 2200  efavirenz-emtricitabine-tenofovir (ATRIPLA) 600-200-300 MG per tablet 1 tablet  Status:  Discontinued     1 tablet Oral Daily at bedtime 06/24/13 1732 06/24/13 1738       Assessment/Plan 1. Posterior, medial left thigh wound POD #2 s/p bedside debridement  2. HIV  3. Lung Cancer  4. Seizure disorder   Plan:  1. Patient feeling better after debridement, tolerating dressing changes well  2. TID WD dressing changes and apply santyl daily  3. Continue Hydrotherapy, and antibiotics (Zosyn and Bactrim Day 4)  4. Afebrile and no WBC but immunocompromised  5. Hopefully home soon with [facility] care? and will need management of wound at the wound center. Upon discharge will need BID WD dressing changes  6. Gram stain shows gram negative rods, NGTD on culture, surgical path pending secondary to concern for metastasis 7. Given additional necrosis and induration has not improved, consider further  debridement.  During bedside debridement he jumped numerous times while local was being administered despite reminding him that he needed to stay still.  Thus, I would not re-attempt at the bedside for risk of needle stick.  Will discuss with Dr. Ninfa Linden about taking him to OR for further debridement.    LOS: 4 days    Coralie Keens 06/28/2013, 8:24 AM Pager: [phone]

## 2013-06-28 NOTE — Progress Notes (Signed)
ANTIBIOTIC CONSULT NOTE - Follow up  Pharmacy Consult for Zosyn Indication: Wound Infection-  Lower extremity  No Known Allergies  Patient Measurements: Height: 5\' 9"  (175.3 cm) Weight: 134 lb 4.8 oz (60.918 kg) IBW/kg (Calculated) : 70.7   Vital Signs: Temp: 98.5 F (36.9 C) (03/09 0459) Temp src: Oral (03/09 0459) BP: 105/71 mmHg (03/09 0459) Pulse Rate: 82 (03/09 0459) Intake/Output from previous day: 03/08 0701 - 03/09 0700 In: 460 [P.O.:360; IV Piggyback:100] Out: 1450 [Urine:1450] Intake/Output from this shift:    Labs:  Recent Labs  06/26/13 0845 06/27/13 0635  WBC 3.8* 3.4*  HGB 12.4* 10.8*  PLT 275 265  CREATININE 0.91  --    Estimated Creatinine Clearance: 75.3 ml/min (by C-G formula based on Cr of 0.91). No results found for this basename: VANCOTROUGH, Corlis Leak, VANCORANDOM, [place], GENTPEAK, GENTRANDOM, TOBRATROUGH, TOBRAPEAK, TOBRARND, AMIKACINPEAK, AMIKACINTROU, AMIKACIN,  in the last 72 hours   Microbiology: Recent Results (from the past 720 hour(s))  MRSA PCR SCREENING     Status: None   Collection Time    06/24/13  6:55 PM      Result Value Ref Range Status   MRSA by PCR NEGATIVE  NEGATIVE Final   Comment:            The GeneXpert MRSA Assay (FDA     approved for NASAL specimens     only), is one component of a     comprehensive MRSA colonization     surveillance program. It is not     intended to diagnose MRSA     infection nor to guide or     monitor treatment for     MRSA infections.  TISSUE CULTURE     Status: None   Collection Time    06/26/13  2:53 PM      Result Value Ref Range Status   Specimen Description TISSUE LEFT THIGH   Final   Special Requests NONE   Final   Gram Stain     Final   Value: NO WBC SEEN     NO SQUAMOUS EPITHELIAL CELLS SEEN     FEW GRAM NEGATIVE RODS     Performed at [facility]   Culture     Final   Value: Culture reincubated for better growth     Performed at [facility]   Report Status PENDING   Incomplete  ANAEROBIC CULTURE     Status: None   Collection Time    06/26/13  2:53 PM      Result Value Ref Range Status   Specimen Description TISSUE LEFT THIGH   Final   Special Requests NONE   Final   Gram Stain     Final   Value: NO WBC SEEN     NO SQUAMOUS EPITHELIAL CELLS SEEN     FEW GRAM NEGATIVE RODS     Performed at [facility]   Culture     Final   Value: NO ANAEROBES ISOLATED; CULTURE IN PROGRESS FOR 5 DAYS     Performed at [facility]   Report Status PENDING   Incomplete    Medical History: Past Medical History  Diagnosis Date  . 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 HIV infection   . SOB (shortness of breath)   . Chronic pain in right shoulder     scapula and arm for 2  months   . Anxiety     Medications:  Prescriptions prior to admission  Medication Sig Dispense Refill  . carbamazepine (TEGRETOL) 200 MG tablet take 3 tablets by mouth every morning 2 AT NOON and 3 tablets by mouth at bedtime  720 tablet  2  . dronabinol (MARINOL) 10 MG capsule Take 1 capsule (10 mg total) by mouth 2 (two) times daily before a meal.  90 capsule  3  . efavirenz-emtricitabine-tenofovir (ATRIPLA) 600-200-300 MG per tablet Take 1 tablet by mouth at bedtime.  90 tablet  3  . gabapentin (NEURONTIN) 300 MG capsule Take 1 capsule (300 mg total) by mouth 3 (three) times daily.  90 capsule  5  . PHENobarbital (LUMINAL) 64.8 MG tablet Take 1 tablet (64.8 mg total) by mouth at bedtime.  90 tablet  2  . sulfamethoxazole-trimethoprim (BACTRIM DS) 800-160 MG per tablet Take 1 tablet by mouth 3 (three) times a week. Mondays, Wednesdays, Fridays.  12 tablet  11   Scheduled:  . carbamazepine  600 mg Oral BID  . collagenase   Topical Daily  . dronabinol  10 mg Oral BID AC  . efavirenz-emtricitabine-tenofovir  1 tablet Oral QHS  . feeding supplement (RESOURCE BREEZE)  1 Container Oral  BID BM  . gabapentin  300 mg Oral TID  . heparin  5,000 Units Subcutaneous 3 times per day  . multivitamin with minerals  1 tablet Oral Daily  . PHENobarbital  64.8 mg Oral QHS  . piperacillin-tazobactam (ZOSYN)  IV  3.375 g Intravenous 3 times per day  . sulfamethoxazole-trimethoprim  1 tablet Oral Once per day on Mon Wed Fri   Assessment: 58 y.o male with stage IIB/IIIA non-small cell lung cancer, squamous cell Pancoast tumor admitted for left lower limb ulcer/mass, unclear etiology. Continuing on empiric Zosyn 3.375 gm IV q8h for wound infection. SCr 0.91, estimated CrCL is 75 ml/min. Afebrile, Tm 99.8, WBC 3.4K.  Surgeon notes 06/28/13 to continue local wound care and does not think more debridement needed currently. ID notes to continue current antibiotic therapy pending final cultures.  06/25/13  CT Left Femur: Large subcutaneous soft tissue defect noted posteriorly to the level of the fascia. No drainable fluid collection.  Tissue Gram stain reveals gram-negative rods and cultures are pending. ID notes to continue current antibiotic therapy pending final cultures.    Plan:  Continue Zosyn 3.375 gm IV q8hr (extended 4 hour infusion). Monitor clinical status, renal function, adjust dose if necessary.  Nicole Cella, RPh Clinical Pharmacist Pager: [phone] 06/28/2013,12:23 PM

## 2013-06-28 NOTE — Progress Notes (Signed)
Patient ID: Roger Walton, male   DOB: 05-14-1955, 58 y.o.   MRN: [id number]         [facility]    Date of Admission:  06/24/2013           Day 3 piperacillin tazobactam  Principal Problem:   Ulcer of lower limb Active Problems:   HIV DISEASE   DERMATOPHYTOSIS, SITE NOS   SEIZURE DISORDER   HX, PERSONAL, TUBERCULOSIS   Lung cancer, upper lobe   Chemotherapy induced neutropenia   Gangrene   . carbamazepine  600 mg Oral BID  . collagenase   Topical Daily  . dronabinol  10 mg Oral BID AC  . efavirenz-emtricitabine-tenofovir  1 tablet Oral QHS  . feeding supplement (RESOURCE BREEZE)  1 Container Oral BID BM  . gabapentin  300 mg Oral TID  . heparin  5,000 Units Subcutaneous 3 times per day  . multivitamin with minerals  1 tablet Oral Daily  . PHENobarbital  64.8 mg Oral QHS  . piperacillin-tazobactam (ZOSYN)  IV  3.375 g Intravenous 3 times per day  . sulfamethoxazole-trimethoprim  1 tablet Oral Once per day on Mon Wed Fri    Subjective: He still has some leg discomfort  Objective: Temp:  [98.5 F (36.9 C)-99 F (37.2 C)] 98.6 F (37 C) (03/09 1335) Pulse Rate:  [69-93] 93 (03/09 1335) Resp:  [18] 18 (03/09 1335) BP: (105-110)/(60-71) 106/70 mmHg (03/09 1335) SpO2:  [94 %-98 %] 94 % (03/09 1335)  General: He is in no distress  His left thigh wound is much cleaner   Lab Results Lab Results  Component Value Date   WBC 3.4* 06/27/2013   HGB 10.8* 06/27/2013   HCT 31.0* 06/27/2013   MCV 100.0 06/27/2013   PLT 265 06/27/2013    Lab Results  Component Value Date   CREATININE 0.91 06/26/2013   BUN 12 06/26/2013   NA 137 06/26/2013   K 4.5 06/26/2013   CL 101 06/26/2013   CO2 25 06/26/2013    Lab Results  Component Value Date   ALT 15 06/26/2013   AST 18 06/26/2013   ALKPHOS 87 06/26/2013   BILITOT <0.2* 06/26/2013      HIV 1 RNA Quant (copies/mL)  Date Value  05/05/2013 82*  12/09/2012 <20   06/29/2012 54*     CD4 T Cell Abs (/uL)  Date Value   05/05/2013 100*  12/09/2012 120*  06/29/2012 160*   Microbiology: Recent Results (from the past 240 hour(s))  MRSA PCR SCREENING     Status: None   Collection Time    06/24/13  6:55 PM      Result Value Ref Range Status   MRSA by PCR NEGATIVE  NEGATIVE Final   Comment:            The GeneXpert MRSA Assay (FDA     approved for NASAL specimens     only), is one component of a     comprehensive MRSA colonization     surveillance program. It is not     intended to diagnose MRSA     infection nor to guide or     monitor treatment for     MRSA infections.  TISSUE CULTURE     Status: None   Collection Time    06/26/13  2:53 PM      Result Value Ref Range Status   Specimen Description TISSUE LEFT THIGH   Final   Special Requests NONE  Final   Gram Stain     Final   Value: NO WBC SEEN     NO SQUAMOUS EPITHELIAL CELLS SEEN     FEW GRAM NEGATIVE RODS     Performed at [facility]   Culture     Final   Value: Culture reincubated for better growth     Performed at [facility]   Report Status PENDING   Incomplete  ANAEROBIC CULTURE     Status: None   Collection Time    06/26/13  2:53 PM      Result Value Ref Range Status   Specimen Description TISSUE LEFT THIGH   Final   Special Requests NONE   Final   Gram Stain     Final   Value: NO WBC SEEN     NO SQUAMOUS EPITHELIAL CELLS SEEN     FEW GRAM NEGATIVE RODS     Performed at [facility]   Culture     Final   Value: NO ANAEROBES ISOLATED; CULTURE IN PROGRESS FOR 5 DAYS     Performed at [facility]   Report Status PENDING   Incomplete    Assessment: Preliminary culture results suggest a mixture of bacteria with predominance of coagulase-negative staph. So far no apparent gram-negative rods or anaerobes are growing but I would assume that this is probably a mixed aerobic and anaerobic infection. He has a history of MRSA I would want to cover for that possibility as well. His wound is looking much  better after debridement. I will switch to oral doxycycline and amoxicillin clavulanate. From our standpoint, it is okay for him to be discharged home with outpatient followup in our clinic.  Plan: 1. Change piperacillin tazobactam to oral doxycycline and amoxicillin clavulanate and plan on 14 days of total therapy (11 more days) 2. Continue antiretroviral therapy and pneumocystis prophylaxis  Michel Bickers, MD [facility] [place] [phone] pager   [phone] cell 06/28/2013, 1:51 PM

## 2013-06-28 NOTE — Progress Notes (Signed)
Okay to send home since no further debridement is necessary right now.  Continue with saline soaked gauze packing with dry gauze on top BID.  Continue with santyl cream daily.  He should follow up with the wound care center as an outpatient vs having [facility] at first.  Continue antibiotics per ID.

## 2013-06-28 NOTE — Discharge Instructions (Signed)
[facility]  [phone].

## 2013-06-28 NOTE — Progress Notes (Signed)
I have seen and examined the patient and agree with the assessment and plans. Will continue local wound care.  I don't think it needs more debridement right now.  Will continue local wound care.  Suspect it will be indurated for a while.  Stori Royse A. Ninfa Linden  MD, FACS

## 2013-06-28 NOTE — Discharge Summary (Signed)
Physician Discharge Summary  Roger Walton OHY:[id number] DOB: 1955-04-26 DOA: 06/24/2013  PCP: Bobby Rumpf, MD  Admit date: 06/24/2013 Discharge date: 06/28/2013  Time spent: 25 minutes  Recommendations for Outpatient Follow-up:  1. Complete antibiotics doxycycline/Augmentin 3/20 2. Wound care order for the patient 3. Wet to dry dressings to be performed 4. Followup with infectious disease, oncology as an outpatient 5. CBC plus differential, Chem-12, viral loads to be considered as an outpatient  Discharge Diagnoses:  Principal Problem:   Ulcer of lower limb Active Problems:   HIV DISEASE   DERMATOPHYTOSIS, SITE NOS   SEIZURE DISORDER   HX, PERSONAL, TUBERCULOSIS   Lung cancer, upper lobe   Chemotherapy induced neutropenia   Gangrene   Discharge Condition: fair  Diet recommendation: regular  Filed Weights   06/24/13 1615  Weight: 60.918 kg (134 lb 4.8 oz)    History of present illness:  58 y.o ? 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 Mohammed/Dr. Hendrickson/Dr. Tammi Klippel and was on systemic chemotherapy c 5/5 fractions SBRT, 3 fractions SBRT to R Lower lung lesion  He also has a known history of childhood epilepsy from age of about 58 years old and takes his medications  Admitted for lower limb ulcer-which he finally clarified as being two-month duration and was noted to have purulent discharge and foul order emanating from this  ID was consulted  Gen surg was consulted and debrided wound bedside 3/6.   Cultures grew predominant staph, coagulase negative-next aerobic anaerobic infection suspected  Review of the wound by general surgery as well as ID denoted that this would probably be the best to look for right now and it was thought prudent to avoid further debridement as patient  has poor wound healing, immunosuppression at baseline and this may not heal up all the way regardless. Hydrotherapy was prescribed during hospital stay. Home health RN was ordered for the patient as was wound care center followup by case manager He will have dressings 3 times a day wet-to-dry + santyl daily  Consultants:  Infectious disease  General surgery Procedures:  None so far other than CT scans Antibiotics:  Zosyn 3/6 Augmentin 3/9-->3/20 Doxycycline 3/9/--->3/20  Discharge Exam: Filed Vitals:   06/28/13 1335  BP: 106/70  Pulse: 93  Temp: 98.6 F (37 C)  Resp: 18  alert pleasant and oriented Not moving around much Eating well General: EOMI NCAT Cardiovascular: S1-S2 no murmur or gallop Respiratory: clinically clear  Discharge Instructions  Discharge Orders   Future Appointments Provider Department Dept Phone   07/02/2013 8:30 AM [facility] [phone]   07/05/2013 10:30 AM Curt Bears, MD [facility] [phone]   07/05/2013 11:15 AM Lora Paula, MD [facility] [phone]   07/07/2013 10:30 AM Campbell Riches, MD [facility] [phone]   Future Orders Complete By Expires   Diet - low sodium heart healthy  As directed    Discharge instructions  As directed    Comments:     Complete both antibiotics doxycycline/Augmentin 3/20 I will cc this note to Dr. [name] who should be aware that you will be seeing him in about a week or week and a half If you have any fever  chills or any further massive drainage from that area, go to the emergency room We will set you up with wound care followup visit We will make sure nurse comes out to her house to help you do the dressings on the wound Good Luck   Increase activity slowly  As directed        Medication List         amoxicillin-clavulanate 875-125 MG  per tablet  Commonly known as:  AUGMENTIN  Take 1 tablet by mouth every 12 (twelve) hours.     carbamazepine 200 MG tablet  Commonly known as:  TEGRETOL  take 3 tablets by mouth every morning 2 AT NOON and 3 tablets by mouth at bedtime     collagenase ointment  Commonly known as:  SANTYL  Apply topically daily.     doxycycline 100 MG tablet  Commonly known as:  VIBRA-TABS  Take 1 tablet (100 mg total) by mouth every 12 (twelve) hours.     dronabinol 10 MG capsule  Commonly known as:  MARINOL  Take 1 capsule (10 mg total) by mouth 2 (two) times daily before a meal.     efavirenz-emtricitabine-tenofovir 600-200-300 MG per tablet  Commonly known as:  ATRIPLA  Take 1 tablet by mouth at bedtime.     gabapentin 300 MG capsule  Commonly known as:  NEURONTIN  Take 1 capsule (300 mg total) by mouth 3 (three) times daily.     PHENobarbital 64.8 MG tablet  Commonly known as:  LUMINAL  Take 1 tablet (64.8 mg total) by mouth at bedtime.     sulfamethoxazole-trimethoprim 800-160 MG per tablet  Commonly known as:  BACTRIM DS  Take 1 tablet by mouth 3 (three) times a week. Mondays, Wednesdays, Fridays.       No Known Allergies    The results of significant diagnostics from this hospitalization (including imaging, microbiology, ancillary and laboratory) are listed below for reference.    Significant Diagnostic Studies: Ct Abdomen Pelvis W Contrast  06/25/2013   CLINICAL DATA:  History of non-small-cell carcinoma  EXAM: CT ABDOMEN AND PELVIS WITH CONTRAST  TECHNIQUE: Multidetector CT imaging of the abdomen and pelvis was performed using the standard protocol following bolus administration of intravenous contrast.  CONTRAST:  15mL OMNIPAQUE IOHEXOL 300 MG/ML  SOLN  COMPARISON:  04/05/2013  FINDINGS: Lung bases are well aerated without focal infiltrate or sizable effusion. The previously seen parenchymal lesions are not well evaluated on this exam.  The liver is homogeneous in attenuation  without evidence of focal mass. The spleen again demonstrates a rounded hypodense lesion measuring 3.2 cm in greatest dimension. This is slightly larger than that seen on the prior exam a which time it measured 2.6 cm however this area showed no significant increased uptake on previous PET-CT and has been stable over multiple previous exams. Continued followup is recommended.  A hypodense lesion within the right adrenal gland is stable. The left adrenal gland is stable in appearance. The pancreas is unremarkable. The kidneys are well visualized and within normal limits. No renal calculi or urinary tract obstructive changes are seen. Small renal hypodensities are again seen and stable likely representing cysts. Normal excretion of contrast is noted on delayed imaging. The gallbladder is decompressed. No pelvic mass lesion is seen. A rounded hypodense lesion is noted in the posterior left buttock best seen on image number 68. This measures 2.9 cm and is stable from the prior exam. It may represent a sebaceous cyst. Some mild  edema is noted in the upper thigh which may be related to the known thigh lesion. Some inguinal lymph nodes are noted on the left. The largest of these measures 14 mm in dimension. These are new from the prior exam. They may be reactive in nature given the ulcerative lesion left thigh. No acute bony abnormality is noted. Soft plaque is noted within the distal aorta which is stable in appearance.  IMPRESSION: Stable changes in the right adrenal gland.  Slight increase in size of a hypodense lesion within the spleen. This previously showed no significant activity on PET-CT seen.  Stable appearing subcutaneous lesion in the posterior left buttock. This may represent a sebaceous cyst.  Lymphadenopathy within the left inguinal region. This may be reactive in nature but may be related to the known ulcerative lesion within the thigh. .   Electronically Signed   By: Inez Catalina M.D.   On: 06/25/2013  16:43   Ct Femur Left W Contrast  06/25/2013   CLINICAL DATA:  Question malignancy. Non-small-cell lung cancer. Ulcerative lesion on back of left thigh.  EXAM: CT OF THE LEFT FEMUR WITH CONTRAST  TECHNIQUE: Multidetector CT imaging of the left femur was performed according to the standard protocol following intravenous contrast administration. Multiplanar CT image reconstructions were also generated.  CONTRAST:  58mL OMNIPAQUE IOHEXOL 300 MG/ML  SOLN  COMPARISON:  CT abdomen and pelvis performed today.  FINDINGS: No focal bony abnormality. No bone lesion. No fracture, subluxation or dislocation.  There is left inguinal adenopathy. Largest left inguinal lymph node has a short axis diameter of 1.4 cm. Central low-density suggest possibility of necrosis. Other borderline and mildly enlarged left inguinal lymph nodes.  Moderate-sized left hydrocele noted.  There is a large soft tissue defect noted in the posterior soft tissues of the upper left thigh. Gas extends in the subcutaneous soft tissues down to the fascia layer. I see no intramuscular extension. No focal fluid collection.  IMPRESSION: Large subcutaneous soft tissue defect noted posteriorly to the level of the fascia. No drainable fluid collection.  No acute bony abnormality or focal bone lesion.  Left inguinal adenopathy.   Electronically Signed   By: Rolm Baptise M.D.   On: 06/25/2013 16:55    Microbiology: Recent Results (from the past 240 hour(s))  MRSA PCR SCREENING     Status: None   Collection Time    06/24/13  6:55 PM      Result Value Ref Range Status   MRSA by PCR NEGATIVE  NEGATIVE Final   Comment:            The GeneXpert MRSA Assay (FDA     approved for NASAL specimens     only), is one component of a     comprehensive MRSA colonization     surveillance program. It is not     intended to diagnose MRSA     infection nor to guide or     monitor treatment for     MRSA infections.  TISSUE CULTURE     Status: None   Collection Time     06/26/13  2:53 PM      Result Value Ref Range Status   Specimen Description TISSUE LEFT THIGH   Final   Special Requests NONE   Final   Gram Stain     Final   Value: NO WBC SEEN     NO SQUAMOUS EPITHELIAL CELLS SEEN     FEW GRAM NEGATIVE RODS  Performed at [facility]     Final   Value: Culture reincubated for better growth     Performed at [facility]   Report Status PENDING   Incomplete  ANAEROBIC CULTURE     Status: None   Collection Time    06/26/13  2:53 PM      Result Value Ref Range Status   Specimen Description TISSUE LEFT THIGH   Final   Special Requests NONE   Final   Gram Stain     Final   Value: NO WBC SEEN     NO SQUAMOUS EPITHELIAL CELLS SEEN     FEW GRAM NEGATIVE RODS     Performed at [facility]   Culture     Final   Value: NO ANAEROBES ISOLATED; CULTURE IN PROGRESS FOR 5 DAYS     Performed at [facility]   Report Status PENDING   Incomplete     Labs: Basic Metabolic Panel:  Recent Labs Lab 06/24/13 1957 06/25/13 0547 06/26/13 0845  NA 137 139 137  K 4.2 4.4 4.5  CL 101 103 101  CO2 24 23 25   GLUCOSE 91 95 94  BUN 18 18 12   CREATININE 0.81 0.87 0.91  CALCIUM 8.4 8.5 9.2   Liver Function Tests:  Recent Labs Lab 06/24/13 1957 06/26/13 0845  AST 27 18  ALT 19 15  ALKPHOS 88 87  BILITOT 0.2* <0.2*  PROT 6.6 7.4  ALBUMIN 2.6* 2.6*   No results found for this basename: LIPASE, AMYLASE,  in the last 168 hours No results found for this basename: AMMONIA,  in the last 168 hours CBC:  Recent Labs Lab 06/24/13 1957 06/25/13 0547 06/26/13 0845 06/27/13 0635  WBC 5.4 3.7* 3.8* 3.4*  NEUTROABS 4.0  --  2.3 1.6*  HGB 10.9* 10.1* 12.4* 10.8*  HCT 31.2* 28.8* 35.6* 31.0*  MCV 98.7 100.3* 99.7 100.0  PLT 244 229 275 265   Cardiac Enzymes: No results found for this basename: CKTOTAL, CKMB, CKMBINDEX, TROPONINI,  in the last 168 hours BNP: BNP (last 3 results) No results found for this  basename: PROBNP,  in the last 8760 hours CBG:  Recent Labs Lab 06/25/13 0757 06/26/13 0810 06/27/13 0646  GLUCAP 100* 95 99       Signed:  Nita Sells  [facility] 06/28/2013, 2:33 PM

## 2013-06-28 NOTE — Progress Notes (Signed)
NURSING PROGRESS NOTE  Roger Walton [id number] Discharge Data: 06/28/2013 4:16 PM Attending Provider: Nita Sells, MD GLO:VFIEPPI Roger Sima, MD     Roger Walton to be D/C'd Home per MD order.  Discussed with the patient the After Visit Summary and all questions fully answered. All IV's discontinued with no bleeding noted. All belongings returned to patient for patient to take home.   Last Vital Signs:  Blood pressure 106/70, pulse 93, temperature 98.6 F (37 C), temperature source Oral, resp. rate 18, height 5\' 9"  (1.753 m), weight 60.918 kg (134 lb 4.8 oz), SpO2 94.00%.  Discharge Medication List   Medication List         amoxicillin-clavulanate 875-125 MG per tablet  Commonly known as:  AUGMENTIN  Take 1 tablet by mouth every 12 (twelve) hours.     carbamazepine 200 MG tablet  Commonly known as:  TEGRETOL  take 3 tablets by mouth every morning 2 AT NOON and 3 tablets by mouth at bedtime     collagenase ointment  Commonly known as:  SANTYL  Apply topically daily.     doxycycline 100 MG tablet  Commonly known as:  VIBRA-TABS  Take 1 tablet (100 mg total) by mouth every 12 (twelve) hours.     dronabinol 10 MG capsule  Commonly known as:  MARINOL  Take 1 capsule (10 mg total) by mouth 2 (two) times daily before a meal.     efavirenz-emtricitabine-tenofovir 600-200-300 MG per tablet  Commonly known as:  ATRIPLA  Take 1 tablet by mouth at bedtime.     gabapentin 300 MG capsule  Commonly known as:  NEURONTIN  Take 1 capsule (300 mg total) by mouth 3 (three) times daily.     oxyCODONE 5 MG immediate release tablet  Commonly known as:  Oxy IR/ROXICODONE  Take 1 tablet (5 mg total) by mouth every 4 (four) hours as needed for severe pain.     PHENobarbital 64.8 MG tablet  Commonly known as:  LUMINAL  Take 1 tablet (64.8 mg total) by mouth at bedtime.     sulfamethoxazole-trimethoprim 800-160 MG per tablet  Commonly known as:  BACTRIM DS  Take 1 tablet by mouth 3  (three) times a week. Mondays, Wednesdays, Fridays.

## 2013-06-28 NOTE — Progress Notes (Signed)
Physical Therapy Wound Treatment Patient Details  Name: TYJUAN DEMETRO MRN: [id number] Date of Birth: 04-16-1956  Today's Date: 06/28/2013 Time: 1004-1038 Time Calculation (min): 34 min  Subjective  Subjective: Reports wound is less sore than yesterday Patient and Family Stated Goals: Get wound better Date of Onset:  (several months ago)  Pain Score: 5/10 lt thigh; requested pain medicine prior to procedure  Wound Assessment  Wound / Incision (Open or Dehisced) 06/24/13 Other (Comment) Hip Left;Medial;Mid (Active)  Dressing Type Moist to dry;Barrier Film (skin prep);Gauze (Comment);Other (Comment) Santyl 06/28/2013 10:41 AM  Dressing Changed New 06/28/2013 10:41 AM  Dressing Status Clean;Dry;Intact 06/28/2013 10:41 AM  Dressing Change Frequency Other (Comment) 06/28/2013 10:41 AM  Site / Wound Assessment Black;Granulation tissue;Yellow 06/28/2013 10:41 AM  % Wound base Red or Granulating 65% 06/28/2013 10:41 AM  % Wound base Yellow 25% 06/28/2013 10:41 AM  % Wound base Black 10% 06/28/2013 10:41 AM  % Wound base Other (Comment) 0% 06/28/2013 10:41 AM  Peri-wound Assessment Induration;Edema 06/28/2013 10:41 AM  Wound Length (cm) 3.1 cm 06/28/2013 10:41 AM  Wound Width (cm) 3.3 cm 06/28/2013 10:41 AM  Wound Depth (cm) 1.6 cm 06/28/2013 10:41 AM  Margins Unattached edges (unapproximated) 06/28/2013 10:41 AM  Drainage Amount Minimal 06/28/2013 10:41 AM  Drainage Description Serosanguineous;No odor 06/28/2013 10:41 AM  Treatment Debridement (Selective);Hydrotherapy (Pulse lavage);Packing (Saline gauze);Tape changed 06/28/2013 10:41 AM   Hydrotherapy Pulsed lavage therapy - wound location: lt posterior thigh Pulsed Lavage with Suction (psi): 8 psi Pulsed Lavage with Suction - Normal Saline Used: 1000 mL Pulsed Lavage Tip: Tip with splash shield Selective Debridement Selective Debridement - Location: lt posterior thigh Selective Debridement - Tools Used: Forceps;Scissors Selective Debridement - Tissue Removed: yellow  and black eschar   Wound Assessment and Plan  Wound Therapy - Assess/Plan/Recommendations Wound Therapy - Clinical Statement: Pt presents to hydrotherapy with necrotic wound that can benefit from hydrotherapy to decrease eschar and promote wound healing. Wound Therapy - Functional Problem List: Decr activity due to lower extremity wound. Factors Delaying/Impairing Wound Healing: Multiple medical problems;Polypharmacy Hydrotherapy Plan: Debridement;Dressing change;Patient/family education;Pulsatile lavage with suction Wound Therapy - Frequency: 6X / week Wound Therapy - Current Recommendations: Surgery consult Wound Therapy - Follow Up Recommendations: Home health RN Wound Plan: See above  Wound Therapy Goals- Improve the function of patient's integumentary system by progressing the wound(s) through the phases of wound healing (inflammation - proliferation - remodeling) by: Decrease Necrotic Tissue to: 90% Decrease Necrotic Tissue - Progress: Progressing toward goal Increase Granulation Tissue to: 10% Increase Granulation Tissue - Progress: Progressing toward goal  Goals will be updated until maximal potential achieved or discharge criteria met.  Discharge criteria: when goals achieved, discharge from hospital, MD decision/surgical intervention, no progress towards goals, refusal/missing three consecutive treatments without notification or medical reason.  GP     Bryne Lindon 06/28/2013, 10:51 AM Pager [phone]

## 2013-06-28 NOTE — Care Management Note (Signed)
    Page 1 of 1   06/28/2013     2:47:01 PM   CARE MANAGEMENT NOTE 06/28/2013  Patient:  Roger Walton, Roger Walton   Account Number:  [id number]  Date Initiated:  06/28/2013  Documentation initiated by:  Tomi Bamberger  Subjective/Objective Assessment:   dx rt leg ulcer and o42  admit- lives with spouse. pta indep.     Action/Plan:   Anticipated DC Date:  06/28/2013   Anticipated DC Plan:  [place]  CM consult      [facility] Choice  HOME HEALTH   Choice offered to / List presented to:  C-1 Patient        [place] arranged  HH-1 RN      [place].   Status of service:  Completed, signed off Medicare Important Message given?   (If response is "NO", the following Medicare IM given date fields will be blank) Date Medicare IM given:   Date Additional Medicare IM given:    Discharge Disposition:  [place]  Per UR Regulation:  Reviewed for med. necessity/level of care/duration of stay  If discussed at [place] of Stay Meetings, dates discussed:    Comments:  06/28/13 [place], BSN [phone] patient is for dc today, patient has follow up apt at wound care clinic at [facility], patient chose [facility] for [facility] for wound care as well.  Referral made to [facility] , [place] notified.  Soc will begin 24-48 hrs.

## 2013-06-29 ENCOUNTER — Ambulatory Visit: Payer: Medicare Other | Admitting: Internal Medicine

## 2013-06-29 LAB — TISSUE CULTURE: GRAM STAIN: NONE SEEN

## 2013-06-30 ENCOUNTER — Telehealth: Payer: Self-pay | Admitting: *Deleted

## 2013-06-30 ENCOUNTER — Telehealth (INDEPENDENT_AMBULATORY_CARE_PROVIDER_SITE_OTHER): Payer: Self-pay

## 2013-06-30 NOTE — Telephone Encounter (Signed)
Spoke with Dr. Avis Epley in Pathology.  He wanted Dr. Lucia Gaskins to know that this patient's diagnosis is positive for carcinoma.  His report will be in Epic.

## 2013-06-30 NOTE — Telephone Encounter (Signed)
Left message for wife, Jeannene Patella, to call [facility] if the anticonvulsant medications are not in the home.  Prescription sent to [facility] 06/14/13 with refills.  RN offered to send the prescriptions to a local pharmacy if the patient needs them immediately.  Left [facility] phone number for call back.

## 2013-07-02 ENCOUNTER — Other Ambulatory Visit: Payer: Self-pay | Admitting: Infectious Diseases

## 2013-07-02 ENCOUNTER — Telehealth: Payer: Self-pay | Admitting: *Deleted

## 2013-07-02 ENCOUNTER — Ambulatory Visit (HOSPITAL_COMMUNITY): Admission: RE | Admit: 2013-07-02 | Payer: Medicare Other | Source: Ambulatory Visit

## 2013-07-02 ENCOUNTER — Other Ambulatory Visit: Payer: Medicare Other

## 2013-07-02 ENCOUNTER — Other Ambulatory Visit: Payer: Self-pay | Admitting: *Deleted

## 2013-07-02 DIAGNOSIS — R569 Unspecified convulsions: Secondary | ICD-10-CM

## 2013-07-02 LAB — ANAEROBIC CULTURE: Gram Stain: NONE SEEN

## 2013-07-02 NOTE — Telephone Encounter (Signed)
Call from [place] with [facility]. While at the home patient stated he did not have the augmentin and doxycycline that was sent in by the hospitalist. [facility] pharmacy and pharmacist confirmed that these medications were picked up on 06/28/13. Name and birth date is always confirmed at pick up. It is unclear why patient is not taking the antibiotics. Home Health is aware that antibiotics were picked up. Myrtis Hopping

## 2013-07-05 ENCOUNTER — Ambulatory Visit: Admission: RE | Admit: 2013-07-05 | Payer: Medicare Other | Source: Ambulatory Visit | Admitting: Radiation Oncology

## 2013-07-05 ENCOUNTER — Ambulatory Visit: Payer: Medicare Other | Admitting: Internal Medicine

## 2013-07-05 ENCOUNTER — Telehealth: Payer: Self-pay | Admitting: *Deleted

## 2013-07-05 ENCOUNTER — Telehealth: Payer: Self-pay | Admitting: Internal Medicine

## 2013-07-05 ENCOUNTER — Ambulatory Visit (HOSPITAL_COMMUNITY): Payer: Medicare Other

## 2013-07-05 ENCOUNTER — Other Ambulatory Visit: Payer: Medicare Other

## 2013-07-05 NOTE — Telephone Encounter (Signed)
Message copied by Landis Gandy on Mon Jul 05, 2013 10:39 AM ------      Message from: HATCHER, JEFFREY C      Created: Mon Jul 05, 2013  9:51 AM       Does she have f/u with surgery?       Doxy should cover prevotella. ------

## 2013-07-05 NOTE — Telephone Encounter (Signed)
Received call from [place] stating that pt missed his CT appt today.  He is scheduled for f/u with [facility] 3/19.  Attempted to call home #, left msg with instructions regarding calling central scheduling to get new CT appt and that we cannot keep 3/19 appt if he does not get CT scan done.  SLJ

## 2013-07-05 NOTE — Progress Notes (Signed)
Pt scheduled at [facility] 3/20 at 10:00.

## 2013-07-05 NOTE — Telephone Encounter (Signed)
Confirmed patient's upcoming appointments (wife thought they were coming here this afternoon, clarified that she is to go to Dr. Tammi Klippel in 45 minutes, follow up here 3/18). Patient will f/u with Dr. Lucia Gaskins [facility] 3/20, 10:00.   Patient does have doxycycline and is taking it per wife. Landis Gandy, RN

## 2013-07-05 NOTE — Telephone Encounter (Signed)
s/w pt wife this morning re lab for today @ 12:15 pm after seeing Dr. Tammi Klippel and prior to 1:30pm ct . pt also has f/u 3/19 and will get new schedule when he comes in.

## 2013-07-07 ENCOUNTER — Ambulatory Visit: Payer: Medicare Other | Admitting: Infectious Diseases

## 2013-07-07 ENCOUNTER — Telehealth: Payer: Self-pay | Admitting: *Deleted

## 2013-07-07 NOTE — Telephone Encounter (Signed)
Requests to speak with Dr. Worthy Flank nurse about "something that happened last night". Would not communicate issue to this nurse. Forwarded message.

## 2013-07-07 NOTE — Telephone Encounter (Signed)
Requested new appt.  Transportation issues this AM.  New appt made.

## 2013-07-08 ENCOUNTER — Encounter (HOSPITAL_BASED_OUTPATIENT_CLINIC_OR_DEPARTMENT_OTHER): Payer: Medicare Other | Attending: Internal Medicine

## 2013-07-08 ENCOUNTER — Ambulatory Visit: Payer: Medicare Other | Admitting: Internal Medicine

## 2013-07-08 ENCOUNTER — Telehealth: Payer: Self-pay | Admitting: Medical Oncology

## 2013-07-08 ENCOUNTER — Other Ambulatory Visit: Payer: Self-pay | Admitting: Medical Oncology

## 2013-07-08 ENCOUNTER — Other Ambulatory Visit: Payer: Medicare Other

## 2013-07-08 DIAGNOSIS — C3410 Malignant neoplasm of upper lobe, unspecified bronchus or lung: Secondary | ICD-10-CM

## 2013-07-08 NOTE — Telephone Encounter (Signed)
I confirmed pt appointments with Ms Goya . She will call back if pt cannot make appt.

## 2013-07-08 NOTE — Progress Notes (Signed)
Wife called back and said Cobin cannot come today . She said he will call back to reschedule.

## 2013-07-09 ENCOUNTER — Emergency Department (HOSPITAL_COMMUNITY): Payer: Medicare Other

## 2013-07-09 ENCOUNTER — Encounter (HOSPITAL_COMMUNITY): Payer: Self-pay | Admitting: Emergency Medicine

## 2013-07-09 ENCOUNTER — Ambulatory Visit (INDEPENDENT_AMBULATORY_CARE_PROVIDER_SITE_OTHER): Payer: Medicare Other | Admitting: Surgery

## 2013-07-09 ENCOUNTER — Emergency Department (HOSPITAL_COMMUNITY)
Admission: EM | Admit: 2013-07-09 | Discharge: 2013-07-09 | Disposition: A | Discharge status: Home / Self Care | Payer: Medicare Other | Attending: Emergency Medicine | Admitting: Emergency Medicine

## 2013-07-09 DIAGNOSIS — Z21 Asymptomatic human immunodeficiency virus [HIV] infection status: Secondary | ICD-10-CM | POA: Insufficient documentation

## 2013-07-09 DIAGNOSIS — G40909 Epilepsy, unspecified, not intractable, without status epilepticus: Secondary | ICD-10-CM | POA: Insufficient documentation

## 2013-07-09 DIAGNOSIS — Z85118 Personal history of other malignant neoplasm of bronchus and lung: Secondary | ICD-10-CM | POA: Insufficient documentation

## 2013-07-09 DIAGNOSIS — Z87891 Personal history of nicotine dependence: Secondary | ICD-10-CM | POA: Insufficient documentation

## 2013-07-09 DIAGNOSIS — Z87828 Personal history of other (healed) physical injury and trauma: Secondary | ICD-10-CM | POA: Insufficient documentation

## 2013-07-09 DIAGNOSIS — R569 Unspecified convulsions: Secondary | ICD-10-CM

## 2013-07-09 DIAGNOSIS — M25519 Pain in unspecified shoulder: Secondary | ICD-10-CM | POA: Insufficient documentation

## 2013-07-09 DIAGNOSIS — Z79899 Other long term (current) drug therapy: Secondary | ICD-10-CM | POA: Insufficient documentation

## 2013-07-09 DIAGNOSIS — F411 Generalized anxiety disorder: Secondary | ICD-10-CM | POA: Insufficient documentation

## 2013-07-09 DIAGNOSIS — Z792 Long term (current) use of antibiotics: Secondary | ICD-10-CM | POA: Insufficient documentation

## 2013-07-09 DIAGNOSIS — R000 Tachycardia, unspecified: Secondary | ICD-10-CM | POA: Insufficient documentation

## 2013-07-09 DIAGNOSIS — G8929 Other chronic pain: Secondary | ICD-10-CM | POA: Insufficient documentation

## 2013-07-09 DIAGNOSIS — IMO0002 Reserved for concepts with insufficient information to code with codable children: Secondary | ICD-10-CM | POA: Insufficient documentation

## 2013-07-09 LAB — COMPREHENSIVE METABOLIC PANEL
ALT: 16 U/L (ref 0–53)
AST: 35 U/L (ref 0–37)
Albumin: 3.3 g/dL — ABNORMAL LOW (ref 3.5–5.2)
Alkaline Phosphatase: 90 U/L (ref 39–117)
BILIRUBIN TOTAL: 0.3 mg/dL (ref 0.3–1.2)
BUN: 21 mg/dL (ref 6–23)
CHLORIDE: 99 meq/L (ref 96–112)
CO2: 15 meq/L — AB (ref 19–32)
CREATININE: 1.14 mg/dL (ref 0.50–1.35)
Calcium: 10 mg/dL (ref 8.4–10.5)
GFR calc Af Amer: 80 mL/min — ABNORMAL LOW (ref >90)
GFR, EST NON AFRICAN AMERICAN: 69 mL/min — AB (ref >90)
Glucose, Bld: 159 mg/dL — ABNORMAL HIGH (ref 70–99)
Potassium: 4.8 mEq/L (ref 3.7–5.3)
Sodium: 144 mEq/L (ref 137–147)
Total Protein: 8 g/dL (ref 6.0–8.3)

## 2013-07-09 LAB — PHENOBARBITAL LEVEL: Phenobarbital: 4.8 ug/mL — ABNORMAL LOW (ref 15.0–40.0)

## 2013-07-09 LAB — I-STAT CG4 LACTIC ACID, ED: Lactic Acid, Venous: 2.1 mmol/L (ref 0.5–2.2)

## 2013-07-09 LAB — CBC WITH DIFFERENTIAL/PLATELET
BASOS PCT: 0 % (ref 0–1)
Basophils Absolute: 0 10*3/uL (ref 0.0–0.1)
EOS PCT: 0 % (ref 0–5)
Eosinophils Absolute: 0 10*3/uL (ref 0.0–0.7)
HEMATOCRIT: 34.1 % — AB (ref 39.0–52.0)
HEMOGLOBIN: 11.1 g/dL — AB (ref 13.0–17.0)
LYMPHS PCT: 26 % (ref 12–46)
Lymphs Abs: 2.6 10*3/uL (ref 0.7–4.0)
MCH: 33.9 pg (ref 26.0–34.0)
MCHC: 32.6 g/dL (ref 30.0–36.0)
MCV: 104.3 fL — ABNORMAL HIGH (ref 78.0–100.0)
MONO ABS: 0.8 10*3/uL (ref 0.1–1.0)
Monocytes Relative: 8 % (ref 3–12)
Neutro Abs: 6.5 10*3/uL (ref 1.7–7.7)
Neutrophils Relative %: 66 % (ref 43–77)
Platelets: 302 10*3/uL (ref 150–400)
RBC: 3.27 MIL/uL — ABNORMAL LOW (ref 4.22–5.81)
RDW: 14.7 % (ref 11.5–15.5)
WBC: 9.8 10*3/uL (ref 4.0–10.5)

## 2013-07-09 LAB — PHENYTOIN LEVEL, TOTAL

## 2013-07-09 LAB — CARBAMAZEPINE LEVEL, TOTAL: Carbamazepine Lvl: <0.5 ug/mL — ABNORMAL LOW (ref 4.0–12.0)

## 2013-07-09 MED ORDER — LORAZEPAM 2 MG/ML IJ SOLN
INTRAMUSCULAR | Status: DC
Start: 2013-07-09 — End: 2013-07-10
  Filled 2013-07-09: qty 1

## 2013-07-09 MED ORDER — CARBAMAZEPINE 200 MG PO TABS
600.0000 mg | ORAL_TABLET | Freq: Once | ORAL | Status: DC
  Filled 2013-07-09: qty 3

## 2013-07-09 MED ORDER — SODIUM CHLORIDE 0.9 % IV BOLUS (SEPSIS)
1000.0000 mL | Freq: Once | INTRAVENOUS | Status: AC
  Administered 2013-07-09: 1000 mL via INTRAVENOUS

## 2013-07-09 MED ORDER — LORAZEPAM 2 MG/ML IJ SOLN
1.0000 mg | Freq: Once | INTRAMUSCULAR | Status: AC
  Administered 2013-07-09: 1 mg via INTRAVENOUS
  Filled 2013-07-09: qty 1

## 2013-07-09 MED ORDER — LORAZEPAM 2 MG/ML IJ SOLN
2.0000 mg | Freq: Once | INTRAMUSCULAR | Status: AC
  Administered 2013-07-09: 2 mg via INTRAMUSCULAR

## 2013-07-09 MED ORDER — PHENOBARBITAL 32.4 MG PO TABS
64.8000 mg | ORAL_TABLET | Freq: Once | ORAL | Status: DC
  Filled 2013-07-09: qty 2

## 2013-07-09 NOTE — ED Notes (Signed)
Pt to CT

## 2013-07-09 NOTE — ED Notes (Signed)
md made aware pt resting at present, PA said when pt is fully awake to ambulate pt

## 2013-07-09 NOTE — ED Notes (Signed)
Pt refused oral medication.Pt agitated and aggressive. Pt told that if he calmed down and cooperated he would be released from restraints.  Pt attempting to bite and spit at nurse. Pt verbally abusive to Nurse. PA notified. PA asked to change medication to IV form.

## 2013-07-09 NOTE — ED Notes (Addendum)
Per ems pt is from jail, assault charge in past, staff advised this was 3rd seizure this morning, staff had given pt his prescribed dilantin. Seizure lasted about 20 seconds, abrasion on top of head, no bleeding. No oral trauma, no incontinence. Pt verbally aggressive. Not postical upon arrival to ED.  sheriff at bedside, hands and feet handcuffed.

## 2013-07-09 NOTE — ED Provider Notes (Signed)
CSN: [id number]     Arrival date & time 07/09/13  1502 History   First MD Initiated Contact with Patient 07/09/13 1507     Chief Complaint  Patient presents with  . Seizures     (Consider location/radiation/quality/duration/timing/severity/associated sxs/prior Treatment) HPI Comments: Patient with h/o seizure disorder since childhood, on phenobarbitol and tegretol (dilantin? Per report), AIDS, lung CA -- presents with seizures today x 3. He has been in prison for the past 2 days. Per EMS/prison report, previous seizures lasted for 20 seconds, occurred during approximately 4 hr period late morning/early afternoon. Patient has been verbally abusive and combative during transport. Sheriff witnessed seizure lasting approx 5 minutes about 10 minutes prior to exam. He describes eyes rolling back in head and to right, shaking and 'muscle spasms'. Level V caveat due to patient combative, possibly postictal.   Patient is a 58 y.o. male presenting with seizures. The history is provided by the police and medical records.  Seizures   Past Medical History  Diagnosis Date  . 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 HIV infection   . SOB (shortness of breath)   . Chronic pain in right shoulder     scapula and arm for 2 months   . Anxiety    Past Surgical History  Procedure Laterality Date  . Fine needle aspiration     Family History  Problem Relation Age of Onset  . Colon cancer Neg Hx    History  Substance Use Topics  . Smoking status: Former Smoker -- 1.00 packs/day for 40 years    Types: Cigarettes  . Smokeless tobacco: Never Used     Comment: 07/03/12 states pt  . Alcohol Use: No    Review of Systems  Unable to perform ROS: Other  Neurological: Positive for seizures.      Allergies  Review of patient's allergies indicates no known allergies.  Home Medications   Current Outpatient Rx  Name  Route  Sig   Dispense  Refill  . amoxicillin-clavulanate (AUGMENTIN) 875-125 MG per tablet   Oral   Take 1 tablet by mouth every 12 (twelve) hours.   22 tablet   0   . carbamazepine (TEGRETOL) 200 MG tablet      take 3 tablets by mouth every morning 2 AT NOON and 3 tablets by mouth at bedtime   720 tablet   2   . collagenase (SANTYL) ointment   Topical   Apply topically daily.   15 g   0   . doxycycline (VIBRA-TABS) 100 MG tablet   Oral   Take 1 tablet (100 mg total) by mouth every 12 (twelve) hours.   22 tablet   0   . dronabinol (MARINOL) 10 MG capsule   Oral   Take 1 capsule (10 mg total) by mouth 2 (two) times daily before a meal.   90 capsule   3   . efavirenz-emtricitabine-tenofovir (ATRIPLA) 600-200-300 MG per tablet   Oral   Take 1 tablet by mouth at bedtime.   90 tablet   3   . gabapentin (NEURONTIN) 300 MG capsule   Oral   Take 1 capsule (300 mg total) by mouth 3 (three) times daily.   90 capsule   5   . oxyCODONE (OXY IR/ROXICODONE) 5 MG immediate release tablet   Oral   Take 1 tablet (5 mg total) by mouth every 4 (  four) hours as needed for severe pain.   30 tablet   0   . PHENobarbital (LUMINAL) 64.8 MG tablet   Oral   Take 1 tablet (64.8 mg total) by mouth at bedtime.   90 tablet   2   . sulfamethoxazole-trimethoprim (BACTRIM DS) 800-160 MG per tablet   Oral   Take 1 tablet by mouth 3 (three) times a week. Mondays, Wednesdays, Fridays.   12 tablet   11     To begin after completion of prescription written  ...    BP 117/61  Pulse 93  Temp(Src) 99 F (37.2 C) (Oral)  Resp 21  SpO2 97% Physical Exam  Nursing note and vitals reviewed. Constitutional: He appears well-developed and well-nourished.  HENT:  Head: Normocephalic.  Right Ear: Tympanic membrane, external ear and ear canal normal.  Left Ear: Tympanic membrane, external ear and ear canal normal.  Nose: Nose normal.  Mouth/Throat: Uvula is midline, oropharynx is clear and moist and  mucous membranes are normal.  Small skin tear to the top of head. Hemostatic. One steri-strip in place.   Eyes: Conjunctivae, EOM and lids are normal. Pupils are equal, round, and reactive to light.  Neck: Normal range of motion. Neck supple.  Cardiovascular: Regular rhythm.  Tachycardia present.  Exam reveals no gallop and no friction rub.   No murmur heard. Pulmonary/Chest: Effort normal and breath sounds normal. No respiratory distress. He has no wheezes. He has no rales.  Abdominal: Soft. There is no tenderness. There is no rebound and no guarding.  Musculoskeletal: Normal range of motion. He exhibits no edema.       Cervical back: He exhibits normal range of motion, no tenderness and no bony tenderness.  Neurological: He is alert. He has normal strength. No sensory deficit. He displays a negative Romberg sign. Gait normal. GCS eye subscore is 4. GCS verbal subscore is 5. GCS motor subscore is 6.  Neuro exam limited by patient cooperation. Moves all 4 extremities purposefully. Normal muscle tone.   Skin: Skin is warm and dry.  Wound posterior L thigh, open, appears well healing.   Psychiatric: He is agitated.    ED Course  Procedures (including critical care time) Labs Review Labs Reviewed  CBC WITH DIFFERENTIAL - Abnormal; Notable for the following:    RBC 3.27 (*)    Hemoglobin 11.1 (*)    HCT 34.1 (*)    MCV 104.3 (*)    All other components within normal limits  COMPREHENSIVE METABOLIC PANEL - Abnormal; Notable for the following:    CO2 15 (*)    Glucose, Bld 159 (*)    Albumin 3.3 (*)    GFR calc non Af Amer 69 (*)    GFR calc Af Amer 80 (*)    All other components within normal limits  PHENYTOIN LEVEL, TOTAL - Abnormal; Notable for the following:    Phenytoin Lvl <2.5 (*)    All other components within normal limits  CARBAMAZEPINE LEVEL, TOTAL - Abnormal; Notable for the following:    Carbamazepine Lvl <0.5 (*)    All other components within normal limits   PHENOBARBITAL LEVEL - Abnormal; Notable for the following:    Phenobarbital 4.8 (*)    All other components within normal limits  I-STAT CG4 LACTIC ACID, ED   Imaging Review Ct Head Wo Contrast  07/09/2013   CLINICAL DATA:  Seizure.  History of lung cancer, AIDS.  EXAM: CT HEAD WITHOUT CONTRAST  TECHNIQUE: Contiguous axial images  were obtained from the base of the skull through the vertex without intravenous contrast.  COMPARISON:  None.  FINDINGS: No acute intracranial abnormality. Specifically, no hemorrhage, hydrocephalus, mass lesion, acute infarction, or significant intracranial injury. No acute calvarial abnormality. Visualized paranasal sinuses and mastoids clear. Orbital soft tissues unremarkable.  IMPRESSION: No acute intracranial abnormality.   Electronically Signed   By: Rolm Baptise M.D.   On: 07/09/2013 16:10     EKG Interpretation None      3:23 PM Patient seen. Work-up initiated. Medications ordered.   Vital signs reviewed and are as follows: Filed Vitals:   07/09/13 1630  BP: 104/58  Pulse: 116  Temp:   Resp: 20    Entered room to find patient handcuffed, uncooperative, trying to claw at staff with fingernails. He was restrained with soft 4 pt restraints due to danger to self/staff.   Ativan 2mg  IM ordered due to history of multiple seizures and due to patient being uncooperative.   Sheriff states patient had possible seizure 10 minutes prior.    3:39 PM Patient continues to flail in bed. IV was started by nurse with assistance of staff and sheriff. Additional 1mg  IV ativan ordered. Labs sent.   4:15 PM Patient now sleeping.   4:46 PM Pt d/w and seen by Dr. Eulis Foster.    Date: 07/09/2013  Rate: 117  Rhythm: sinus tachycardia  QRS Axis: normal  Intervals: normal  ST/T Wave abnormalities: nonspecific T wave changes  Conduction Disutrbances:none  Narrative Interpretation:   Old EKG Reviewed: changes noted, faster  5:50 PM AG elevated. D/w Dr. Eulis Foster. Will  give doses of home medications.   8:46 PM Patient is now awake. He is not combative but is defiant and will not take his oral medications. He appears to be aware of what is occuring but is minimally conversant. He does not seem to want to go back to jail.   Patient discussed with Dr. Eulis Foster. Patient appears competent to make medical decisions, even if those decisions are not in his best interest. Police at bedside and will take him back to prison.   Patient urged to take medications and to return with worsening symptoms or other concerns.   MDM   Final diagnoses:  Seizure   Patient with possible seizure vs malingering. Work-up shows subtherapeutic on prescribed antiepileptics. Unfortunately he is refusing to take medications here. Other than defiant demeanor, he is moving all extremities purposefully, GCS=15, and shows no signs of neurological deficit. No indications that he is not competent to make medical decisions. Work-up complete. He will be discharged to jail.   Chronic wound on thigh: does not appear infected. No concern for sepsis.     Carlisle Cater, PA-C 07/09/13 2051

## 2013-07-09 NOTE — ED Notes (Signed)
Bed: ME26 Expected date:  Expected time:  Means of arrival:  Comments: EMS-seizure

## 2013-07-09 NOTE — ED Provider Notes (Signed)
  Face-to-face evaluation   History: Multiple seizures today, admitted to a local jail 2 days ago.   Physical exam: He is obtunded, sedated, after Ativan treatment. He is responsive to painful stimuli in a purposeful manner  Medical screening examination/treatment/procedure(s) were conducted as a shared visit with non-physician practitioner(s) and myself.  I personally evaluated the patient during the encounter   Richarda Blade, MD 07/09/13 2315

## 2013-07-09 NOTE — Discharge Instructions (Signed)
Please read and follow all provided instructions.  Your diagnoses today include:  1. Seizure     Tests performed today include:  Tegretol level - low  Phenobarbital level - low  Blood counts and electrolytes  CT head - no problems  Vital signs. See below for your results today.   Medications prescribed:   None  Take any prescribed medications only as directed.  Home care instructions:  Follow any educational materials contained in this packet.  You need to take your medications to prevent yourself from having additional seizures.  Follow-up instructions: Please follow-up with your primary care provider in the next 3 days for further evaluation of your symptoms. If you do not have a primary care doctor -- see below for referral information.   Return instructions:   Please return to the Emergency Department if you experience worsening symptoms.   Please return if you have any other emergent concerns.  Additional Information:  Your vital signs today were: BP 117/61   Pulse 93   Temp(Src) 99 F (37.2 C) (Oral)   Resp 21   SpO2 97% If your blood pressure (BP) was elevated above 135/85 this visit, please have this repeated by your doctor within one month. --------------

## 2013-07-09 NOTE — ED Notes (Addendum)
Pt hx of HIV and bleeding from head. Pt trying to scratch staff, non complaint. Foaming at mouth, no incontinence noted. Pt not following commands and writhing around in bed. Close to hitting and kicking staff. Will be placed in restraints

## 2013-07-11 ENCOUNTER — Encounter: Payer: Self-pay | Admitting: Radiation Oncology

## 2013-07-11 NOTE — Progress Notes (Signed)
No show

## 2013-07-12 ENCOUNTER — Ambulatory Visit
Admission: RE | Admit: 2013-07-12 | Discharge: 2013-07-12 | Disposition: A | Discharge status: Home / Self Care | Payer: Medicare Other | Source: Ambulatory Visit | Attending: Radiation Oncology | Admitting: Radiation Oncology

## 2013-07-12 DIAGNOSIS — C3410 Malignant neoplasm of upper lobe, unspecified bronchus or lung: Secondary | ICD-10-CM

## 2013-07-16 ENCOUNTER — Telehealth: Payer: Self-pay | Admitting: *Deleted

## 2013-07-16 NOTE — Telephone Encounter (Signed)
Received call from pt's wife asking if a nurse will come out to the home to assess leg and dressing. Transferred to this RN in error, Mrs. Mendonca was asking to speak with Manuela Schwartz, radiation navigator. Call transferred.

## 2013-07-20 ENCOUNTER — Telehealth: Payer: Self-pay | Admitting: *Deleted

## 2013-07-20 ENCOUNTER — Ambulatory Visit (INDEPENDENT_AMBULATORY_CARE_PROVIDER_SITE_OTHER): Payer: Medicare Other | Admitting: Infectious Diseases

## 2013-07-20 ENCOUNTER — Encounter: Payer: Self-pay | Admitting: Infectious Diseases

## 2013-07-20 VITALS — BP 114/73 | HR 93 | Temp 97.6°F | Wt 134.0 lb

## 2013-07-20 DIAGNOSIS — B20 Human immunodeficiency virus [HIV] disease: Secondary | ICD-10-CM

## 2013-07-20 DIAGNOSIS — L97909 Non-pressure chronic ulcer of unspecified part of unspecified lower leg with unspecified severity: Secondary | ICD-10-CM

## 2013-07-20 DIAGNOSIS — G40909 Epilepsy, unspecified, not intractable, without status epilepticus: Secondary | ICD-10-CM

## 2013-07-20 DIAGNOSIS — R569 Unspecified convulsions: Secondary | ICD-10-CM

## 2013-07-20 DIAGNOSIS — C3410 Malignant neoplasm of upper lobe, unspecified bronchus or lung: Secondary | ICD-10-CM

## 2013-07-20 MED ORDER — RALTEGRAVIR POTASSIUM 400 MG PO TABS: 400.0000 mg | ORAL_TABLET | Freq: Two times a day (BID) | ORAL | Status: DC

## 2013-07-20 MED ORDER — CARBAMAZEPINE 200 MG PO TABS: ORAL_TABLET | ORAL | Status: DC

## 2013-07-20 MED ORDER — MEGESTROL ACETATE 400 MG/10ML PO SUSP: 400.0000 mg | Freq: Every day | ORAL | Status: DC

## 2013-07-20 MED ORDER — EMTRICITABINE-TENOFOVIR DF 200-300 MG PO TABS: 1.0000 | ORAL_TABLET | Freq: Every day | ORAL | Status: DC

## 2013-07-20 MED ORDER — OXYCODONE HCL 5 MG PO TABS: 5.0000 mg | ORAL_TABLET | ORAL | Status: DC | PRN

## 2013-07-20 MED ORDER — PHENOBARBITAL 64.8 MG PO TABS: 64.8000 mg | ORAL_TABLET | Freq: Every day | ORAL | Status: DC

## 2013-07-20 NOTE — Assessment & Plan Note (Addendum)
Will get him back on his ART and other rx's. Will change his ART to ISN/TRV so as not to interact with with his phenobarb and tegretol. Will see him back in 1 month.

## 2013-07-20 NOTE — Progress Notes (Signed)
Patient ID: Roger Walton, male   DOB: [birth year], 58 y.o.   MRN: [id number] HPI: Roger Walton is a 58 y.o. male who is here for his follow up.   Allergies: No Known Allergies  Vitals: Temp: 97.6 F (36.4 C) (03/31 0926) Temp src: Oral (03/31 0926) BP: 114/73 mmHg (03/31 0926) Pulse Rate: 93 (03/31 0926)  Past Medical History: Past Medical History  Diagnosis Date  . 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 HIV infection   . SOB (shortness of breath)   . Chronic pain in right shoulder     scapula and arm for 2 months   . Anxiety     Social History: History   Social History  . Marital Status: Married    Spouse Name: N/A    Number of Children: N/A  . Years of Education: N/A   Social History Main Topics  . Smoking status: Former Smoker -- 1.00 packs/day for 40 years    Types: Cigarettes  . Smokeless tobacco: Never Used     Comment: 07/03/12 states pt  . Alcohol Use: No  . Drug Use: Yes    Special: Marijuana  . Sexual Activity: Not Currently    Partners: Female     Comment: pt. requested condoms   Other Topics Concern  . None   Social History Narrative   Currently on disability for his seizure disorder   Has not worked since 2000 and 8          Previous Regimen:   Current Regimen: Atripla  Labs: HIV 1 RNA Quant (copies/mL)  Date Value  05/05/2013 82*  12/09/2012 <20   06/29/2012 54*     CD4 T Cell Abs (/uL)  Date Value  05/05/2013 100*  12/09/2012 120*  06/29/2012 160*     Hep B S Ab (no units)  Date Value  06/16/2006 NO      Hepatitis B Surface Ag (no units)  Date Value  06/16/2006 NO      HCV Ab (no units)  Date Value  06/16/2006 NO     CrCl: The CrCl is unknown because both a height and weight (above a minimum accepted value) are required for this calculation.  Lipids:    Component Value Date/Time   CHOL 151 06/29/2012 0958   TRIG 95 06/29/2012 0958   HDL 32*  06/29/2012 0958   CHOLHDL 4.7 06/29/2012 0958   VLDL 19 06/29/2012 0958   LDLCALC 100* 06/29/2012 0958    Assessment: He has been on Atripla for his HIV. However he has been off of it due to difficulty swallowing. He is also on both carbamezepine and phenobarb. These can interact with his Atripla. D/w Dr [name], we'll change him to raltegravir + Truvada due to the risk of interactions with his other meds. I really stress to him that he can't take the RAL once daily. His wife stated she'll keep him on track.  Recommendations: Dc Atripla Start RAL 400mg  PO BID Start TRV 1 PO qday  Wilfred Lacy, PharmD Clinical Infectious [facility] 07/20/2013, 1:17 PM

## 2013-07-20 NOTE — Telephone Encounter (Signed)
Patient's wife stated his medications today were too expensive to fill.  Spoke with the pharmacy.  Pt has reached his "doughnut hole." referred patient to Avelina Laine, RN

## 2013-07-20 NOTE — Telephone Encounter (Signed)
Called the [employer].  Mr. Figgs was approved for $4000.00 grant to cover his Truvada and [place].  Called Mrs. Stancil and gave her the information.

## 2013-07-20 NOTE — Progress Notes (Signed)
   Subjective:    Patient ID: Roger Walton, male    DOB: December 26, 1955, 58 y.o.   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 Has been receiving concurrent chemoradiation with weekly carboplatin and paclitaxol. Is off CTX and XRT- for several months. Had repeat CT scan 04-05-13: 1. Size increase in right lower lobe pulmonary nodule consistent with bronchogenic carcinoma.  2. Continued decrease in size of right apical tumor.  3. Stable splenic lesions. [name] this to be benign. Was seen in [place] on 3-5 and admitted with a large lesion on the back of his leg. He stayed in hospital until 3-9. This was found to be a metastasis. He also was found to have gangrene. He was treated with anbx for 14 days (vanco/zosyn --> doxy/augmentin).  Has been off his ART since (too big and hard to swallow). Now everything is going down perfect.   Leg feels a whole lot better. His wound is healing "slowly but surely". Has been "laying low" since. Still completing his anbx. Has been out of his phenobarb (has been seen in ED since for a sz). Also out of marinol.    Review of Systems     Objective:   Physical Exam  Constitutional: He appears cachectic.  Cardiovascular: Normal rate, regular rhythm and normal heart sounds.   Pulmonary/Chest: Effort normal and breath sounds normal.  Abdominal: Soft. Bowel sounds are normal. There is no tenderness.  Skin:             Assessment & Plan:

## 2013-07-20 NOTE — Assessment & Plan Note (Signed)
Has f/u with Dr Julien Nordmann next week. will get pt set up with palliative care.

## 2013-07-20 NOTE — Assessment & Plan Note (Signed)
Will refer him to [facility]

## 2013-07-20 NOTE — Assessment & Plan Note (Signed)
Will refer his phenobarb.

## 2013-07-25 ENCOUNTER — Encounter: Payer: Self-pay | Admitting: Radiation Oncology

## 2013-07-25 NOTE — Progress Notes (Signed)
  Radiation Oncology         [phone] ________________________________  Name: Roger Walton MRN: [id number]  Date: 07/26/2013  DOB: Jul 17, 1955  Follow-Up Visit Note  NO SHOW _____________________________________  Sheral Apley. Tammi Klippel, M.D.

## 2013-07-26 ENCOUNTER — Ambulatory Visit
Admission: RE | Admit: 2013-07-26 | Discharge: 2013-07-26 | Disposition: A | Discharge status: Home / Self Care | Payer: Medicare Other | Source: Ambulatory Visit | Attending: Radiation Oncology | Admitting: Radiation Oncology

## 2013-07-26 DIAGNOSIS — C3410 Malignant neoplasm of upper lobe, unspecified bronchus or lung: Secondary | ICD-10-CM

## 2013-07-27 ENCOUNTER — Telehealth: Payer: Self-pay | Admitting: *Deleted

## 2013-07-27 NOTE — Telephone Encounter (Signed)
Attempted to call pt to f/u on pt status on home and cell phone listed.  Unable to leave messages on either #.  SLJ

## 2013-07-28 ENCOUNTER — Telehealth: Payer: Self-pay | Admitting: Radiation Oncology

## 2013-07-28 ENCOUNTER — Telehealth: Payer: Self-pay | Admitting: *Deleted

## 2013-07-28 NOTE — Telephone Encounter (Signed)
Attempted to reach patient or his wife via phone assess status and confirm follow up appointment. Home number listed no longer accepts incoming calls. Phoned mobile number. No answer. Unable to leave a message because voicemail hasn't been set up.

## 2013-07-28 NOTE — Telephone Encounter (Signed)
Thinks they missed an appointment yesterday with Dr. Julien Nordmann. Need to reschedule.

## 2013-07-30 ENCOUNTER — Telehealth: Payer: Self-pay | Admitting: *Deleted

## 2013-07-30 ENCOUNTER — Other Ambulatory Visit: Payer: Self-pay | Admitting: *Deleted
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
## 2013-08-02 ENCOUNTER — Telehealth: Payer: Self-pay | Admitting: Radiation Oncology

## 2013-08-02 ENCOUNTER — Ambulatory Visit: Admission: RE | Admit: 2013-08-02 | Payer: Medicare Other | Source: Ambulatory Visit | Admitting: Radiation Oncology

## 2013-08-02 NOTE — Telephone Encounter (Signed)
No show for follow up. Phoned home spoke with wife, Jeannene Patella. Transferred to [place] to reschedule.

## 2013-08-04 ENCOUNTER — Encounter: Payer: Self-pay | Admitting: Radiation Oncology

## 2013-08-04 ENCOUNTER — Other Ambulatory Visit: Payer: Self-pay | Admitting: *Deleted

## 2013-08-04 ENCOUNTER — Telehealth: Payer: Self-pay | Admitting: *Deleted

## 2013-08-04 DIAGNOSIS — B20 Human immunodeficiency virus [HIV] disease: Secondary | ICD-10-CM

## 2013-08-04 DIAGNOSIS — C3410 Malignant neoplasm of upper lobe, unspecified bronchus or lung: Secondary | ICD-10-CM

## 2013-08-04 MED ORDER — OXYCODONE HCL 5 MG PO TABS: 5.0000 mg | ORAL_TABLET | ORAL | Status: DC | PRN

## 2013-08-04 NOTE — Telephone Encounter (Signed)
i called her and spoke to her thanks

## 2013-08-04 NOTE — Telephone Encounter (Signed)
Patient wife called to advise that he is out of his medication for pain. Asked Dr [name] if the patient could have another Rx for Oxy and was advised yes. Printed the Rx and advised the patient wife that going forward that the Hospice nurse will provide the Rx for pain.

## 2013-08-05 ENCOUNTER — Encounter: Payer: Self-pay | Admitting: *Deleted

## 2013-08-05 ENCOUNTER — Ambulatory Visit: Admission: RE | Admit: 2013-08-05 | Payer: Medicare Other | Source: Ambulatory Visit | Admitting: Radiation Oncology

## 2013-08-05 ENCOUNTER — Ambulatory Visit: Payer: Medicare Other | Admitting: Radiation Oncology

## 2013-08-05 ENCOUNTER — Telehealth: Payer: Self-pay | Admitting: Internal Medicine

## 2013-08-05 NOTE — Telephone Encounter (Signed)
per 4/10 pof r/s lb/ct/fu and call Roger Walton w/appt [phone]. not able to reach [place] or lm for her. appts are for lb/ct 4/20 and f/u 4/22 will call again.

## 2013-08-05 NOTE — Progress Notes (Signed)
CSW received call from [place], [facility] navigator, patient's spouse reports no transportation for tomorrow's radiation oncology appointment.  Planned tentative rescheduled appointment for 08/12/13 at 2 pm.  CSW contacted [facility] and requested transportation for 4/22(medical oncology) and 4/23 (radiation oncology) appointments.  CSW awaiting confirmation from [facility] for transportation.  CSW left voicemail with patient's spouse informing of current plan.  [name], MSW, LCSW, OSW-C Clinical Social Worker [facility] [phone]

## 2013-08-06 ENCOUNTER — Encounter: Payer: Self-pay | Admitting: *Deleted

## 2013-08-06 ENCOUNTER — Telehealth: Payer: Self-pay | Admitting: Internal Medicine

## 2013-08-06 NOTE — Telephone Encounter (Signed)
s/w Roger Walton re appts for lb/ct 4/20 and MM 4/22. pt will arrive @ [facility] 1:30pm to start waterbased prep and then come to [facility] for lab at 2:30 pm and return to [facility] to comeplete prep and ct.

## 2013-08-06 NOTE — Progress Notes (Signed)
[facility] Work  Clinical Social Work received word from the [facility] that pt and wife have transportation for appointments on 4/22 and 4/23. This CSW was unaware of an appt on 4/20. CSW received confirmation from Andres from [facility] that the driver would be [place]. This CSW called wife and confirmed she was aware of this plan, she feels they maybe able to find a ride for the appt on Monday.   Loren Racer, LCSW Clinical Social Worker [facility] [place] Wednesday, Thursday and Friday Phone: [phone] Fax: [phone]

## 2013-08-09 ENCOUNTER — Ambulatory Visit (HOSPITAL_COMMUNITY)
Admission: RE | Admit: 2013-08-09 | Discharge: 2013-08-09 | Disposition: A | Discharge status: Home / Self Care | Payer: Medicare Other | Source: Ambulatory Visit | Attending: Physician Assistant | Admitting: Physician Assistant

## 2013-08-09 ENCOUNTER — Encounter (HOSPITAL_COMMUNITY): Payer: Self-pay

## 2013-08-09 ENCOUNTER — Other Ambulatory Visit (HOSPITAL_BASED_OUTPATIENT_CLINIC_OR_DEPARTMENT_OTHER): Payer: Medicare Other

## 2013-08-09 ENCOUNTER — Other Ambulatory Visit: Payer: Self-pay | Admitting: Physician Assistant

## 2013-08-09 ENCOUNTER — Telehealth: Payer: Self-pay | Admitting: *Deleted

## 2013-08-09 ENCOUNTER — Encounter: Payer: Self-pay | Admitting: *Deleted

## 2013-08-09 DIAGNOSIS — C3490 Malignant neoplasm of unspecified part of unspecified bronchus or lung: Secondary | ICD-10-CM

## 2013-08-09 DIAGNOSIS — C3410 Malignant neoplasm of upper lobe, unspecified bronchus or lung: Secondary | ICD-10-CM

## 2013-08-09 LAB — COMPREHENSIVE METABOLIC PANEL (CC13)
ALT: 10 U/L (ref 0–55)
AST: 20 U/L (ref 5–34)
Albumin: 3.2 g/dL — ABNORMAL LOW (ref 3.5–5.0)
Alkaline Phosphatase: 91 U/L (ref 40–150)
Anion Gap: 8 mEq/L (ref 3–11)
BUN: 10 mg/dL (ref 7.0–26.0)
CALCIUM: 9.9 mg/dL (ref 8.4–10.4)
CO2: 23 meq/L (ref 22–29)
CREATININE: 0.9 mg/dL (ref 0.7–1.3)
Chloride: 100 mEq/L (ref 98–109)
Glucose: 126 mg/dl (ref 70–140)
Potassium: 4.2 mEq/L (ref 3.5–5.1)
Sodium: 132 mEq/L — ABNORMAL LOW (ref 136–145)
Total Bilirubin: 0.3 mg/dL (ref 0.20–1.20)
Total Protein: 7.9 g/dL (ref 6.4–8.3)

## 2013-08-09 LAB — CBC WITH DIFFERENTIAL/PLATELET
BASO%: 0.5 % (ref 0.0–2.0)
BASOS ABS: 0 10*3/uL (ref 0.0–0.1)
EOS%: 0.3 % (ref 0.0–7.0)
Eosinophils Absolute: 0 10*3/uL (ref 0.0–0.5)
HEMATOCRIT: 35.6 % — AB (ref 38.4–49.9)
HGB: 12.2 g/dL — ABNORMAL LOW (ref 13.0–17.1)
LYMPH%: 20.7 % (ref 14.0–49.0)
MCH: 35.1 pg — AB (ref 27.2–33.4)
MCHC: 34.3 g/dL (ref 32.0–36.0)
MCV: 102.3 fL — ABNORMAL HIGH (ref 79.3–98.0)
MONO#: 0.6 10*3/uL (ref 0.1–0.9)
MONO%: 17.8 % — ABNORMAL HIGH (ref 0.0–14.0)
NEUT#: 1.9 10*3/uL (ref 1.5–6.5)
NEUT%: 60.7 % (ref 39.0–75.0)
PLATELETS: 280 10*3/uL (ref 140–400)
RBC: 3.48 10*6/uL — ABNORMAL LOW (ref 4.20–5.82)
RDW: 13.8 % (ref 11.0–14.6)
WBC: 3.1 10*3/uL — ABNORMAL LOW (ref 4.0–10.3)
lymph#: 0.6 10*3/uL — ABNORMAL LOW (ref 0.9–3.3)

## 2013-08-09 MED ORDER — IOHEXOL 300 MG/ML  SOLN
50.0000 mL | Freq: Once | INTRAMUSCULAR | Status: DC | PRN
Start: 2013-08-09 — End: 2013-08-10

## 2013-08-09 MED ORDER — IOHEXOL 300 MG/ML  SOLN: 100.0000 mL | Freq: Once | INTRAMUSCULAR | Status: AC | PRN

## 2013-08-09 NOTE — Progress Notes (Signed)
Clinical Social Worker contacted pt's wife at home to follow up regarding transportation.  Pt has transportation arranged for his appointments on 4/22 and 4/23 through [facility].  Pt's wife confirmed that her daughter was able to provide transportation to pt's appointment this afternoon.    Johnnye Lana, MSW, [place] Worker [facility] [phone]

## 2013-08-09 NOTE — Telephone Encounter (Signed)
Patient walked in office today to speak with nurse regarding left leg pain at previous ulceration. He wanted to see if he could get a shot of pain meds while he was here going to Radiology for his CT scan. Informed patient that we do not give shots for pain like the ER would administer. Patient does not appear to be in any acute pain crisis at this time. Patient does have prescription for Oxycodone 5mg  at home filled by Dr [name] on 08/04/2013. Patient states that leg ulcer looks good, it is healing. Denies any drainage, heat or swelling around the site. Informed patient he should continue with pain meds as prescribed, suggested use of intermittent warm compresses to assist with pain. Patient and wife understand to return to office on 4/22 for results of CT scan.  If he has any other issues, he is to see his Primary/ID physician, Dr [name].

## 2013-08-11 ENCOUNTER — Ambulatory Visit (HOSPITAL_BASED_OUTPATIENT_CLINIC_OR_DEPARTMENT_OTHER): Payer: Medicare Other | Admitting: Internal Medicine

## 2013-08-11 ENCOUNTER — Telehealth: Payer: Self-pay | Admitting: Internal Medicine

## 2013-08-11 ENCOUNTER — Other Ambulatory Visit: Payer: Self-pay | Admitting: Infectious Diseases

## 2013-08-11 ENCOUNTER — Encounter: Payer: Self-pay | Admitting: Internal Medicine

## 2013-08-11 VITALS — BP 131/63 | HR 91 | Temp 98.8°F | Resp 20 | Ht 69.0 in | Wt 138.0 lb

## 2013-08-11 DIAGNOSIS — B20 Human immunodeficiency virus [HIV] disease: Secondary | ICD-10-CM

## 2013-08-11 DIAGNOSIS — C779 Secondary and unspecified malignant neoplasm of lymph node, unspecified: Secondary | ICD-10-CM

## 2013-08-11 DIAGNOSIS — C50919 Malignant neoplasm of unspecified site of unspecified female breast: Secondary | ICD-10-CM

## 2013-08-11 DIAGNOSIS — C3410 Malignant neoplasm of upper lobe, unspecified bronchus or lung: Secondary | ICD-10-CM

## 2013-08-11 DIAGNOSIS — R911 Solitary pulmonary nodule: Secondary | ICD-10-CM

## 2013-08-11 NOTE — Telephone Encounter (Signed)
gv and printed apts sched and avs for pt for May....gv pt barium

## 2013-08-11 NOTE — Progress Notes (Signed)
[place] [phone]   [phone]  OFFICE PROGRESS NOTE  Bobby Rumpf, MD [address]  [address] [place] [address]  DIAGNOSIS: Metastatic non-small cell lung cancer, squamous cell carcinoma initially diagnosed asStage IIB/IIIA  presenting as a right Pancoast tumor diagnosed in March of 2014.  PRIOR THERAPY:   1) Concurrent chemoradiation with weekly carboplatin for AUC of 2 and paclitaxol 45 mg/M2, status post 5 weekly doses. Last dose was given on 08/24/2012 with no significant response.  2) Systemic chemotherapy with carboplatin for AUC of 5 on day 1 and gemcitabine 1000 mg/M2 on days 1 and 8 every 3 weeks. First dose given on 10/28/2012. Status post day 1 of cycle 1, day 8 held secondary to neutropenia. Also status post day 1 of cycle 2. 3) status post stereotactic radiotherapy to enlarging right lung nodules under the care of Dr. Tammi Klippel.  CURRENT THERAPY:  None.  INTERVAL HISTORY: Roger Walton 58 y.o. male returns to the clinic today for followup visit. The patient was admitted to [facility] on 06/24/2013 with ulcer of the left buttock he was seen by surgery and underwent debridement of the wound. He was also seen by infectious disease and the culture grew predominantly staph,coagulase-negative, mixed aerobic and anaerobic infection. He was treated with Zosyn, Augmentin and doxycycline. The final pathology of the debrided wound from the left buttock area (Accession: [id number]) showed moderate to poorly differentiated squamous cell carcinoma with extensive necrosis. The wound is healing slowly. The patient denied having any other significant complaints. He denied having any fever or chills. He denied having any nausea or vomiting. The patient denied having any significant cough or hemoptysis. He is here today for evaluation and discussion of his treatment options.  MEDICAL HISTORY: Past Medical  History  Diagnosis Date  . 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 HIV infection   . SOB (shortness of breath)   . Chronic pain in right shoulder     scapula and arm for 2 months   . Anxiety     ALLERGIES:  has No Known Allergies.  MEDICATIONS:  Current Outpatient Prescriptions  Medication Sig Dispense Refill  . carbamazepine (TEGRETOL) 200 MG tablet take 3 tablets by mouth every morning 2 AT NOON and 3 tablets by mouth at bedtime  720 tablet  2  . gabapentin (NEURONTIN) 300 MG capsule Take 1 capsule (300 mg total) by mouth 3 (three) times daily.  90 capsule  5  . megestrol (MEGACE) 400 MG/10ML suspension Take 10 mLs (400 mg total) by mouth daily.  240 mL  0  . oxyCODONE (OXY IR/ROXICODONE) 5 MG immediate release tablet Take 1 tablet (5 mg total) by mouth every 4 (four) hours as needed for severe pain.  30 tablet  0  . PHENobarbital (LUMINAL) 64.8 MG tablet Take 1 tablet (64.8 mg total) by mouth at bedtime.  90 tablet  2  . raltegravir (ISENTRESS) 400 MG tablet Take 1 tablet (400 mg total) by mouth 2 (two) times daily.  120 tablet  3  . amoxicillin-clavulanate (AUGMENTIN) 875-125 MG per tablet Take 1 tablet by mouth every 12 (twelve) hours.  22 tablet  0  . collagenase (SANTYL) ointment Apply topically daily.  15 g  0  . doxycycline (VIBRA-TABS) 100 MG tablet Take 1 tablet (100 mg total) by  mouth every 12 (twelve) hours.  22 tablet  0  . emtricitabine-tenofovir (TRUVADA) 200-300 MG per tablet Take 1 tablet by mouth daily.  30 tablet  11   No current facility-administered medications for this visit.    REVIEW OF SYSTEMS:  A comprehensive review of systems was negative except for: Constitutional: positive for fatigue   PHYSICAL EXAMINATION: General appearance: alert, cooperative and no distress Head: Normocephalic, without obvious abnormality, atraumatic Neck: no adenopathy Lymph nodes: Cervical, supraclavicular, and  axillary nodes normal. Resp: clear to auscultation bilaterally Cardio: regular rate and rhythm, S1, S2 normal, no murmur, click, rub or gallop GI: soft, non-tender; bowel sounds normal; no masses,  no organomegaly Extremities: extremities normal, atraumatic, no cyanosis or edema Neurologic: Alert and oriented X 3, normal strength and tone. Normal symmetric reflexes. Normal coordination and gait  There was also a large at least 10 CM necrotic wound on the back of the left thigh close to the buttock area.  ECOG PERFORMANCE STATUS: 1 - Symptomatic but completely ambulatory  Blood pressure 131/63, pulse 91, temperature 98.8 F (37.1 C), temperature source Oral, resp. rate 20, height 5\' 9"  (1.753 m), weight 138 lb (62.596 kg).  LABORATORY DATA: Lab Results  Component Value Date   WBC 3.1* 08/09/2013   HGB 12.2* 08/09/2013   HCT 35.6* 08/09/2013   MCV 102.3* 08/09/2013   PLT 280 08/09/2013      Chemistry      Component Value Date/Time   NA 132* 08/09/2013 1340   NA 144 07/09/2013 1550   K 4.2 08/09/2013 1340   K 4.8 07/09/2013 1550   CL 99 07/09/2013 1550   CL 103 08/24/2012 1129   CO2 23 08/09/2013 1340   CO2 15* 07/09/2013 1550   BUN 10.0 08/09/2013 1340   BUN 21 07/09/2013 1550   CREATININE 0.9 08/09/2013 1340   CREATININE 1.14 07/09/2013 1550   CREATININE 0.95 05/05/2013 1530      Component Value Date/Time   CALCIUM 9.9 08/09/2013 1340   CALCIUM 10.0 07/09/2013 1550   ALKPHOS 91 08/09/2013 1340   ALKPHOS 90 07/09/2013 1550   AST 20 08/09/2013 1340   AST 35 07/09/2013 1550   ALT 10 08/09/2013 1340   ALT 16 07/09/2013 1550   BILITOT 0.30 08/09/2013 1340   BILITOT 0.3 07/09/2013 1550       RADIOGRAPHIC STUDIES:  ASSESSMENT AND PLAN: The patient is a very pleasant 58 years old African American male with Metastatic non-small cell lung cancer initially diagnosed as stage IIIA status post a course of concurrent chemoradiation followed by 2 cycles of chemotherapy was carboplatin and gemcitabine  discontinued secondary to intolerance and the patient also received stereotactic radiotherapy to her enlarging right lung nodules. He has been observation for the last few months but the patient developed posterior left lower extremity wound and the debridement showed metastatic squamous cell carcinoma. The wound is healing slowly. I have a lengthy discussion with the patient and his wife today about his condition and treatment options. I recommended for him to have repeat CT scan of the chest, abdomen and pelvis for restaging of his disease. The patient has a complicated history with HIV and recent cellulitis and necrotic tumor mass in the left buttock. I would see him back for followup visit in 2 weeks for evaluation and discussion of his treatment options based on the imaging results. He was also advised to call immediately if he has any concerning symptoms in the interval.   Disclaimer: This note  was dictated with voice recognition software. Similar sounding words can inadvertently be transcribed and may not be corrected upon review.  Curt Bears, MD 08/11/2013

## 2013-08-12 ENCOUNTER — Encounter: Payer: Self-pay | Admitting: Radiation Oncology

## 2013-08-12 ENCOUNTER — Ambulatory Visit
Admission: RE | Admit: 2013-08-12 | Discharge: 2013-08-12 | Disposition: A | Discharge status: Home / Self Care | Payer: Medicare Other | Source: Ambulatory Visit | Attending: Radiation Oncology | Admitting: Radiation Oncology

## 2013-08-12 ENCOUNTER — Ambulatory Visit: Payer: Medicare Other | Admitting: Radiation Oncology

## 2013-08-12 DIAGNOSIS — C3410 Malignant neoplasm of upper lobe, unspecified bronchus or lung: Secondary | ICD-10-CM

## 2013-08-12 NOTE — Progress Notes (Signed)
No show

## 2013-08-13 ENCOUNTER — Ambulatory Visit (INDEPENDENT_AMBULATORY_CARE_PROVIDER_SITE_OTHER): Payer: Medicare Other | Admitting: Surgery

## 2013-08-13 ENCOUNTER — Encounter (INDEPENDENT_AMBULATORY_CARE_PROVIDER_SITE_OTHER): Payer: Self-pay | Admitting: Surgery

## 2013-08-13 VITALS — BP 130/72 | HR 116 | Temp 98.0°F | Resp 18 | Ht 69.0 in | Wt 134.0 lb

## 2013-08-13 DIAGNOSIS — D0472 Carcinoma in situ of skin of left lower limb, including hip: Secondary | ICD-10-CM | POA: Insufficient documentation

## 2013-08-13 DIAGNOSIS — D0470 Carcinoma in situ of skin of unspecified lower limb, including hip: Secondary | ICD-10-CM

## 2013-08-13 NOTE — Progress Notes (Addendum)
Re:   Roger Walton DOB:   1955/11/16 MRN:   [id number]  ASSESSMENT AND PLAN: 1. 5 cm wound, left posterior leg  [photo at end of note]  Biopsy ([id number]) - 06/26/2013 Moderately to poorly differentiated squamous cell ca   This ulcer is larger than when we saw him on 06/24/2013.  I had a lengthy discussion with the patient and his wife.  They have a little trouble understanding the underlying nature of this ulcer, that it will not heal unless we can get clean margins, and with Roger Walton underlying medical issues, a wound may never heal.  Ideally wound be to excise the entire area back to clear margins.  Plan:  He is seeing Dr. Johnnye Walton on Tuesday, 4/28.  I will review this with Dr. Johnnye Walton and then schedule surgery after that, if everyone is agreeing.  I wrote him for oxycodone, 5 mg, #20.  (He says that he was out of meds)  [Reviewed the CT scan of left buttocks/leg (06/25/2013) with Dr. Suzy Walton.  The left thigh lesion was about 2.8 cm on 06/25/2013.  It sits on the gluteus maximus muscle.  There are no neurovascular bundles nearby.  The area is clearly larger now - about 5 cm.  DN 08/16/2013]  2. HIV   Followed by Roger Walton 3. 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  Last chemotherapy was last spring and summer.  I spoke to Dr. Earlie Server.  He says that he has some immune therapy he would like to try and it would be beneficial to have this wound healing prior to starting any treatment.  Followed by Dr. Antionette Poles. Roger Walton 4. Seizure disorder -  Since childhood.  Roger Walton does not have a neurologist. 5.  Anxiety  Chief Complaint  Patient presents with  . New Evaluation    colon ca   REFERRING PHYSICIAN: Bobby Rumpf, MD  HISTORY OF PRESENT ILLNESS: Roger Walton is a 58 y.o. (DOB: [birth year])  AA  male whose primary care physician is  Roger Rumpf, MD and comes to me today for left leg ulcer that on biopsy showed squamous cell ca. He is accompanied by his wife, Roger Walton.  He is having chronic pain in the ulcer.  He is trying to take care of it.  He thinks it looks bette than when I saw it in the hospital, but I think that it looks worse. He is on antibiotics, but I am not sure why.  He cannot tell me.  CT femur left - 06/25/2013 - Large subcutaneous soft tissue defect noted posteriorly to the level  of the fascia. No drainable fluid collection. CT abdomen - 06/25/2013 - Stable changes in the right adrenal gland.  Slight increase in size of a hypodense lesion within the spleen.  This previously showed no significant activity on PET-CT seen.  Stable appearing subcutaneous lesion in the posterior left buttock.  This may represent a sebaceous cyst. Lymphadenopathy within the left inguinal region. This may be   reactive in nature but may be related to the known ulcerative lesion within the thigh. . CT head - 07/09/2013 - Negative    Past Medical History  Diagnosis Date  . 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 HIV infection   . SOB (  shortness of breath)   . Chronic pain in right shoulder     scapula and arm for 2 months   . Anxiety     Past Surgical History  Procedure Laterality Date  . Fine needle aspiration       Current Outpatient Prescriptions  Medication Sig Dispense Refill  . carbamazepine (TEGRETOL) 200 MG tablet take 3 tablets by mouth every morning 2 AT NOON and 3 tablets by mouth at bedtime  720 tablet  2  . collagenase (SANTYL) ointment Apply topically daily.  15 g  0  . emtricitabine-tenofovir (TRUVADA) 200-300 MG per tablet Take 1 tablet by mouth daily.  30 tablet  11  . gabapentin (NEURONTIN) 300 MG capsule Take 1 capsule (300 mg total) by mouth 3 (three) times daily.  90 capsule  5  . megestrol (MEGACE) 40 MG/ML suspension take 10 milliliters  (2 teaspoonfuls) by mouth once daily  240 mL  2  . oxyCODONE (OXY IR/ROXICODONE) 5 MG immediate release tablet Take 1 tablet (5 mg total) by mouth every 4 (four) hours as needed for severe pain.  30 tablet  0  . PHENobarbital (LUMINAL) 64.8 MG tablet Take 1 tablet (64.8 mg total) by mouth at bedtime.  90 tablet  2  . raltegravir (ISENTRESS) 400 MG tablet Take 1 tablet (400 mg total) by mouth 2 (two) times daily.  120 tablet  3  . amoxicillin-clavulanate (AUGMENTIN) 875-125 MG per tablet Take 1 tablet by mouth every 12 (twelve) hours.  22 tablet  0  . doxycycline (VIBRA-TABS) 100 MG tablet Take 1 tablet (100 mg total) by mouth every 12 (twelve) hours.  22 tablet  0   No current facility-administered medications for this visit.     No Known Allergies  REVIEW OF SYSTEMS: Skin:  No history of rash.  No history of abnormal moles. Infection:  Has HIV.  Followed by Roger Walton. Neurologic:  History of seizures since childhood.  Reason unclear.  No neurologist. Cardiac:  No history of hypertension. No history of heart disease.  No history of prior cardiac catheterization.  No history of seeing a cardiologist. 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  Followed by Drs. Mohammed and Laughlin.  Endocrine:  No diabetes. No thyroid disease. Gastrointestinal:  No history of stomach disease.  No history of liver disease.  No history of gall bladder disease.  No history of pancreas disease.  No history of colon disease. Urologic:  No history of kidney stones.  No history of bladder infections. Musculoskeletal:  No history of joint or back disease. Hematologic:  No bleeding disorder.  No history of anemia.  Not anticoagulated. Psycho-social:  The patient is oriented.     SOCIAL and FAMILY HISTORY: Wife, Roger Walton, with patient.  PHYSICAL EXAM: BP 130/72  Pulse 116   Temp(Src) 98 F (36.7 C)  Resp 18  Ht 5\' 9"  (1.753 m)  Wt 134 lb (60.782 kg)  BMI 19.78 kg/m2  General: Thin AA M who is alert. HEENT: Normal. Pupils equal. Neck: Supple. No mass.  No thyroid mass. Lymph Nodes:  No supraclavicular or cervical nodes. Lungs: Clear to auscultation. Heart:  RRR. No murmur or rub. Abdomen: Soft. No mass. No tenderness. No hernia. Normal bowel sounds.   Extremities:  Left buttocks, 2.5 cm "cyst".  This appears benign on CT.  Left posterior leg - 5 cm hard ulcer [photo  below].  Tender.   Neurologic:  Grossly intact to motor and sensory function. Psychiatric: Behavior is normal.    Left posterior thigh ulcer.  DATA REVIEWED: Epic notes.  Alphonsa Overall, MD,  [facility], [address]  [place], [place]    [place] Phone:  [phone] FAX:  [phone]

## 2013-08-16 ENCOUNTER — Other Ambulatory Visit: Payer: Self-pay | Admitting: Infectious Diseases

## 2013-08-17 ENCOUNTER — Encounter: Payer: Self-pay | Admitting: Infectious Diseases

## 2013-08-17 ENCOUNTER — Ambulatory Visit (INDEPENDENT_AMBULATORY_CARE_PROVIDER_SITE_OTHER): Payer: Medicare Other | Admitting: Infectious Diseases

## 2013-08-17 ENCOUNTER — Other Ambulatory Visit: Payer: Self-pay | Admitting: *Deleted

## 2013-08-17 VITALS — BP 127/77 | HR 90 | Temp 98.8°F | Wt 138.0 lb

## 2013-08-17 DIAGNOSIS — G40909 Epilepsy, unspecified, not intractable, without status epilepticus: Secondary | ICD-10-CM

## 2013-08-17 DIAGNOSIS — D0470 Carcinoma in situ of skin of unspecified lower limb, including hip: Secondary | ICD-10-CM

## 2013-08-17 DIAGNOSIS — D0472 Carcinoma in situ of skin of left lower limb, including hip: Secondary | ICD-10-CM

## 2013-08-17 DIAGNOSIS — R569 Unspecified convulsions: Secondary | ICD-10-CM

## 2013-08-17 DIAGNOSIS — C3410 Malignant neoplasm of upper lobe, unspecified bronchus or lung: Secondary | ICD-10-CM

## 2013-08-17 DIAGNOSIS — B20 Human immunodeficiency virus [HIV] disease: Secondary | ICD-10-CM

## 2013-08-17 MED ORDER — OXYCODONE HCL 5 MG PO TABS: 5.0000 mg | ORAL_TABLET | ORAL | Status: DC | PRN

## 2013-08-17 MED ORDER — CARBAMAZEPINE 200 MG PO TABS: ORAL_TABLET | ORAL | Status: DC

## 2013-08-17 MED ORDER — EFAVIRENZ-EMTRICITAB-TENOFOVIR 600-200-300 MG PO TABS: 1.0000 | ORAL_TABLET | Freq: Every day | ORAL | Status: DC

## 2013-08-17 MED ORDER — EMTRICITABINE-TENOFOVIR DF 200-300 MG PO TABS: 1.0000 | ORAL_TABLET | Freq: Every day | ORAL | Status: DC

## 2013-08-17 MED ORDER — MEGESTROL ACETATE 40 MG/ML PO SUSP: ORAL | Status: DC

## 2013-08-17 NOTE — Assessment & Plan Note (Signed)
Will refill his megace, pain meds. New rx for ibuprofen, re-iterated that he should take with food. Will have palliative care see him again.

## 2013-08-17 NOTE — Progress Notes (Signed)
   Subjective:    Patient ID: Roger Walton, male    DOB: 11-25-1955, 58 y.o.   MRN: [id number]  HPI Subjective:    58 yo M with HIV+ (prev on atripla, previous PI mutations), Stage IIB/IIIA non-small cell lung cancer, squamous cell carcinoma presenting as a right Pancoast tumor January 2014. Has been receiving concurrent chemoradiation with weekly carboplatin and paclitaxol. Is off CTX and XRT- for several months. Had repeat CT scan 04-05-13: 1. Size increase in right lower lobe pulmonary nodule consistent with bronchogenic carcinoma.  2. Continued decrease in size of right apical tumor.  3. Stable splenic lesions. [name] this to be benign.  Was seen in [place] on 3-5 and admitted with a large lesion on the back of his leg. He stayed in hospital until 3-9 and this lesion was resected. This was found to be a metastasis and gangrene. He was treated with anbx for 14 days (vanco/zosyn --> doxy/augmentin).  At his previous visit his meds were changed due to concerns about drug rash(atripla --> TRV/ISN). His rash has been unchanged.  Has continued pain today (8/10). Pain is in leg and in upper back and into R arm.  He is planned for further I & D of his LLE. He and his wife have questions about this.  Appetite is much better now on megace.   Review of Systems     Objective:   Physical Exam  Constitutional: He appears well-developed and well-nourished.  HENT:  Mouth/Throat: No oropharyngeal exudate.  Eyes: EOM are normal. Pupils are equal, round, and reactive to light.  Neck: Neck supple.  Cardiovascular: Normal rate, regular rhythm and normal heart sounds.   Pulmonary/Chest: Effort normal.  Abdominal: Soft. Bowel sounds are normal. He exhibits no distension. There is no tenderness.  Musculoskeletal:       Legs: Lymphadenopathy:    He has no cervical adenopathy.          Assessment & Plan:

## 2013-08-17 NOTE — Assessment & Plan Note (Signed)
Will refill his tegretol.

## 2013-08-17 NOTE — Assessment & Plan Note (Signed)
Spoke with Dr Lucia Gaskins this AM. Will get him in for further I & D.

## 2013-08-17 NOTE — Assessment & Plan Note (Signed)
Will change his ART back to atripla (they have several unopened bottles at home). His wife will getHIV tested today in lab. Will see him back in 6-8 weeks.

## 2013-08-18 ENCOUNTER — Other Ambulatory Visit (INDEPENDENT_AMBULATORY_CARE_PROVIDER_SITE_OTHER): Payer: Self-pay | Admitting: Surgery

## 2013-08-20 ENCOUNTER — Emergency Department (HOSPITAL_COMMUNITY): Payer: Medicare Other

## 2013-08-20 ENCOUNTER — Other Ambulatory Visit: Payer: Self-pay

## 2013-08-20 ENCOUNTER — Inpatient Hospital Stay (HOSPITAL_COMMUNITY)
Admission: EM | Admit: 2013-08-20 | Discharge: 2013-08-22 | DRG: 947 | Disposition: A | Discharge status: Home / Self Care | Payer: Medicare Other | Attending: Internal Medicine | Admitting: Internal Medicine

## 2013-08-20 ENCOUNTER — Encounter (HOSPITAL_COMMUNITY): Payer: Self-pay | Admitting: Emergency Medicine

## 2013-08-20 DIAGNOSIS — R569 Unspecified convulsions: Secondary | ICD-10-CM | POA: Diagnosis present

## 2013-08-20 DIAGNOSIS — I951 Orthostatic hypotension: Secondary | ICD-10-CM | POA: Diagnosis not present

## 2013-08-20 DIAGNOSIS — Z79899 Other long term (current) drug therapy: Secondary | ICD-10-CM

## 2013-08-20 DIAGNOSIS — F1210 Cannabis abuse, uncomplicated: Secondary | ICD-10-CM | POA: Diagnosis present

## 2013-08-20 DIAGNOSIS — Z9119 Patient's noncompliance with other medical treatment and regimen: Secondary | ICD-10-CM

## 2013-08-20 DIAGNOSIS — Z87891 Personal history of nicotine dependence: Secondary | ICD-10-CM

## 2013-08-20 DIAGNOSIS — Z9221 Personal history of antineoplastic chemotherapy: Secondary | ICD-10-CM

## 2013-08-20 DIAGNOSIS — F411 Generalized anxiety disorder: Secondary | ICD-10-CM | POA: Diagnosis present

## 2013-08-20 DIAGNOSIS — Z923 Personal history of irradiation: Secondary | ICD-10-CM

## 2013-08-20 DIAGNOSIS — R4182 Altered mental status, unspecified: Principal | ICD-10-CM | POA: Diagnosis present

## 2013-08-20 DIAGNOSIS — B20 Human immunodeficiency virus [HIV] disease: Secondary | ICD-10-CM | POA: Diagnosis present

## 2013-08-20 DIAGNOSIS — T421X1A Poisoning by iminostilbenes, accidental (unintentional), initial encounter: Secondary | ICD-10-CM | POA: Diagnosis present

## 2013-08-20 DIAGNOSIS — T4275XA Adverse effect of unspecified antiepileptic and sedative-hypnotic drugs, initial encounter: Secondary | ICD-10-CM | POA: Diagnosis present

## 2013-08-20 DIAGNOSIS — Z800 Family history of malignant neoplasm of digestive organs: Secondary | ICD-10-CM

## 2013-08-20 DIAGNOSIS — T451X5A Adverse effect of antineoplastic and immunosuppressive drugs, initial encounter: Secondary | ICD-10-CM

## 2013-08-20 DIAGNOSIS — Z91199 Patient's noncompliance with other medical treatment and regimen due to unspecified reason: Secondary | ICD-10-CM

## 2013-08-20 DIAGNOSIS — E43 Unspecified severe protein-calorie malnutrition: Secondary | ICD-10-CM | POA: Diagnosis present

## 2013-08-20 DIAGNOSIS — D701 Agranulocytosis secondary to cancer chemotherapy: Secondary | ICD-10-CM

## 2013-08-20 DIAGNOSIS — D0472 Carcinoma in situ of skin of left lower limb, including hip: Secondary | ICD-10-CM

## 2013-08-20 DIAGNOSIS — C3410 Malignant neoplasm of upper lobe, unspecified bronchus or lung: Secondary | ICD-10-CM | POA: Diagnosis present

## 2013-08-20 DIAGNOSIS — T426X1A Poisoning by other antiepileptic and sedative-hypnotic drugs, accidental (unintentional), initial encounter: Secondary | ICD-10-CM

## 2013-08-20 DIAGNOSIS — G40909 Epilepsy, unspecified, not intractable, without status epilepticus: Secondary | ICD-10-CM | POA: Diagnosis present

## 2013-08-20 DIAGNOSIS — D0470 Carcinoma in situ of skin of unspecified lower limb, including hip: Secondary | ICD-10-CM

## 2013-08-20 LAB — CBC WITH DIFFERENTIAL/PLATELET
Basophils Absolute: 0 10*3/uL (ref 0.0–0.1)
Basophils Relative: 0 % (ref 0–1)
EOS ABS: 0 10*3/uL (ref 0.0–0.7)
Eosinophils Relative: 0 % (ref 0–5)
HEMATOCRIT: 33.7 % — AB (ref 39.0–52.0)
HEMOGLOBIN: 11.9 g/dL — AB (ref 13.0–17.0)
LYMPHS ABS: 0.7 10*3/uL (ref 0.7–4.0)
Lymphocytes Relative: 18 % (ref 12–46)
MCH: 34.6 pg — ABNORMAL HIGH (ref 26.0–34.0)
MCHC: 35.3 g/dL (ref 30.0–36.0)
MCV: 98 fL (ref 78.0–100.0)
MONOS PCT: 12 % (ref 3–12)
Monocytes Absolute: 0.5 10*3/uL (ref 0.1–1.0)
NEUTROS ABS: 2.9 10*3/uL (ref 1.7–7.7)
Neutrophils Relative %: 70 % (ref 43–77)
Platelets: 249 10*3/uL (ref 150–400)
RBC: 3.44 MIL/uL — AB (ref 4.22–5.81)
RDW: 13 % (ref 11.5–15.5)
WBC: 4.1 10*3/uL (ref 4.0–10.5)

## 2013-08-20 LAB — COMPREHENSIVE METABOLIC PANEL
ALBUMIN: 2.9 g/dL — AB (ref 3.5–5.2)
ALK PHOS: 84 U/L (ref 39–117)
ALT: 13 U/L (ref 0–53)
AST: 20 U/L (ref 0–37)
BUN: 15 mg/dL (ref 6–23)
CHLORIDE: 103 meq/L (ref 96–112)
CO2: 26 mEq/L (ref 19–32)
CREATININE: 0.8 mg/dL (ref 0.50–1.35)
Calcium: 9.4 mg/dL (ref 8.4–10.5)
GLUCOSE: 97 mg/dL (ref 70–99)
Potassium: 4.2 mEq/L (ref 3.7–5.3)
Sodium: 140 mEq/L (ref 137–147)
Total Protein: 7.2 g/dL (ref 6.0–8.3)

## 2013-08-20 LAB — CBG MONITORING, ED: Glucose-Capillary: 101 mg/dL — ABNORMAL HIGH (ref 70–99)

## 2013-08-20 LAB — CARBAMAZEPINE LEVEL, TOTAL: CARBAMAZEPINE LVL: 15.8 ug/mL — AB (ref 4.0–12.0)

## 2013-08-20 LAB — PHENOBARBITAL LEVEL: Phenobarbital: 4 ug/mL — ABNORMAL LOW (ref 15.0–40.0)

## 2013-08-20 MED ORDER — EFAVIRENZ-EMTRICITAB-TENOFOVIR 600-200-300 MG PO TABS
1.0000 | ORAL_TABLET | Freq: Every day | ORAL | Status: DC
  Administered 2013-08-21 (×2): 1 via ORAL
  Filled 2013-08-20 (×3): qty 1

## 2013-08-20 MED ORDER — HEPARIN SODIUM (PORCINE) 5000 UNIT/ML IJ SOLN
5000.0000 [IU] | Freq: Three times a day (TID) | INTRAMUSCULAR | Status: DC
  Administered 2013-08-21 – 2013-08-22 (×3): 5000 [IU] via SUBCUTANEOUS
  Filled 2013-08-20 (×7): qty 1

## 2013-08-20 MED ORDER — GABAPENTIN 300 MG PO CAPS
300.0000 mg | ORAL_CAPSULE | Freq: Three times a day (TID) | ORAL | Status: DC
  Administered 2013-08-21 – 2013-08-22 (×5): 300 mg via ORAL
  Filled 2013-08-20 (×7): qty 1

## 2013-08-20 MED ORDER — MEGESTROL ACETATE 40 MG/ML PO SUSP
400.0000 mg | Freq: Every day | ORAL | Status: DC
  Administered 2013-08-21 – 2013-08-22 (×2): 400 mg via ORAL
  Filled 2013-08-20 (×2): qty 10

## 2013-08-20 MED ORDER — PHENOBARBITAL 32.4 MG PO TABS
64.8000 mg | ORAL_TABLET | Freq: Every day | ORAL | Status: DC
  Administered 2013-08-21 (×2): 64.8 mg via ORAL
  Filled 2013-08-20 (×2): qty 2

## 2013-08-20 MED ORDER — OXYCODONE HCL 5 MG PO TABS
5.0000 mg | ORAL_TABLET | ORAL | Status: DC | PRN
  Filled 2013-08-20: qty 1

## 2013-08-20 NOTE — H&P (Addendum)
[facility] History and Physical  Roger Walton YKZ:[id number] DOB: 12/19/1955 DOA: 08/20/2013  Referring physician: EDP PCP: Roger Rumpf, MD   Chief Complaint: Seizure   HPI: Roger Walton is a 58 y.o. male who presents to the ED after seizure yesterday and today.  Since 1600 today he has also had AMS.  AMS consists of slurred speech and lethargy.  Wife notes he has been out of his phenobarbital for several days.  Work up in ED shows toxic tegratol level and low phenobarbital level.    Review of Systems: Unable to perform due to AMS.  Past Medical History  Diagnosis Date  . 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 HIV infection   . SOB (shortness of breath)   . Chronic pain in right shoulder     scapula and arm for 2 months   . Anxiety    Past Surgical History  Procedure Laterality Date  . Fine needle aspiration     Social History:  reports that he has quit smoking. His smoking use included Cigarettes. He has a 40 pack-year smoking history. He has never used smokeless tobacco. He reports that he uses illicit drugs (Marijuana). He reports that he does not drink alcohol.  No Known Allergies  Family History  Problem Relation Age of Onset  . Colon cancer Neg Hx      Prior to Admission medications   Medication Sig Start Date End Date Taking? Authorizing Provider  carbamazepine (TEGRETOL) 200 MG tablet take 3 tablets by mouth every morning 2 AT NOON and 3 tablets by mouth at bedtime 08/17/13  Yes Campbell Riches, MD  efavirenz-emtricitabine-tenofovir (ATRIPLA) [phone] MG per tablet Take 1 tablet by mouth at bedtime. 08/17/13  Yes Campbell Riches, MD  gabapentin (NEURONTIN) 300 MG capsule Take 1 capsule (300 mg total) by mouth 3 (three) times daily. 04/28/13  Yes Lora Paula, MD  megestrol (MEGACE) 40 MG/ML suspension take 10 milliliters (2 teaspoonfuls) by mouth once daily 08/17/13  Yes  Campbell Riches, MD  oxyCODONE (OXY IR/ROXICODONE) 5 MG immediate release tablet Take 1 tablet (5 mg total) by mouth every 4 (four) hours as needed for severe pain. 08/17/13  Yes Campbell Riches, MD  PHENobarbital (LUMINAL) 64.8 MG tablet Take 1 tablet (64.8 mg total) by mouth at bedtime. 07/20/13  Yes Campbell Riches, MD   Physical Exam: Filed Vitals:   08/20/13 2300  BP: 123/81  Pulse: 77  Temp:   Resp: 14    BP 123/81  Pulse 77  Temp(Src) 98.7 F (37.1 C) (Oral)  Resp 14  Ht 5' 9"  (1.753 m)  Wt 63.504 kg (140 lb)  BMI 20.67 kg/m2  SpO2 99%  General Appearance:    Lethargic with impaired coordination no distress, appears stated age  Head:    Normocephalic, atraumatic  Eyes:    PERRL, EOMI, sclera non-icteric        Nose:   Nares without drainage or epistaxis. Mucosa, turbinates normal  Throat:   Moist mucous membranes. Oropharynx without erythema or exudate.  Neck:   Supple. No carotid bruits.  No thyromegaly.  No lymphadenopathy.   Back:     No CVA tenderness, no spinal tenderness  Lungs:     Clear to auscultation bilaterally, without wheezes, rhonchi or rales  Chest wall:    No tenderness to palpitation  Heart:  Regular rate and rhythm without murmurs, gallops, rubs  Abdomen:     Soft, non-tender, nondistended, normal bowel sounds, no organomegaly  Genitalia:    deferred  Rectal:    deferred  Extremities:   No clubbing, cyanosis or edema.  Pulses:   2+ and symmetric all extremities  Skin:   Skin color, texture, turgor normal, no rashes or lesions  Lymph nodes:   Cervical, supraclavicular, and axillary nodes normal  Neurologic:   CNII-XII intact. Normal strength, sensation and reflexes      throughout    Labs on Admission:  Basic Metabolic Panel:  Recent Labs Lab 08/20/13 2147  NA 140  K 4.2  CL 103  CO2 26  GLUCOSE 97  BUN 15  CREATININE 0.80  CALCIUM 9.4   Liver Function Tests:  Recent Labs Lab 08/20/13 2147  AST 20  ALT 13  ALKPHOS 84   BILITOT <0.2*  PROT 7.2  ALBUMIN 2.9*   No results found for this basename: LIPASE, AMYLASE,  in the last 168 hours No results found for this basename: AMMONIA,  in the last 168 hours CBC:  Recent Labs Lab 08/20/13 2147  WBC 4.1  NEUTROABS 2.9  HGB 11.9*  HCT 33.7*  MCV 98.0  PLT 249   Cardiac Enzymes: No results found for this basename: CKTOTAL, CKMB, CKMBINDEX, TROPONINI,  in the last 168 hours  BNP (last 3 results) No results found for this basename: PROBNP,  in the last 8760 hours CBG:  Recent Labs Lab 08/20/13 2138  GLUCAP 101*    Radiological Exams on Admission: Ct Head Wo Contrast  08/20/2013   CLINICAL DATA:  Head pain.  EXAM: CT HEAD WITHOUT CONTRAST  TECHNIQUE: Contiguous axial images were obtained from the base of the skull through the vertex without intravenous contrast.  COMPARISON:  CT HEAD W/O CM dated 07/09/2013  FINDINGS: Mild diffuse cerebral atrophy. No ventricular dilatation. Mild patchy low-attenuation change in the deep white matter consistent with small vessel ischemia. No mass effect or midline shift. No abnormal extra-axial fluid collections. Gray-white matter junctions are distinct. Basal cisterns are not effaced. No evidence of acute intracranial hemorrhage. No depressed skull fractures. Visualized paranasal sinuses and mastoid air cells are not opacified.  IMPRESSION: No acute intracranial abnormalities. Mild chronic atrophy and small vessel ischemic changes.   Electronically Signed   By: Lucienne Capers M.D.   On: 08/20/2013 22:15   Dg Chest Portable 1 View  08/20/2013   CLINICAL DATA:  Altered mental status. Seizures. History of smoking.  EXAM: PORTABLE CHEST - 1 VIEW  COMPARISON:  CT CHEST W/CM dated 04/05/2013; DG CHEST 2 VIEW dated 07/18/2012  FINDINGS: Normal heart size and pulmonary vascularity. Pulmonary hyperinflation and scattered fibrosis consistent with emphysematous changes. Soft tissue opacification in the right apex is stable since prior  studies. Right lower lung pulmonary nodule seen on CT is not appreciated radiographically. No focal consolidation or airspace disease. No blunting of costophrenic angles. No pneumothorax. No significant change since previous study.  IMPRESSION: No active disease.  Unchanged chronic findings.   Electronically Signed   By: Lucienne Capers M.D.   On: 08/20/2013 22:08    EKG: Independently reviewed.  Assessment/Plan Principal Problem:   Tegretol toxicity Active Problems:   HIV DISEASE   SEIZURE DISORDER   Lung cancer, upper lobe   1. Tegretol toxicity - due to not taking phenobarbital, his normal dose of tegratol has made him supratheraputic and toxic on this.  Will hold tegratol for  a couple of doses, resume this after symptoms improve. 2. Seizures - resume phenobarbital, hold tegratol (toxic levels of this can actually cause seizures). 3. Lung CA - C7 met, and a skin met to his leg which is scheduled for surgery on Monday, CT head showed no evidence of brain met at this time, if he does not improve then would have low threshold for performing MRI. 4. HIV - managed by Dr. [name], continue Atripla.  Again doubt CNS involvement given that his presentation is classic for tegretol toxicity but would perform MRI if he doesn't improve quickly.  Informally curb sided Dr. Leonel Ramsay who made the above recommendations.  Code Status: Full Code  Family Communication: Wife at bedside Disposition Plan: Admit to obs   Time spent: 50 min  [facility] Pager [phone]  If 7AM-7PM, please contact the day team taking care of the patient [email or web address] Password [email or web address] 08/20/2013, 11:30 PM

## 2013-08-20 NOTE — ED Notes (Signed)
Wife arrived.  Pt to have surgery on cancerous lesion on posterior left leg.

## 2013-08-20 NOTE — ED Notes (Signed)
CBG 101

## 2013-08-20 NOTE — ED Provider Notes (Signed)
CSN: [id number]     Arrival date & time 08/20/13  2048 History   First MD Initiated Contact with Patient 08/20/13 2132     Chief Complaint  Patient presents with  . Altered Mental Status      HPI Pt has hx of  lung CA and as well as seizures. Wife reported to EMS seizure yesterday and again today, time unknown. Since 1600, pt has not been "acting right." When asked if he knows where he is, he sat straight up as if to jump from the bed.   Past Medical History  Diagnosis Date  . HIV infection dx'd 2008  . SOB (shortness of breath)   . Chronic pain in right shoulder     scapula and arm for 2 months   . Anxiety   . Protein-calorie malnutrition   . Tegretol toxicity     history of  . 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 Grand mal seizure 03/04/12  . Epileptic seizures     "I've had them all my life" (08/24/2013)   Past Surgical History  Procedure Laterality Date  . Fine needle aspiration    . Skin cancer excision Left 08/24/2013    wide excision cancer thigh/buttocks  . Wound debridement Left 06/24/2013    posterior thigh/notes 06/24/2013  . Melanoma excision Left 08/24/2013    Procedure: wide excision cancer of left buttock / leg ;  Surgeon: Shann Medal, MD;  Location: [facility] OR;  Service: General;  Laterality: Left;   Family History  Problem Relation Age of Onset  . Colon cancer Neg Hx    History  Substance Use Topics  . Smoking status: Former Smoker -- 1.00 packs/day for 43 years    Types: Cigarettes  . Smokeless tobacco: Never Used     Comment: 08/24/2013 "quit smoking cigarettes ~ 2 months ago"  . Alcohol Use: 3.6 oz/week    6 Cans of beer per week    Review of Systems  Unable to perform ROS: Mental status change      Allergies  Review of patient's allergies indicates no known allergies.  Home Medications   Prior to Admission medications   Medication Sig Start Date End Date  Taking? Authorizing Provider  carbamazepine (TEGRETOL) 200 MG tablet take 3 tablets by mouth every morning 2 AT NOON and 3 tablets by mouth at bedtime 08/17/13   Campbell Riches, MD  collagenase (SANTYL) ointment Apply topically daily. 06/28/13   Nita Sells, MD  efavirenz-emtricitabine-tenofovir (ATRIPLA) [phone] MG per tablet Take 1 tablet by mouth at bedtime. 08/17/13   Campbell Riches, MD  emtricitabine-tenofovir (TRUVADA) 200-300 MG per tablet Take 1 tablet by mouth daily. 08/17/13   Campbell Riches, MD  gabapentin (NEURONTIN) 300 MG capsule Take 1 capsule (300 mg total) by mouth 3 (three) times daily. 04/28/13   Lora Paula, MD  megestrol (MEGACE) 40 MG/ML suspension take 10 milliliters (2 teaspoonfuls) by mouth once daily 08/17/13   Campbell Riches, MD  oxyCODONE (OXY IR/ROXICODONE) 5 MG immediate release tablet Take 1 tablet (5 mg total) by mouth every 4 (four) hours as needed for severe pain. 08/17/13   Campbell Riches, MD  PHENobarbital (LUMINAL) 64.8 MG tablet Take 1 tablet (64.8 mg total) by mouth at bedtime. 07/20/13   Campbell Riches, MD   BP 104/67  Pulse 97  Temp(Src) 98.9 F (37.2 C) (  Oral)  Resp 18  Ht 5\' 9"  (1.753 m)  Wt 128 lb 12 oz (58.4 kg)  BMI 19.00 kg/m2  SpO2 96% Physical Exam  Nursing note and vitals reviewed. Constitutional: He appears well-developed and well-nourished. He appears lethargic. No distress.  HENT:  Head: Normocephalic and atraumatic.  Eyes: Pupils are equal, round, and reactive to light.  Neck: Normal range of motion.  Cardiovascular: Normal rate and intact distal pulses.   Pulmonary/Chest: No respiratory distress.  Abdominal: Normal appearance. He exhibits no distension.  Musculoskeletal: Normal range of motion.  Neurological: He appears lethargic. No cranial nerve deficit. Coordination abnormal. GCS eye subscore is 4. GCS verbal subscore is 4. GCS motor subscore is 5.  Skin: Skin is warm and dry. No rash noted.    ED  Course  Procedures (including critical care time) Labs Review Labs Reviewed  CBC WITH DIFFERENTIAL - Abnormal; Notable for the following:    RBC 3.44 (*)    Hemoglobin 11.9 (*)    HCT 33.7 (*)    MCH 34.6 (*)    All other components within normal limits  COMPREHENSIVE METABOLIC PANEL - Abnormal; Notable for the following:    Albumin 2.9 (*)    Total Bilirubin <0.2 (*)    All other components within normal limits  CARBAMAZEPINE LEVEL, TOTAL - Abnormal; Notable for the following:    Carbamazepine Lvl 15.8 (*)    All other components within normal limits  PHENOBARBITAL LEVEL - Abnormal; Notable for the following:    Phenobarbital 4.0 (*)    All other components within normal limits  CBG MONITORING, ED - Abnormal; Notable for the following:    Glucose-Capillary 101 (*)    All other components within normal limits  MRSA PCR SCREENING  CARBAMAZEPINE LEVEL, TOTAL  BASIC METABOLIC PANEL    Imaging Review       CT Head Wo Contrast (Final result)  Result time: 08/20/13 22:15:24    Final result by Rad Results In Interface (08/20/13 22:15:24)    Narrative:   CLINICAL DATA: Head pain.  EXAM: CT HEAD WITHOUT CONTRAST  TECHNIQUE: Contiguous axial images were obtained from the base of the skull through the vertex without intravenous contrast.  COMPARISON: CT HEAD W/O CM dated 07/09/2013  FINDINGS: Mild diffuse cerebral atrophy. No ventricular dilatation. Mild patchy low-attenuation change in the deep white matter consistent with small vessel ischemia. No mass effect or midline shift. No abnormal extra-axial fluid collections. Gray-white matter junctions are distinct. Basal cisterns are not effaced. No evidence of acute intracranial hemorrhage. No depressed skull fractures. Visualized paranasal sinuses and mastoid air cells are not opacified.  IMPRESSION: No acute intracranial abnormalities. Mild chronic atrophy and small vessel ischemic changes.   Electronically  Signed By: Lucienne Capers M.D. On: 08/20/2013 22:15      EKG Interpretation None     Patient was admitted Neurology consulted MDM   Final diagnoses:  Tegretol toxicity        Dot Lanes, MD 09/01/13 1330

## 2013-08-20 NOTE — ED Notes (Signed)
Reported tegretol level to Dr. Audie Pinto.

## 2013-08-20 NOTE — Progress Notes (Signed)
Report taken from [place], [place] in ED.

## 2013-08-20 NOTE — ED Notes (Signed)
Pt has hx of untreated lung CA and as well as seizures.  Wife reported to EMS seizure yesterday and again today, time unknown.  Since 1600, pt has not been "acting right."  When asked if he knows where he is, he sat straight up as if to jump from the bed.  He told me he was in the operating room.  Pt is apparently scheduled for some type of procedure on a leg on Monday but unclear what it is.  Wife poor historian.  Wife and daughter are to come to ED.

## 2013-08-21 ENCOUNTER — Observation Stay (HOSPITAL_COMMUNITY): Payer: Medicare Other

## 2013-08-21 DIAGNOSIS — B20 Human immunodeficiency virus [HIV] disease: Secondary | ICD-10-CM

## 2013-08-21 LAB — MRSA PCR SCREENING: MRSA by PCR: NEGATIVE

## 2013-08-21 LAB — CARBAMAZEPINE LEVEL, TOTAL: Carbamazepine Lvl: 5.5 ug/mL (ref 4.0–12.0)

## 2013-08-21 MED ORDER — SODIUM CHLORIDE 0.9 % IV SOLN
INTRAVENOUS | Status: DC
  Administered 2013-08-21: 1000 mL via INTRAVENOUS

## 2013-08-21 MED ORDER — BOOST PLUS PO LIQD
237.0000 mL | Freq: Two times a day (BID) | ORAL | Status: DC
  Administered 2013-08-21 – 2013-08-22 (×2): 237 mL via ORAL
  Filled 2013-08-21 (×6): qty 237

## 2013-08-21 NOTE — Progress Notes (Signed)
Utilization review complete 

## 2013-08-21 NOTE — Progress Notes (Signed)
[facility] PROGRESS NOTE   Roger Walton BDZ:[id number] DOB: 01-26-1956 DOA: 08/20/2013 PCP: Bobby Rumpf, MD  HPI/Subjective: Feels okay, sitting eating breakfast when was interviewed. Wife at bedside.  Assessment/Plan: Principal Problem:   Tegretol toxicity Active Problems:   HIV DISEASE   SEIZURE DISORDER   Lung cancer, upper lobe    Seizure -The records patient was not taking phenobarbital and only taking Tegretol. -Phenobarbital resumed, Tegretol held for now because of high levels. -No seizure-like activity while in the hospital.  Tegretol toxicity -Carbamazepine level is 15.8, check carbamazepine level again. -Restart Tegretol if level within therapeutic range which is 4-12 mcg/mL  HIV disease -Seems to be controlled, patient follows with ID as outpatient, continue HAART.  Lung cancer -Patient follows with Dr. Julien Nordmann as outpatient, received chemotherapy and radiation. -Check MRI of the brain to rule out any micro-metastasis.  Code Status: Full code Family Communication: Plan discussed with the patient. Disposition Plan: Remains inpatient   Consultants:  None  Procedures:  None  Antibiotics:  None   Objective: Filed Vitals:   08/21/13 0502  BP: 105/70  Pulse: 95  Temp: 98.7 F (37.1 C)  Resp: 16    Intake/Output Summary (Last 24 hours) at 08/21/13 1217 Last data filed at 08/21/13 1017  Gross per 24 hour  Intake    222 ml  Output    400 ml  Net   -178 ml   Filed Weights   08/20/13 2100 08/21/13 0009  Weight: 63.504 kg (140 lb) 58.4 kg (128 lb 12 oz)    Exam: General: Alert and awake, oriented x3, not in any acute distress. HEENT: anicteric sclera, pupils reactive to light and accommodation, EOMI CVS: S1-S2 clear, no murmur rubs or gallops Chest: clear to auscultation bilaterally, no wheezing, rales or rhonchi Abdomen: soft nontender, nondistended, normal bowel sounds, no organomegaly Extremities: no cyanosis, clubbing  or edema noted bilaterally Neuro: Cranial nerves II-XII intact, no focal neurological deficits  Data Reviewed: Basic Metabolic Panel:  Recent Labs Lab 08/20/13 2147  NA 140  K 4.2  CL 103  CO2 26  GLUCOSE 97  BUN 15  CREATININE 0.80  CALCIUM 9.4   Liver Function Tests:  Recent Labs Lab 08/20/13 2147  AST 20  ALT 13  ALKPHOS 84  BILITOT <0.2*  PROT 7.2  ALBUMIN 2.9*   No results found for this basename: LIPASE, AMYLASE,  in the last 168 hours No results found for this basename: AMMONIA,  in the last 168 hours CBC:  Recent Labs Lab 08/20/13 2147  WBC 4.1  NEUTROABS 2.9  HGB 11.9*  HCT 33.7*  MCV 98.0  PLT 249   Cardiac Enzymes: No results found for this basename: CKTOTAL, CKMB, CKMBINDEX, TROPONINI,  in the last 168 hours BNP (last 3 results) No results found for this basename: PROBNP,  in the last 8760 hours CBG:  Recent Labs Lab 08/20/13 2138  GLUCAP 101*    Micro Recent Results (from the past 240 hour(s))  MRSA PCR SCREENING     Status: None   Collection Time    08/21/13 12:26 AM      Result Value Ref Range Status   MRSA by PCR NEGATIVE  NEGATIVE Final   Comment:            The GeneXpert MRSA Assay (FDA     approved for NASAL specimens     only), is one component of a     comprehensive MRSA colonization     surveillance  program. It is not     intended to diagnose MRSA     infection nor to guide or     monitor treatment for     MRSA infections.     Studies: Ct Head Wo Contrast  08/20/2013   CLINICAL DATA:  Head pain.  EXAM: CT HEAD WITHOUT CONTRAST  TECHNIQUE: Contiguous axial images were obtained from the base of the skull through the vertex without intravenous contrast.  COMPARISON:  CT HEAD W/O CM dated 07/09/2013  FINDINGS: Mild diffuse cerebral atrophy. No ventricular dilatation. Mild patchy low-attenuation change in the deep white matter consistent with small vessel ischemia. No mass effect or midline shift. No abnormal extra-axial  fluid collections. Gray-white matter junctions are distinct. Basal cisterns are not effaced. No evidence of acute intracranial hemorrhage. No depressed skull fractures. Visualized paranasal sinuses and mastoid air cells are not opacified.  IMPRESSION: No acute intracranial abnormalities. Mild chronic atrophy and small vessel ischemic changes.   Electronically Signed   By: Lucienne Capers M.D.   On: 08/20/2013 22:15   Dg Chest Portable 1 View  08/20/2013   CLINICAL DATA:  Altered mental status. Seizures. History of smoking.  EXAM: PORTABLE CHEST - 1 VIEW  COMPARISON:  CT CHEST W/CM dated 04/05/2013; DG CHEST 2 VIEW dated 07/18/2012  FINDINGS: Normal heart size and pulmonary vascularity. Pulmonary hyperinflation and scattered fibrosis consistent with emphysematous changes. Soft tissue opacification in the right apex is stable since prior studies. Right lower lung pulmonary nodule seen on CT is not appreciated radiographically. No focal consolidation or airspace disease. No blunting of costophrenic angles. No pneumothorax. No significant change since previous study.  IMPRESSION: No active disease.  Unchanged chronic findings.   Electronically Signed   By: Lucienne Capers M.D.   On: 08/20/2013 22:08    Scheduled Meds: . efavirenz-emtricitabine-tenofovir  1 tablet Oral QHS  . gabapentin  300 mg Oral TID  . heparin  5,000 Units Subcutaneous 3 times per day  . megestrol  400 mg Oral Daily  . PHENobarbital  64.8 mg Oral QHS   Continuous Infusions:      Time spent: 35 minutes    Verlee Monte  [facility] Pager [phone] If 7PM-7AM, please contact night-coverage at [email or web address], password [facility] 08/21/2013, 12:17 PM  LOS: 1 day

## 2013-08-21 NOTE — Progress Notes (Signed)
INITIAL NUTRITION ASSESSMENT  DOCUMENTATION CODES Per approved criteria  -Severe malnutrition in the context of chronic illness   INTERVENTION: 1.  Supplements; Pt requests Boost instead of Ensure. RD to order BID. Add MVI daily. 2.  General healthful diet; encourage intake of foods and beverages as able.  RD to follow and assess for nutritional adequacy.    NUTRITION DIAGNOSIS: Increased nutrient needs related to wound healing as evidenced by estimated needs.   Goal: Intake to meet >90% of estimated nutrition needs.  Monitor:  weight trends, lab trends, I/O's, PO intake, supplement tolerance  Reason for Assessment: Malnutrition Screening Tool  58 y.o. 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 Workup showed tegretol toxicity.   Pt reports that he is eating really well at this time. Pt notes that he has had poor appetite PTA due to not being very active, but that he tries to keep food (especially fruit) around for meals and snacks. Cannot tolerate Ensure but will drink Boost.  RD reweighed pt using bedscale and found wt is 131 lbs. May need objective measurement such as standing scale if able.   Nutrition Focused Physical Exam: Subcutaneous Fat:  Orbital Region: moderate wasting Upper Arm Region: moderate wasting Thoracic and Lumbar Region: mild wasting  Muscle:  Temple Region: moderate wasting Clavicle Bone Region: severe wasting Clavicle and Acromion Bone Region: moderate wasting Scapular Bone Region: not assessed Dorsal Hand: mild wasting Patellar Region: severe wasting Anterior Thigh Region: severe wasting Posterior Calf Region: severe wasting  Edema: none  Pt continues to meet criteria for severe MALNUTRITION in the context of chronic illness as evidenced by severe muscle mass loss, ongoing poor PO intake of <75% x at least 1 month, as well as  continued wt loss- pt has lost an additional 2% in the past 2 months.  RD to continue to follow nutrition care plan. Height: Ht Readings from Last 1 Encounters:  08/21/13 5\' 9"  (1.753 m)    Weight: Wt Readings from Last 1 Encounters:  08/21/13 128 lb 12 oz (58.4 kg)    Ideal Body Weight: 160 lb  % Ideal Body Weight: 80%  Wt Readings from Last 10 Encounters:  08/21/13 128 lb 12 oz (58.4 kg)  08/17/13 138 lb (62.596 kg)  08/13/13 134 lb (60.782 kg)  08/11/13 138 lb (62.596 kg)  07/20/13 134 lb (60.782 kg)  06/24/13 134 lb 4.8 oz (60.918 kg)  06/24/13 133 lb (60.328 kg)  05/05/13 135 lb (61.236 kg)  04/28/13 133 lb 14.4 oz (60.737 kg)  04/06/13 141 lb 11.2 oz (64.275 kg)    Usual Body Weight: 140 lb  % Usual Body Weight: 91%  BMI:  Body mass index is 19 kg/(m^2). Normal weight  Estimated Nutritional Needs: Kcal: 1800 - 2000 Protein: at least 90 g daily Fluid: 1.8 - 2 liters  Skin: thigh wound  Diet Order: General  EDUCATION NEEDS: -No education needs identified at this time   Intake/Output Summary (Last 24 hours) at 08/21/13 1341 Last data filed at 08/21/13 1017  Gross per 24 hour  Intake    222 ml  Output    400 ml  Net   -178 ml    Last BM: 5/1  Labs:   Recent Labs Lab 08/20/13 2147  NA 140  K 4.2  CL 103  CO2 26  BUN 15  CREATININE 0.80  CALCIUM 9.4  GLUCOSE 97  CBG (last 3)   Recent Labs  08/20/13 2138  GLUCAP 101*    Scheduled Meds: . efavirenz-emtricitabine-tenofovir  1 tablet Oral QHS  . gabapentin  300 mg Oral TID  . heparin  5,000 Units Subcutaneous 3 times per day  . megestrol  400 mg Oral Daily  . PHENobarbital  64.8 mg Oral QHS    Continuous Infusions: . sodium chloride      Past Medical History  Diagnosis Date  . 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 HIV infection   . SOB (shortness of breath)   . Chronic pain in right shoulder      scapula and arm for 2 months   . Anxiety     Past Surgical History  Procedure Laterality Date  . Fine needle aspiration      Brynda Greathouse, MS RD LDN Clinical Inpatient Dietitian Pager: [phone] Weekend/After hours pager: [phone]

## 2013-08-21 NOTE — Progress Notes (Signed)
Patient alert and oriented to self and place, patient is unsure of month or date. Patient responses delayed, unsure if this is patient's baseline. Patient placed on bed alarm. Educated and demonstrated to patient and wife how to use call bell and room phone to get into contact with staff. Call bell and phone both within patient reach. Patient has hx of MRSA, PCR sent to lab, results pending. Placed on contact precautions. Patient has open wound to posterior upper thigh, measuring 5cmx6cmx2cm, scant drainage, and odorous. Patient's wife states, "that's where his cancer is. He's supposed to be having surgery on it Monday." Patient's wife stated that they cleanse it with normal saline and cover it with a dressing at home. RN cleansed with normal saline and covered with pink foam dressing. Will continue to monitor patient per MD orders.

## 2013-08-22 DIAGNOSIS — T451X5A Adverse effect of antineoplastic and immunosuppressive drugs, initial encounter: Secondary | ICD-10-CM

## 2013-08-22 DIAGNOSIS — R4182 Altered mental status, unspecified: Principal | ICD-10-CM

## 2013-08-22 DIAGNOSIS — D702 Other drug-induced agranulocytosis: Secondary | ICD-10-CM

## 2013-08-22 DIAGNOSIS — E43 Unspecified severe protein-calorie malnutrition: Secondary | ICD-10-CM | POA: Insufficient documentation

## 2013-08-22 LAB — BASIC METABOLIC PANEL
BUN: 20 mg/dL (ref 6–23)
CALCIUM: 9 mg/dL (ref 8.4–10.5)
CO2: 25 meq/L (ref 19–32)
CREATININE: 0.83 mg/dL (ref 0.50–1.35)
Chloride: 104 mEq/L (ref 96–112)
GFR calc Af Amer: >90 mL/min (ref >90)
GFR calc non Af Amer: >90 mL/min (ref >90)
Glucose, Bld: 95 mg/dL (ref 70–99)
Potassium: 4.1 mEq/L (ref 3.7–5.3)
Sodium: 139 mEq/L (ref 137–147)

## 2013-08-22 MED ORDER — CARBAMAZEPINE 200 MG PO TABS: ORAL_TABLET | ORAL | Status: DC

## 2013-08-22 NOTE — Discharge Summary (Signed)
Physician Discharge Summary  Roger Walton:[id number] DOB: [birth year] DOA: 08/20/2013  PCP: Bobby Rumpf, MD  Admit date: 08/20/2013 Discharge date: 08/22/2013  Time spent: 40 minutes  Recommendations for Outpatient Follow-up:  1. Followup with primary care physician within one week. 2. Followup tomorrow 08/23/13 for  Discharge Diagnoses:  Principal Problem:   Tegretol toxicity Active Problems:   HIV DISEASE   SEIZURE DISORDER   Lung cancer, upper lobe   Altered mental status   Protein-calorie malnutrition, severe   Discharge Condition: Stable  Diet recommendation: Regular  Filed Weights   08/20/13 2100 08/21/13 0009  Weight: 63.504 kg (140 lb) 58.4 kg (128 lb 12 oz)    History of present illness:  Roger Walton is a 58 y.o. male who presents to the ED after seizure yesterday and today. Since 1600 today he has also had AMS. AMS consists of slurred speech and lethargy. Wife notes he has been out of his phenobarbital for several days.  Hospital Course:   Seizure  -The records patient was not taking phenobarbital and only taking Tegretol.  -Phenobarbital resumed, Tegretol held for now because of high levels.  -Patient has known seizure disorder. MRI without contrast showed no intracranial abnormalities. -No seizure-like activity while in the hospital.   Syncope -Patient had orthostatic hypotension, documented with blood pressure 105/69 lying went down to 89/69 upon standing. -IV fluids was also started but patient refused IV access. -Oral fluids intake encouraged, patient did very okay and dizziness resolved. -On the day of discharge denies any dizziness, patient was not orthostatic.  Tegretol toxicity  -Carbamazepine level is 15.8, check carbamazepine level again.  -Restart Tegretol if level within therapeutic range which is 4-12 mcg/mL. -Tegretol dose decreased from 3 tablets at evening to 2 tablets. -Patient was taken 3 tablets in a.m., 2 tablets at noon and 3  tablets at evening, switched to 3, 2 and 2 tablets.  HIV disease  -Seems to be controlled, patient follows with ID as outpatient, continue HAART.   Lung cancer  -Patient follows with Dr. Julien Nordmann as outpatient, received chemotherapy and radiation.  -MRI (without contrast) of the brain showed no metastasis   Procedures:  None  Consultations:  None  Discharge Exam: Filed Vitals:   08/22/13 0548  BP: 104/67  Pulse: 97  Temp: 98.9 F (37.2 C)  Resp: 18   General: Alert and awake, oriented x3, not in any acute distress. HEENT: anicteric sclera, pupils reactive to light and accommodation, EOMI CVS: S1-S2 clear, no murmur rubs or gallops Chest: clear to auscultation bilaterally, no wheezing, rales or rhonchi Abdomen: soft nontender, nondistended, normal bowel sounds, no organomegaly Extremities: no cyanosis, clubbing or edema noted bilaterally Neuro: Cranial nerves II-XII intact, no focal neurological deficits  Discharge Instructions You were cared for by a hospitalist during your hospital stay. If you have any questions about your discharge medications or the care you received while you were in the hospital after you are discharged, you can call the unit and asked to speak with the hospitalist on call if the hospitalist that took care of you is not available. Once you are discharged, your primary care physician will handle any further medical issues. Please note that NO REFILLS for any discharge medications will be authorized once you are discharged, as it is imperative that you return to your primary care physician (or establish a relationship with a primary care physician if you do not have one) for your aftercare needs so that they can reassess your  need for medications and monitor your lab values.  Discharge Orders   Future Appointments Provider Department Dept Phone   08/24/2013 12:30 PM [facility] [phone]   08/24/2013 1:30 PM  [facility] [phone]   Liquids only 4 hours prior to your exam. Any medications can be taken as usual. Please arrive 15 min prior to your scheduled exam time.   08/30/2013 8:30 AM Curt Bears, MD [facility] [phone]   11/01/2013 11:15 AM Campbell Riches, MD [facility] for Infectious Disease [phone]   Future Orders Complete By Expires   Increase activity slowly  As directed        Medication List         carbamazepine 200 MG tablet  Commonly known as:  TEGRETOL  take 3 tablets by mouth every morning 2 AT NOON and 2 tablets by mouth at bedtime     efavirenz-emtricitabine-tenofovir 600-200-300 MG per tablet  Commonly known as:  ATRIPLA  Take 1 tablet by mouth at bedtime.     gabapentin 300 MG capsule  Commonly known as:  NEURONTIN  Take 1 capsule (300 mg total) by mouth 3 (three) times daily.     megestrol 40 MG/ML suspension  Commonly known as:  MEGACE  take 10 milliliters (2 teaspoonfuls) by mouth once daily     oxyCODONE 5 MG immediate release tablet  Commonly known as:  Oxy IR/ROXICODONE  Take 1 tablet (5 mg total) by mouth every 4 (four) hours as needed for severe pain.     PHENobarbital 64.8 MG tablet  Commonly known as:  LUMINAL  Take 1 tablet (64.8 mg total) by mouth at bedtime.       No Known Allergies     Follow-up Information   Follow up with Bobby Rumpf, MD In 1 week.   Specialty:  Infectious Diseases   Contact information:   [address]  [address] [place] [address] [phone]        The results of significant diagnostics from this hospitalization (including imaging, microbiology, ancillary and laboratory) are listed below for reference.    Significant Diagnostic Studies: Ct Head Wo Contrast  08/20/2013   CLINICAL DATA:  Head pain.  EXAM: CT HEAD WITHOUT CONTRAST  TECHNIQUE: Contiguous axial images were obtained  from the base of the skull through the vertex without intravenous contrast.  COMPARISON:  CT HEAD W/O CM dated 07/09/2013  FINDINGS: Mild diffuse cerebral atrophy. No ventricular dilatation. Mild patchy low-attenuation change in the deep white matter consistent with small vessel ischemia. No mass effect or midline shift. No abnormal extra-axial fluid collections. Gray-white matter junctions are distinct. Basal cisterns are not effaced. No evidence of acute intracranial hemorrhage. No depressed skull fractures. Visualized paranasal sinuses and mastoid air cells are not opacified.  IMPRESSION: No acute intracranial abnormalities. Mild chronic atrophy and small vessel ischemic changes.   Electronically Signed   By: Lucienne Capers M.D.   On: 08/20/2013 22:15   Mr Brain Wo Contrast  08/21/2013   CLINICAL DATA:  Seizures at home. Altered mental status. Subtherapeutic phenobarbital level. HIV disease. Right Upper lobe lung cancer.  EXAM: MRI HEAD WITHOUT CONTRAST  TECHNIQUE: Multiplanar, multiecho pulse sequences of the brain and surrounding structures were obtained without intravenous contrast.  COMPARISON:  CT HEAD W/O CM dated 08/20/2013; MR NECK SOFT TISSUE ONLY WO/W CM dated 05/06/2013; DG CHEST  2 VIEW dated 05/16/2012; CT CHEST W/CM dated 06/24/2012; DG PNEUMONIA CHEST 2V dated 06/06/2006; CT HEAD W/O CM dated 07/09/2013  FINDINGS: Study was performed without contrast. Sensitivity in the detection of intracranial opportunistic infection or metastatic disease is increased with the administration of IV gadolinium and post infusion imaging.  No evidence for acute infarction, hemorrhage, mass lesion, hydrocephalus, or extra-axial fluid. Mild cerebral and cerebellar atrophy. Mild subcortical and periventricular T2 and FLAIR hyperintensities, likely chronic microvascular ischemic change. Pituitary, pineal, and cerebellar tonsils unremarkable. Flow voids are maintained throughout the carotid, basilar, and vertebral arteries.  There are no areas of chronic hemorrhage. Visualized calvarium, skull base, and upper cervical osseous structures unremarkable. Scalp and extracranial soft tissues, orbits, sinuses, and mastoids show no acute process. Similar appearance to prior examinations.  IMPRESSION: Chronic changes as described. No intracranial mass or inflammatory lesion is evident.   Electronically Signed   By: Rolla Flatten M.D.   On: 08/21/2013 21:35   Dg Chest Portable 1 View  08/20/2013   CLINICAL DATA:  Altered mental status. Seizures. History of smoking.  EXAM: PORTABLE CHEST - 1 VIEW  COMPARISON:  CT CHEST W/CM dated 04/05/2013; DG CHEST 2 VIEW dated 07/18/2012  FINDINGS: Normal heart size and pulmonary vascularity. Pulmonary hyperinflation and scattered fibrosis consistent with emphysematous changes. Soft tissue opacification in the right apex is stable since prior studies. Right lower lung pulmonary nodule seen on CT is not appreciated radiographically. No focal consolidation or airspace disease. No blunting of costophrenic angles. No pneumothorax. No significant change since previous study.  IMPRESSION: No active disease.  Unchanged chronic findings.   Electronically Signed   By: Lucienne Capers M.D.   On: 08/20/2013 22:08    Microbiology: Recent Results (from the past 240 hour(s))  MRSA PCR SCREENING     Status: None   Collection Time    08/21/13 12:26 AM      Result Value Ref Range Status   MRSA by PCR NEGATIVE  NEGATIVE Final   Comment:            The GeneXpert MRSA Assay (FDA     approved for NASAL specimens     only), is one component of a     comprehensive MRSA colonization     surveillance program. It is not     intended to diagnose MRSA     infection nor to guide or     monitor treatment for     MRSA infections.     Labs: Basic Metabolic Panel:  Recent Labs Lab 08/20/13 2147 08/22/13 0550  NA 140 139  K 4.2 4.1  CL 103 104  CO2 26 25  GLUCOSE 97 95  BUN 15 20  CREATININE 0.80 0.83   CALCIUM 9.4 9.0   Liver Function Tests:  Recent Labs Lab 08/20/13 2147  AST 20  ALT 13  ALKPHOS 84  BILITOT <0.2*  PROT 7.2  ALBUMIN 2.9*   No results found for this basename: LIPASE, AMYLASE,  in the last 168 hours No results found for this basename: AMMONIA,  in the last 168 hours CBC:  Recent Labs Lab 08/20/13 2147  WBC 4.1  NEUTROABS 2.9  HGB 11.9*  HCT 33.7*  MCV 98.0  PLT 249   Cardiac Enzymes: No results found for this basename: CKTOTAL, CKMB, CKMBINDEX, TROPONINI,  in the last 168 hours BNP: BNP (last 3 results) No results found for this basename: PROBNP,  in the last 8760 hours CBG:  Recent Labs Lab  08/20/13 2138  GLUCAP 101*       Signed:  Calyx Hawker  [facility] 08/22/2013, 11:05 AM

## 2013-08-22 NOTE — Progress Notes (Signed)
NURSING PROGRESS NOTE  Roger Walton [id number] Discharge Data: 08/22/2013 11:47 AM Attending Provider: Verlee Monte, MD PTW:[id number] [name], MD     [name] to be D/C'd Home per MD order.  Discussed with the patient the After Visit Summary and all questions fully answered. All IV's discontinued with no bleeding noted. All belongings returned to patient for patient to take home.   Last Vital Signs:  Blood pressure 104/67, pulse 97, temperature 98.9 F (37.2 C), temperature source Oral, resp. rate 18, height 5\' 9"  (1.753 m), weight 58.4 kg (128 lb 12 oz), SpO2 96.00%.  Discharge Medication List   Medication List         carbamazepine 200 MG tablet  Commonly known as:  TEGRETOL  take 3 tablets by mouth every morning 2 AT NOON and 2 tablets by mouth at bedtime     efavirenz-emtricitabine-tenofovir 600-200-300 MG per tablet  Commonly known as:  ATRIPLA  Take 1 tablet by mouth at bedtime.     gabapentin 300 MG capsule  Commonly known as:  NEURONTIN  Take 1 capsule (300 mg total) by mouth 3 (three) times daily.     megestrol 40 MG/ML suspension  Commonly known as:  MEGACE  take 10 milliliters (2 teaspoonfuls) by mouth once daily     oxyCODONE 5 MG immediate release tablet  Commonly known as:  Oxy IR/ROXICODONE  Take 1 tablet (5 mg total) by mouth every 4 (four) hours as needed for severe pain.     PHENobarbital 64.8 MG tablet  Commonly known as:  LUMINAL  Take 1 tablet (64.8 mg total) by mouth at bedtime.

## 2013-08-23 ENCOUNTER — Encounter (HOSPITAL_COMMUNITY): Payer: Self-pay | Admitting: *Deleted

## 2013-08-23 ENCOUNTER — Inpatient Hospital Stay (HOSPITAL_COMMUNITY)
Admission: RE | Admit: 2013-08-23 | Discharge: 2013-08-23 | Disposition: A | Discharge status: Home / Self Care | Payer: Medicare Other | Source: Ambulatory Visit

## 2013-08-23 MED ORDER — CEFAZOLIN SODIUM-DEXTROSE 2-3 GM-% IV SOLR
2.0000 g | INTRAVENOUS | Status: AC
  Administered 2013-08-24: 2 g via INTRAVENOUS
  Filled 2013-08-23: qty 50

## 2013-08-23 MED ORDER — CHLORHEXIDINE GLUCONATE 4 % EX LIQD
1.0000 "application " | Freq: Once | CUTANEOUS | Status: DC
  Filled 2013-08-23: qty 15

## 2013-08-23 NOTE — Pre-Procedure Instructions (Signed)
[facility] - Preparing for Surgery  Before surgery, you can play an important role.  Because skin is not sterile, your skin needs to be as free of germs as possible.  You can reduce the number of germs on you skin by washing with CHG (chlorahexidine gluconate) soap before surgery.  CHG is an antiseptic cleaner which kills germs and bonds with the skin to continue killing germs even after washing.  Please DO NOT use if you have an allergy to CHG or antibacterial soaps.  If your skin becomes reddened/irritated stop using the CHG and inform your nurse when you arrive at Short Stay.  Do not shave (including legs and underarms) for at least 48 hours prior to the first CHG shower.  You may shave your face.  Please follow these instructions carefully:   1.  Shower with CHG Soap the night before surgery and the morning of Surgery.  2.  If you choose to wash your hair, wash your hair first as usual with your normal shampoo.  3.  After you shampoo, rinse your hair and body thoroughly to remove the shampoo.  4.  Use CHG as you would any other liquid soap.  You can apply CHG directly to the skin and wash gently with scrungie or a clean washcloth.  5.  Apply the CHG Soap to your body ONLY FROM THE NECK DOWN.  Do not use on open wounds or open sores.  Avoid contact with your eyes, ears, mouth and genitals (private parts).  Wash genitals (private parts) with your normal soap.  6.  Wash thoroughly, paying special attention to the area where your surgery will be performed.  7.  Thoroughly rinse your body with warm water from the neck down.  8.  DO NOT shower/wash with your normal soap after using and rinsing off the CHG Soap.  9.  Pat yourself dry with a clean towel.            10.  Wear clean pajamas.            11.  Place clean sheets on your bed the night of your first shower and do not sleep with pets.  Day of Surgery  Do not apply any lotions the morning of surgery.  Please wear clean clothes to the  hospital/surgery center.   

## 2013-08-23 NOTE — Progress Notes (Signed)
Anesthesia Note:  Patient is a 58 year old male scheduled for wide excision of left buttock cancer tomorrow by Dr. Alphonsa Overall. He missed his 3 PM PAT visit today, so he is scheduled to be a same day work-up.  His history includes HIV, stage III [place] lung cancer diagnosed 06/2012 s/p chemoradiation, seizure disorder, protein calorie malnutrition.  Current BMI is recorded as 19.  HEM-ONC is Dr. Julien Nordmann.  ID is Dr. [name].  He was admitted to [facility] on 08/20/13 - 08/22/13 for seizure on 08/19/13 and 08/20/13 with AMS consisting of slurred speech and lethargy.  He had been out of phenobarbital for several days, so labs showed a sub-therapeutic phenobarbital level but a toxicTegretol level. Tegretol was held for a few days in phenobarbital was resumed. Tegretol level was normal at 5.5 on 08/21/13. MRI of the brain showed chronic changes but no intracranial mass or inflammatory lesions noted.  EKG on 08/20/13 showed SR, rightward axis, non-specific anterolateral T wave abnormalities, prolonged QT interval.  Baseline wanderer.   1V CXR on 08/20/13 showed: Normal heart size and pulmonary vascularity. Pulmonary hyperinflation and scattered fibrosis consistent with emphysematous  changes. Soft tissue opacification in the right apex is stable since prior studies. Right lower lung pulmonary nodule seen on CT is not appreciated radiographically. No focal consolidation or airspace disease. No blunting of costophrenic angles. No pneumothorax. No significant change since previous study. No active disease.  Labs from 08/20/13 and 08/21/13 noted.    Patient is back on his anti-seizure regimen.  Per PAT RN, he denied any recurrent seizures since discharge.  Further evaluation by his assigned anesthesiologist on the day of surgery. Reviewed with anesthesiologist Dr. Tobias Alexander who agrees that if no acute changes or recurrent seizures then it is anticipated that he can proceed as planned.  George Hugh [facility] Short Stay  Center/Anesthesiology Phone [phone] 08/23/2013 5:14 PM

## 2013-08-23 NOTE — Pre-Procedure Instructions (Signed)
Roger Walton  08/23/2013   Your procedure is scheduled on:  May 5  Report to [facility] Admitting at 07:00 AM.  Call this number if you have problems the morning of surgery: [phone]   Remember:   Do not eat food or drink liquids after midnight.   Take these medicines the morning of surgery with A SIP OF WATER: Tegretol, Oxycodone (if needed),    Do not wear jewelry, make-up or nail polish.  Do not wear lotions, powders, or perfumes. You may wear deodorant.  Do not shave 48 hours prior to surgery. Men may shave face and neck.  Do not bring valuables to the hospital.  [facility] is not responsible  for any belongings or valuables.               Contacts, dentures or bridgework may not be worn into surgery.  Leave suitcase in the car. After surgery it may be brought to your room.  For patients admitted to the hospital, discharge time is determined by your treatment team.               Patients discharged the day of surgery will not be allowed to drive home.  Name and phone number of your driver: Family/ Friend  Special Instructions: See [facility] Preparing For Surgery   Please read over the following fact sheets that you were given: Pain Booklet, Coughing and Deep Breathing and Surgical Site Infection Prevention

## 2013-08-23 NOTE — Progress Notes (Signed)
Spoke with Ebony Hail, [place] regarding patient. Ok with 1 view per Bernice, [place]

## 2013-08-24 ENCOUNTER — Ambulatory Visit (HOSPITAL_COMMUNITY): Payer: Medicare Other

## 2013-08-24 ENCOUNTER — Encounter (HOSPITAL_COMMUNITY)
Admission: RE | Disposition: A | Discharge status: Home / Self Care | Payer: Self-pay | Source: Ambulatory Visit | Attending: Surgery

## 2013-08-24 ENCOUNTER — Inpatient Hospital Stay (HOSPITAL_COMMUNITY)
Admission: RE | Admit: 2013-08-24 | Discharge: 2013-08-26 | DRG: 576 | Disposition: A | Discharge status: Home / Self Care | Payer: Medicare Other | Source: Ambulatory Visit | Attending: Surgery | Admitting: Surgery

## 2013-08-24 ENCOUNTER — Other Ambulatory Visit: Payer: Medicare Other

## 2013-08-24 ENCOUNTER — Encounter (HOSPITAL_COMMUNITY): Payer: Self-pay | Admitting: *Deleted

## 2013-08-24 ENCOUNTER — Encounter (HOSPITAL_COMMUNITY): Payer: Medicare Other | Admitting: Vascular Surgery

## 2013-08-24 ENCOUNTER — Ambulatory Visit (HOSPITAL_COMMUNITY): Payer: Medicare Other | Admitting: Vascular Surgery

## 2013-08-24 DIAGNOSIS — Z87891 Personal history of nicotine dependence: Secondary | ICD-10-CM

## 2013-08-24 DIAGNOSIS — C44721 Squamous cell carcinoma of skin of unspecified lower limb, including hip: Secondary | ICD-10-CM

## 2013-08-24 DIAGNOSIS — G40909 Epilepsy, unspecified, not intractable, without status epilepticus: Secondary | ICD-10-CM | POA: Diagnosis present

## 2013-08-24 DIAGNOSIS — C3430 Malignant neoplasm of lower lobe, unspecified bronchus or lung: Secondary | ICD-10-CM | POA: Diagnosis present

## 2013-08-24 DIAGNOSIS — B20 Human immunodeficiency virus [HIV] disease: Secondary | ICD-10-CM | POA: Diagnosis present

## 2013-08-24 DIAGNOSIS — C792 Secondary malignant neoplasm of skin: Principal | ICD-10-CM | POA: Diagnosis present

## 2013-08-24 DIAGNOSIS — E43 Unspecified severe protein-calorie malnutrition: Secondary | ICD-10-CM | POA: Diagnosis present

## 2013-08-24 DIAGNOSIS — F411 Generalized anxiety disorder: Secondary | ICD-10-CM | POA: Diagnosis present

## 2013-08-24 DIAGNOSIS — G8929 Other chronic pain: Secondary | ICD-10-CM | POA: Diagnosis present

## 2013-08-24 DIAGNOSIS — D0472 Carcinoma in situ of skin of left lower limb, including hip: Secondary | ICD-10-CM | POA: Diagnosis present

## 2013-08-24 DIAGNOSIS — Z79899 Other long term (current) drug therapy: Secondary | ICD-10-CM

## 2013-08-24 DIAGNOSIS — IMO0002 Reserved for concepts with insufficient information to code with codable children: Secondary | ICD-10-CM

## 2013-08-24 HISTORY — DX: Epilepsy, unspecified, not intractable, without status epilepticus: G40.909

## 2013-08-24 HISTORY — DX: Reserved for concepts with insufficient information to code with codable children: IMO0002

## 2013-08-24 HISTORY — DX: Unspecified protein-calorie malnutrition: E46

## 2013-08-24 HISTORY — DX: Other generalized epilepsy and epileptic syndromes, not intractable, without status epilepticus: G40.409

## 2013-08-24 HISTORY — PX: SKIN CANCER EXCISION: SHX779

## 2013-08-24 HISTORY — PX: MELANOMA EXCISION: SHX5266

## 2013-08-24 HISTORY — DX: Poisoning by iminostilbenes, accidental (unintentional), initial encounter: T42.1X1A

## 2013-08-24 SURGERY — EXCISION, MELANOMA
Anesthesia: General | Site: Buttocks | Laterality: Left

## 2013-08-24 MED ORDER — PHENOBARBITAL 32.4 MG PO TABS
64.8000 mg | ORAL_TABLET | Freq: Every day | ORAL | Status: DC
  Administered 2013-08-24 – 2013-08-25 (×2): 64.8 mg via ORAL
  Filled 2013-08-24: qty 1
  Filled 2013-08-24 (×2): qty 2

## 2013-08-24 MED ORDER — MIDAZOLAM HCL 2 MG/2ML IJ SOLN
INTRAMUSCULAR | Status: AC
  Filled 2013-08-24: qty 2

## 2013-08-24 MED ORDER — NEOSTIGMINE METHYLSULFATE 10 MG/10ML IV SOLN
INTRAVENOUS | Status: AC
  Filled 2013-08-24: qty 1

## 2013-08-24 MED ORDER — LIDOCAINE HCL (CARDIAC) 20 MG/ML IV SOLN
INTRAVENOUS | Status: DC | PRN
  Administered 2013-08-24: 80 mg via INTRAVENOUS

## 2013-08-24 MED ORDER — FENTANYL CITRATE 0.05 MG/ML IJ SOLN
INTRAMUSCULAR | Status: AC
  Filled 2013-08-24: qty 5

## 2013-08-24 MED ORDER — ONDANSETRON HCL 4 MG/2ML IJ SOLN: 4.0000 mg | Freq: Four times a day (QID) | INTRAMUSCULAR | Status: DC | PRN

## 2013-08-24 MED ORDER — CARBAMAZEPINE 200 MG PO TABS
200.0000 mg | ORAL_TABLET | Freq: Three times a day (TID) | ORAL | Status: DC
  Administered 2013-08-24 – 2013-08-26 (×6): 200 mg via ORAL
  Filled 2013-08-24 (×8): qty 1

## 2013-08-24 MED ORDER — EFAVIRENZ-EMTRICITAB-TENOFOVIR 600-200-300 MG PO TABS
1.0000 | ORAL_TABLET | Freq: Every day | ORAL | Status: DC
  Administered 2013-08-24 – 2013-08-25 (×2): 1 via ORAL
  Filled 2013-08-24 (×3): qty 1

## 2013-08-24 MED ORDER — GLYCOPYRROLATE 0.2 MG/ML IJ SOLN
INTRAMUSCULAR | Status: AC
  Filled 2013-08-24: qty 2

## 2013-08-24 MED ORDER — LACTATED RINGERS IV SOLN
INTRAVENOUS | Status: DC | PRN
  Administered 2013-08-24: 09:00:00 via INTRAVENOUS

## 2013-08-24 MED ORDER — OXYCODONE HCL 5 MG PO TABS: 5.0000 mg | ORAL_TABLET | Freq: Once | ORAL | Status: DC | PRN

## 2013-08-24 MED ORDER — MEGESTROL ACETATE 40 MG/ML PO SUSP
80.0000 mg | Freq: Every day | ORAL | Status: DC
  Administered 2013-08-25 – 2013-08-26 (×2): 80 mg via ORAL
  Filled 2013-08-24 (×2): qty 5

## 2013-08-24 MED ORDER — PROPOFOL 10 MG/ML IV BOLUS
INTRAVENOUS | Status: AC
  Filled 2013-08-24: qty 20

## 2013-08-24 MED ORDER — MORPHINE SULFATE 2 MG/ML IJ SOLN: 1.0000 mg | INTRAMUSCULAR | Status: DC | PRN

## 2013-08-24 MED ORDER — OXYCODONE-ACETAMINOPHEN 5-325 MG PO TABS
1.0000 | ORAL_TABLET | ORAL | Status: DC | PRN
  Administered 2013-08-24: 1 via ORAL
  Administered 2013-08-25 – 2013-08-26 (×3): 2 via ORAL
  Filled 2013-08-24 (×3): qty 2
  Filled 2013-08-24: qty 1

## 2013-08-24 MED ORDER — POTASSIUM CHLORIDE IN NACL 20-0.45 MEQ/L-% IV SOLN
INTRAVENOUS | Status: DC
  Administered 2013-08-24 – 2013-08-25 (×2): via INTRAVENOUS
  Filled 2013-08-24 (×3): qty 1000

## 2013-08-24 MED ORDER — PHENYLEPHRINE 40 MCG/ML (10ML) SYRINGE FOR IV PUSH (FOR BLOOD PRESSURE SUPPORT)
PREFILLED_SYRINGE | INTRAVENOUS | Status: AC
  Filled 2013-08-24: qty 10

## 2013-08-24 MED ORDER — ONDANSETRON HCL 4 MG/2ML IJ SOLN
INTRAMUSCULAR | Status: DC | PRN
  Administered 2013-08-24: 4 mg via INTRAVENOUS

## 2013-08-24 MED ORDER — HEPARIN SODIUM (PORCINE) 5000 UNIT/ML IJ SOLN
5000.0000 [IU] | Freq: Three times a day (TID) | INTRAMUSCULAR | Status: DC
  Administered 2013-08-24 – 2013-08-26 (×6): 5000 [IU] via SUBCUTANEOUS
  Filled 2013-08-24 (×8): qty 1

## 2013-08-24 MED ORDER — LACTATED RINGERS IV SOLN
INTRAVENOUS | Status: DC
  Administered 2013-08-24: 08:00:00 via INTRAVENOUS

## 2013-08-24 MED ORDER — 0.9 % SODIUM CHLORIDE (POUR BTL) OPTIME
TOPICAL | Status: DC | PRN
  Administered 2013-08-24: 1000 mL

## 2013-08-24 MED ORDER — HYDROMORPHONE HCL PF 1 MG/ML IJ SOLN
0.2500 mg | INTRAMUSCULAR | Status: DC | PRN
  Administered 2013-08-24: 0.5 mg via INTRAVENOUS

## 2013-08-24 MED ORDER — ONDANSETRON HCL 4 MG PO TABS: 4.0000 mg | ORAL_TABLET | Freq: Four times a day (QID) | ORAL | Status: DC | PRN

## 2013-08-24 MED ORDER — HYDROMORPHONE HCL PF 1 MG/ML IJ SOLN
INTRAMUSCULAR | Status: AC
  Filled 2013-08-24: qty 1

## 2013-08-24 MED ORDER — ONDANSETRON HCL 4 MG/2ML IJ SOLN
INTRAMUSCULAR | Status: AC
  Filled 2013-08-24: qty 2

## 2013-08-24 MED ORDER — MIDAZOLAM HCL 5 MG/5ML IJ SOLN
INTRAMUSCULAR | Status: DC | PRN
  Administered 2013-08-24: 1 mg via INTRAVENOUS

## 2013-08-24 MED ORDER — OXYCODONE HCL 5 MG/5ML PO SOLN: 5.0000 mg | Freq: Once | ORAL | Status: DC | PRN

## 2013-08-24 MED ORDER — FENTANYL CITRATE 0.05 MG/ML IJ SOLN
INTRAMUSCULAR | Status: DC | PRN
  Administered 2013-08-24: 50 ug via INTRAVENOUS
  Administered 2013-08-24: 100 ug via INTRAVENOUS
  Administered 2013-08-24: 50 ug via INTRAVENOUS

## 2013-08-24 MED ORDER — GABAPENTIN 300 MG PO CAPS
300.0000 mg | ORAL_CAPSULE | Freq: Three times a day (TID) | ORAL | Status: DC
  Administered 2013-08-24 – 2013-08-26 (×6): 300 mg via ORAL
  Filled 2013-08-24 (×8): qty 1

## 2013-08-24 MED ORDER — ROCURONIUM BROMIDE 100 MG/10ML IV SOLN
INTRAVENOUS | Status: DC | PRN
  Administered 2013-08-24: 40 mg via INTRAVENOUS

## 2013-08-24 MED ORDER — GLYCOPYRROLATE 0.2 MG/ML IJ SOLN
INTRAMUSCULAR | Status: DC | PRN
  Administered 2013-08-24: 0.4 mg via INTRAVENOUS

## 2013-08-24 MED ORDER — OXYCODONE HCL 5 MG PO TABS: 5.0000 mg | ORAL_TABLET | ORAL | Status: DC | PRN

## 2013-08-24 MED ORDER — PROPOFOL 10 MG/ML IV BOLUS
INTRAVENOUS | Status: DC | PRN
  Administered 2013-08-24: 150 mg via INTRAVENOUS

## 2013-08-24 MED ORDER — NEOSTIGMINE METHYLSULFATE 10 MG/10ML IV SOLN
INTRAVENOUS | Status: DC | PRN
  Administered 2013-08-24: 3 mg via INTRAVENOUS

## 2013-08-24 SURGICAL SUPPLY — 52 items
BANDAGE ELASTIC 6 VELCRO ST LF (GAUZE/BANDAGES/DRESSINGS) ×3 IMPLANT
BANDAGE GAUZE ELAST BULKY 4 IN (GAUZE/BANDAGES/DRESSINGS) ×3 IMPLANT
BENZOIN TINCTURE PRP APPL 2/3 (GAUZE/BANDAGES/DRESSINGS) IMPLANT
BLADE SURG ROTATE 9660 (MISCELLANEOUS) IMPLANT
BNDG GAUZE ELAST 4 BULKY (GAUZE/BANDAGES/DRESSINGS) ×3 IMPLANT
CANISTER SUCTION 2500CC (MISCELLANEOUS) ×3 IMPLANT
CHLORAPREP W/TINT 26ML (MISCELLANEOUS) ×3 IMPLANT
CLOSURE WOUND 1/2 X4 (GAUZE/BANDAGES/DRESSINGS)
COVER SURGICAL LIGHT HANDLE (MISCELLANEOUS) ×3 IMPLANT
DECANTER SPIKE VIAL GLASS SM (MISCELLANEOUS) ×3 IMPLANT
DRAPE LAPAROTOMY T 102X78X121 (DRAPES) ×3 IMPLANT
DRAPE ORTHO SPLIT 77X108 STRL (DRAPES)
DRAPE SURG ORHT 6 SPLT 77X108 (DRAPES) IMPLANT
DRAPE UTILITY 15X26 W/TAPE STR (DRAPE) ×6 IMPLANT
ELECT CAUTERY BLADE 6.4 (BLADE) ×3 IMPLANT
ELECT REM PT RETURN 9FT ADLT (ELECTROSURGICAL) ×3
ELECTRODE REM PT RTRN 9FT ADLT (ELECTROSURGICAL) ×1 IMPLANT
GLOVE SURG SIGNA 7.5 PF LTX (GLOVE) ×3 IMPLANT
GOWN STRL REUS W/ TWL LRG LVL3 (GOWN DISPOSABLE) ×1 IMPLANT
GOWN STRL REUS W/ TWL XL LVL3 (GOWN DISPOSABLE) ×1 IMPLANT
GOWN STRL REUS W/TWL LRG LVL3 (GOWN DISPOSABLE) ×2
GOWN STRL REUS W/TWL XL LVL3 (GOWN DISPOSABLE) ×2
KIT BASIN OR (CUSTOM PROCEDURE TRAY) ×3 IMPLANT
KIT ROOM TURNOVER OR (KITS) ×3 IMPLANT
NEEDLE 22X1 1/2 (OR ONLY) (NEEDLE) ×3 IMPLANT
NS IRRIG 1000ML POUR BTL (IV SOLUTION) ×3 IMPLANT
PACK SURGICAL SETUP 50X90 (CUSTOM PROCEDURE TRAY) ×3 IMPLANT
PAD ARMBOARD 7.5X6 YLW CONV (MISCELLANEOUS) ×3 IMPLANT
PENCIL BUTTON HOLSTER BLD 10FT (ELECTRODE) ×3 IMPLANT
SPONGE GAUZE 4X4 12PLY (GAUZE/BANDAGES/DRESSINGS) ×3 IMPLANT
SPONGE GAUZE 4X4 12PLY STER LF (GAUZE/BANDAGES/DRESSINGS) ×3 IMPLANT
SPONGE LAP 18X18 X RAY DECT (DISPOSABLE) ×3 IMPLANT
STAPLER VISISTAT 35W (STAPLE) IMPLANT
STOCKINETTE IMPERVIOUS LG (DRAPES) IMPLANT
STRIP CLOSURE SKIN 1/2X4 (GAUZE/BANDAGES/DRESSINGS) IMPLANT
SUT ETHILON 2 0 FS 18 (SUTURE) ×3 IMPLANT
SUT ETHILON 3 0 FSL (SUTURE) IMPLANT
SUT ETHILON 4 0 PS 2 18 (SUTURE) IMPLANT
SUT MON AB 4-0 PC3 18 (SUTURE) IMPLANT
SUT SILK 2 0 SH (SUTURE) ×3 IMPLANT
SUT VIC AB 2-0 SH 27 (SUTURE)
SUT VIC AB 2-0 SH 27X BRD (SUTURE) IMPLANT
SUT VIC AB 3-0 SH 27 (SUTURE)
SUT VIC AB 3-0 SH 27XBRD (SUTURE) IMPLANT
SYR BULB 3OZ (MISCELLANEOUS) ×3 IMPLANT
SYR CONTROL 10ML LL (SYRINGE) ×3 IMPLANT
TOWEL OR 17X24 6PK STRL BLUE (TOWEL DISPOSABLE) ×3 IMPLANT
TOWEL OR 17X26 10 PK STRL BLUE (TOWEL DISPOSABLE) ×3 IMPLANT
TUBE CONNECTING 12'X1/4 (SUCTIONS) ×1
TUBE CONNECTING 12X1/4 (SUCTIONS) ×2 IMPLANT
UNDERPAD 30X30 INCONTINENT (UNDERPADS AND DIAPERS) ×3 IMPLANT
YANKAUER SUCT BULB TIP NO VENT (SUCTIONS) ×3 IMPLANT

## 2013-08-24 NOTE — H&P (View-Only) (Signed)
Re:   Roger Walton DOB:   22-Nov-1955 MRN:   [id number]  ASSESSMENT AND PLAN: 1. 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  I had a lengthy discussion with the patient and his wife.  They have a little trouble understanding the underlying nature of this ulcer, that it will not heal unless we can get clean margins, and with Mr. Stanwood underlying medical issues, a wound may never heal.  Ideally wound be to excise the entire area back to clear margins.  Plan:  He is seeing Dr. [name] on Tuesday, 4/28.  I will review this with Dr. [name] and then schedule surgery after that, if everyone is agreeing.  I wrote him for oxycodone, 5 mg, #20.  (He says that he was out of meds)  [Reviewed the CT scan of left buttocks/leg (06/25/2013) with Dr. Suzy Bouchard.  The left thigh lesion was about 2.8 cm on 06/25/2013.  It sits on the gluteus maximus muscle.  There are no neurovascular bundles nearby.  The area is clearly larger now - about 5 cm.  DN 08/16/2013]  2. HIV   Followed by Dr. Page Spiro 3. 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  Last chemotherapy was last spring and summer.  I spoke to Dr. Earlie Server.  He says that he has some immune therapy he would like to try and it would be beneficial to have this wound healing prior to starting any treatment.  Followed by Dr. Antionette Poles. [place] 4. Seizure disorder -  Since childhood.  Mr. Hawes does not have a neurologist. 5.  Anxiety  Chief Complaint  Patient presents with  . New Evaluation    colon ca   REFERRING PHYSICIAN: Bobby Rumpf, MD  HISTORY OF PRESENT ILLNESS: Roger Walton is a 58 y.o. (DOB: [birth year])  AA  male whose primary care physician is  Bobby Rumpf, MD and comes to me today for left leg ulcer that on biopsy showed squamous cell ca. He is accompanied by his wife, Jeannene Patella.  He is having chronic pain in the ulcer.  He is trying to take care of it.  He thinks it looks bette than when I saw it in the hospital, but I think that it looks worse. He is on antibiotics, but I am not sure why.  He cannot tell me.  CT femur left - 06/25/2013 - Large subcutaneous soft tissue defect noted posteriorly to the level  of the fascia. No drainable fluid collection. CT abdomen - 06/25/2013 - Stable changes in the right adrenal gland.  Slight increase in size of a hypodense lesion within the spleen.  This previously showed no significant activity on PET-CT seen.  Stable appearing subcutaneous lesion in the posterior left buttock.  This may represent a sebaceous cyst. Lymphadenopathy within the left inguinal region. This may be   reactive in nature but may be related to the known ulcerative lesion within the thigh. . CT head - 07/09/2013 - Negative    Past Medical History  Diagnosis Date  . 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 HIV infection   . SOB (  shortness of breath)   . Chronic pain in right shoulder     scapula and arm for 2 months   . Anxiety     Past Surgical History  Procedure Laterality Date  . Fine needle aspiration       Current Outpatient Prescriptions  Medication Sig Dispense Refill  . carbamazepine (TEGRETOL) 200 MG tablet take 3 tablets by mouth every morning 2 AT NOON and 3 tablets by mouth at bedtime  720 tablet  2  . collagenase (SANTYL) ointment Apply topically daily.  15 g  0  . emtricitabine-tenofovir (TRUVADA) 200-300 MG per tablet Take 1 tablet by mouth daily.  30 tablet  11  . gabapentin (NEURONTIN) 300 MG capsule Take 1 capsule (300 mg total) by mouth 3 (three) times daily.  90 capsule  5  . megestrol (MEGACE) 40 MG/ML suspension take 10 milliliters  (2 teaspoonfuls) by mouth once daily  240 mL  2  . oxyCODONE (OXY IR/ROXICODONE) 5 MG immediate release tablet Take 1 tablet (5 mg total) by mouth every 4 (four) hours as needed for severe pain.  30 tablet  0  . PHENobarbital (LUMINAL) 64.8 MG tablet Take 1 tablet (64.8 mg total) by mouth at bedtime.  90 tablet  2  . raltegravir (ISENTRESS) 400 MG tablet Take 1 tablet (400 mg total) by mouth 2 (two) times daily.  120 tablet  3  . amoxicillin-clavulanate (AUGMENTIN) 875-125 MG per tablet Take 1 tablet by mouth every 12 (twelve) hours.  22 tablet  0  . doxycycline (VIBRA-TABS) 100 MG tablet Take 1 tablet (100 mg total) by mouth every 12 (twelve) hours.  22 tablet  0   No current facility-administered medications for this visit.     No Known Allergies  REVIEW OF SYSTEMS: Skin:  No history of rash.  No history of abnormal moles. Infection:  Has HIV.  Followed by Dr. Page Spiro. Neurologic:  History of seizures since childhood.  Reason unclear.  No neurologist. Cardiac:  No history of hypertension. No history of heart disease.  No history of prior cardiac catheterization.  No history of seeing a cardiologist. 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  Followed by Drs. Mohammed and Hunter.  Endocrine:  No diabetes. No thyroid disease. Gastrointestinal:  No history of stomach disease.  No history of liver disease.  No history of gall bladder disease.  No history of pancreas disease.  No history of colon disease. Urologic:  No history of kidney stones.  No history of bladder infections. Musculoskeletal:  No history of joint or back disease. Hematologic:  No bleeding disorder.  No history of anemia.  Not anticoagulated. Psycho-social:  The patient is oriented.     SOCIAL and FAMILY HISTORY: Wife, [name], with patient.  PHYSICAL EXAM: BP 130/72  Pulse 116   Temp(Src) 98 F (36.7 C)  Resp 18  Ht 5\' 9"  (1.753 m)  Wt 134 lb (60.782 kg)  BMI 19.78 kg/m2  General: Thin AA M who is alert. HEENT: Normal. Pupils equal. Neck: Supple. No mass.  No thyroid mass. Lymph Nodes:  No supraclavicular or cervical nodes. Lungs: Clear to auscultation. Heart:  RRR. No murmur or rub. Abdomen: Soft. No mass. No tenderness. No hernia. Normal bowel sounds.   Extremities:  Left buttocks, 2.5 cm "cyst".  This appears benign on CT.  Left posterior leg - 5 cm hard ulcer [photo  below].  Tender.   Neurologic:  Grossly intact to motor and sensory function. Psychiatric: Behavior is normal.    Left posterior thigh ulcer.  DATA REVIEWED: Epic notes.  Alphonsa Overall, MD,  [facility], [address]  [place], [place]    [place] Phone:  [phone] FAX:  [phone]

## 2013-08-24 NOTE — Anesthesia Preprocedure Evaluation (Addendum)
Anesthesia Evaluation  Patient identified by MRN, date of birth, ID band Patient awake    Reviewed: Allergy & Precautions, H&P , NPO status , Patient's Chart, lab work & pertinent test results  History of Anesthesia Complications (+) history of anesthetic complications  Airway Mallampati: II  Neck ROM: full    Dental  (+) Lower Dentures, Upper Dentures, Dental Advisory Given   Pulmonary shortness of breath, Current Smoker, former smoker,  Lung CA         Cardiovascular negative cardio ROS      Neuro/Psych Seizures -,  Anxiety    GI/Hepatic negative GI ROS, Neg liver ROS,   Endo/Other  negative endocrine ROS  Renal/GU negative Renal ROS     Musculoskeletal   Abdominal   Peds  Hematology  (+) HIV,   Anesthesia Other Findings   Reproductive/Obstetrics                        Anesthesia Physical Anesthesia Plan  ASA: III  Anesthesia Plan: General   Post-op Pain Management:    Induction: Intravenous  Airway Management Planned: Oral ETT  Additional Equipment:   Intra-op Plan:   Post-operative Plan: Extubation in OR  Informed Consent: I have reviewed the patients History and Physical, chart, labs and discussed the procedure including the risks, benefits and alternatives for the proposed anesthesia with the patient or authorized representative who has indicated his/her understanding and acceptance.     Plan Discussed with: CRNA, Anesthesiologist and Surgeon  Anesthesia Plan Comments:         Anesthesia Quick Evaluation

## 2013-08-24 NOTE — Anesthesia Postprocedure Evaluation (Signed)
Anesthesia Post Note  Patient: Roger Walton  Procedure(s) Performed: Procedure(s) (LRB): wide excision cancer of left buttock / leg  (Left)  Anesthesia type: General  Patient location: PACU  Post pain: Pain level controlled and Adequate analgesia  Post assessment: Post-op Vital signs reviewed, Patient's Cardiovascular Status Stable, Respiratory Function Stable, Patent Airway and Pain level controlled  Last Vitals:  Filed Vitals:   08/24/13 1115  BP: 136/91  Pulse: 101  Temp:   Resp: 18    Post vital signs: Reviewed and stable  Level of consciousness: awake, alert  and oriented  Complications: No apparent anesthesia complications

## 2013-08-24 NOTE — Transfer of Care (Signed)
Immediate Anesthesia Transfer of Care Note  Patient: Roger Walton  Procedure(s) Performed: Procedure(s): wide excision cancer of left buttock / leg  (Left)  Patient Location: PACU  Anesthesia Type:General  Level of Consciousness: awake, oriented, pateint uncooperative and confused  Airway & Oxygen Therapy: Patient Spontanous Breathing and Patient connected to nasal cannula oxygen  Post-op Assessment: Report given to PACU RN and Post -op Vital signs reviewed and stable  Post vital signs: Reviewed and stable  Complications: No apparent anesthesia complications

## 2013-08-24 NOTE — Interval H&P Note (Signed)
History and Physical Interval Note:  08/24/2013 8:58 AM  Roger Walton  has presented today for surgery, with the diagnosis of squamous cell carcinoma   The various methods of treatment have been discussed with the patient and family.  His wife is at the bedside.  After consideration of risks, benefits and other options for treatment, the patient has consented to  Procedure(s): wide excision cancer of left buttock / leg  (Left) as a surgical intervention .  The patient's history has been reviewed, patient examined, no change in status, stable for surgery.  I have reviewed the patient's chart and labs.  Questions were answered to the patient's satisfaction.     Shann Medal

## 2013-08-24 NOTE — Op Note (Signed)
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  ASSISTANT:   None  ANESTHESIA:   general  Anesthesiologist: Albertha Ghee, MD CRNA: Rogers Blocker, CRNA; Laretta Alstrom, CRNA  General  EBL:  <50  ml  BLOOD ADMINISTERED: none  DRAINS: none   LOCAL MEDICATIONS USED:   None  SPECIMEN:   Posterior thigh mass (long suture medial and short suture lateral)  COUNTS CORRECT:  YES  INDICATIONS FOR PROCEDURE:  Roger Walton is a 58 y.o. (DOB: 1955-11-05) AA  male whose primary care physician is Bobby Rumpf, MD and comes for wide excision of left posterior squamous cell carcinoma.   The indications and risks of the surgery were explained to the patient.  The risks include, but are not limited to, infection, bleeding, and nerve injury.  Procedure:    The patient was taken to room #1 and given a general anesthesia.  He was placed in a prone position.  His left posterior leg was prepped with betadine.  He was given 2 gm of Ancef.   A time out was held and the surgical checklist run.   He had 5-6 ulcerated lesion on the back of his left thigh.  I did a wide excision of this trying to get 1 cm around in all directions.  I go down to muscle fascia, but the mass did not grossly invade into the fascia.   I excised an area of tissue that left a defect of 10 x 6 cm.   Because of the chronicity of the wound, his overall medical condition, I did not close the wound.   I oriented the specimen with a long suture medial and a short suture cranial.   The specimen was looked at by Dr. Chelsea Primus.  On frozen section, the tumor went very near the deep margin, but not through it.  I thought that I had  a clean plane and did no further resection.   I packed the wound with saline damp gauze and sterilely dressed the wound.  Alphonsa Overall, MD, [facility] Pager: [phone] Office phone:  [phone]

## 2013-08-25 NOTE — Care Management Note (Signed)
  Page 1 of 1   08/26/2013     11:20:46 AM CARE MANAGEMENT NOTE 08/26/2013  Patient:  Roger Walton, Roger Walton   Account Number:  [id number]  Date Initiated:  08/25/2013  Documentation initiated by:  Magdalen Spatz  Subjective/Objective Assessment:     Action/Plan:   Anticipated DC Date:  08/26/2013   Anticipated DC Plan:  [place]         Choice offered to / List presented to:  C-1 Patient        [place] arranged  HH-1 RN      [place].   Status of service:  Completed, signed off Medicare Important Message given?   (If response is "NO", the following Medicare IM given date fields will be blank) Date Medicare IM given:   Date Additional Medicare IM given:    Discharge Disposition:    Per UR Regulation:    If discussed at Long Length of Stay Meetings, dates discussed:    Comments:  08-25-13 1446 Patient asleep at present. Will follow up later on which home health agency he would like. Magdalen Spatz RN BSN [phone]    08-25-13 Confirmed face sheet information with patient and wife . Wife is agreeable to be shown dressing change and assist with wound care at home. [place] List given to patient , will will decide later this afternoon on which agency he would like . Magdalen Spatz RN BSN [phone]

## 2013-08-25 NOTE — Progress Notes (Addendum)
INITIAL NUTRITION ASSESSMENT  DOCUMENTATION CODES Per approved criteria  -Severe malnutrition in the context of chronic illness   INTERVENTION: 1.  Modify diet; resume PO diet once medically appropriate per MD discretion. 2.  Supplements; consider Boost supplements once diet is resumed.   NUTRITION DIAGNOSIS: Increased nutrient needs related to wound healing as evidenced by estimated needs.   Goal: Intake to meet >90% of estimated nutrition needs.  Monitor:  weight trends, lab trends, I/O's, PO intake, supplement tolerance  Reason for Assessment: Malnutrition Screening Tool  58 y.o. 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  Pt was recently admitted for seizure activity due to tegretol toxicity at home.    Pt unavailable at time of visit- receiving nursing care. Pt recently assessed by RD for ongoing chronic malnutrition.   Cannot tolerate Ensure but will drink Boost.  RD to order for pt once diet resumed, and will continue to follow as needed for ongoing interventions.   Pt continues to meet criteria for severe MALNUTRITION in the context of chronic illness as evidenced by severe muscle mass loss, ongoing poor PO intake of <75% x at least 1 month, as well as continued wt loss- pt has lost an additional 2% in the past 2 months.  RD to continue to follow nutrition care plan. Height: Ht Readings from Last 1 Encounters:  08/24/13 5\' 9"  (1.753 m)    Weight: Wt Readings from Last 1 Encounters:  08/24/13 138 lb (62.596 kg)    Ideal Body Weight: 160 lb  % Ideal Body Weight: 80%  Wt Readings from Last 10 Encounters:  08/24/13 138 lb (62.596 kg)  08/24/13 138 lb (62.596 kg)  08/21/13 128 lb 12 oz (58.4 kg)  08/17/13 138 lb (62.596 kg)  08/13/13 134 lb (60.782 kg)  08/11/13 138 lb  (62.596 kg)  07/20/13 134 lb (60.782 kg)  06/24/13 134 lb 4.8 oz (60.918 kg)  06/24/13 133 lb (60.328 kg)  05/05/13 135 lb (61.236 kg)    Usual Body Weight: 140 lb  % Usual Body Weight: 91%  BMI:  Body mass index is 20.37 kg/(m^2). Normal weight  Estimated Nutritional Needs: Kcal: 1800 - 2000 Protein: at least 90 g daily Fluid: 1.8 - 2 liters  Skin: thigh wound  Diet Order: General  EDUCATION NEEDS: -No education needs identified at this time   Intake/Output Summary (Last 24 hours) at 08/25/13 1113 Last data filed at 08/25/13 0500  Gross per 24 hour  Intake   2340 ml  Output   2125 ml  Net    215 ml    Last BM: PTA  Labs:   Recent Labs Lab 08/20/13 2147 08/22/13 0550  NA 140 139  K 4.2 4.1  CL 103 104  CO2 26 25  BUN 15 20  CREATININE 0.80 0.83  CALCIUM 9.4 9.0  GLUCOSE 97 95    CBG (last 3)  No results found for this basename: GLUCAP,  in the last 72 hours  Scheduled Meds: . carbamazepine  200 mg Oral TID  . efavirenz-emtricitabine-tenofovir  1 tablet Oral QHS  . gabapentin  300 mg Oral TID  . heparin  5,000 Units Subcutaneous 3 times per day  . megestrol  80 mg Oral Daily  . PHENobarbital  64.8 mg Oral QHS    Continuous Infusions: . 0.45 % NaCl  with KCl 20 mEq / L 50 mL/hr at 08/24/13 1554  . lactated ringers 10 mL/hr at 08/24/13 7340    Past Medical History  Diagnosis Date  . HIV infection dx'd 2008  . SOB (shortness of breath)   . Chronic pain in right shoulder     scapula and arm for 2 months   . Anxiety   . Protein-calorie malnutrition   . Tegretol toxicity     history of  . 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 Grand mal seizure 03/04/12  . Epileptic seizures     "I've had them all my life" (08/24/2013)    Past Surgical History  Procedure Laterality Date  . Fine needle aspiration    . Skin cancer excision Left 08/24/2013    wide excision  cancer thigh/buttocks  . Wound debridement Left 06/24/2013    posterior thigh/notes 06/24/2013    Brynda Greathouse, MS RD LDN Clinical Inpatient Dietitian Pager: [phone] Weekend/After hours pager: [phone]

## 2013-08-25 NOTE — Progress Notes (Signed)
General Surgery Note  LOS: 1 day  POD -  1 Day Post-Op  Assessment/Plan: 1.  wide excision cancer of left buttock / leg   Dr. Donato Heinz called me and thinks this is a primary skin SCCa and that I got around it (with close margins)  2. HIV   Followed by Dr. Page Spiro  3. 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   Last chemotherapy was last spring and summer.   Followed by Dr. Antionette Poles. [place]  4. Seizure disorder - Since childhood.   Mr. Pargas does not have a neurologist.  5. Anxiety 6.  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  Wife in room.  To start dressing changes. Objective:   Filed Vitals:   08/25/13 0544  BP: 113/66  Pulse: 91  Temp: 99.1 F (37.3 C)  Resp: 18     Intake/Output from previous day:  05/05 0701 - 05/06 0700 In: 2340 [P.O.:240; I.V.:2100] Out: 2125 [Urine:2075; Blood:50]  Intake/Output this shift:      Physical Exam:   General: Thin older AA M who is alert and oriented.    HEENT: Normal. Pupils equal. .   Lungs: Clear   Wound: 8 x 10 cm wound that is clean.  Will start dressing changes.  With clear margins will hope to do a [facility].   Lab Results:   No results found for this basename: WBC, HGB, HCT, PLT,  in the last 72 hours  BMET  No results found for this basename: NA, K, CL, CO2, GLUCOSE, BUN, CREATININE, CALCIUM,  in the last 72 hours  PT/INR  No results found for this basename: LABPROT, INR,  in the last 72 hours  ABG  No results found for this basename: PHART, PCO2, PO2, HCO3,  in the last 72 hours   Studies/Results:  No results found.   Anti-infectives:   Anti-infectives   Start     Dose/Rate Route Frequency Ordered Stop   08/24/13 2200  efavirenz-emtricitabine-tenofovir (ATRIPLA) 600-200-300 MG per tablet  1 tablet     1 tablet Oral Daily at bedtime 08/24/13 1412     08/24/13 0600  ceFAZolin (ANCEF) IVPB 2 g/50 mL premix     2 g 100 mL/hr over 30 Minutes Intravenous On call to O.R. 08/23/13 1430 08/24/13 0911      Alphonsa Overall, MD, FACS Pager: [facility] Office: [phone] 08/25/2013

## 2013-08-26 MED ORDER — HYDROCODONE-ACETAMINOPHEN 5-325 MG PO TABS: 1.0000 | ORAL_TABLET | Freq: Four times a day (QID) | ORAL | Status: DC | PRN

## 2013-08-26 NOTE — Discharge Instructions (Signed)
[facility] - DISCHARGE INSTRUCTIONS TO PATIENT  Activity:  Keep walking and moving  Wound Care:   Change dressing twice a day.  Pack with saline gauze.  Get in the shower daily or twice a day with dressing changes.  Diet:  As tolerated  Follow up appointment:  Call Dr. Pollie Friar office [facility]) at [phone] for an appointment in next week.  Medications and dosages:  Resume your home medications.  You have a prescription for:  Vicodin  Call Dr. Lucia Gaskins or his office  [phone]) if you have:  Temperature greater than 100.4,  Persistent nausea and vomiting,  Severe uncontrolled pain,  Redness, tenderness, or signs of infection (pain, swelling, redness, odor or green/yellow discharge around the site),  Difficulty breathing, headache or visual disturbances,  Any other questions or concerns you may have after discharge.  In an emergency, call 911 or go to an Emergency Department at a nearby hospital.

## 2013-08-26 NOTE — Discharge Summary (Signed)
Physician Discharge Summary  Patient ID:  Roger Walton  MRN: [id number]  DOB/AGE: [birth year] 58 y.o.  Admit date: 08/24/2013 Discharge date: 08/26/2013  Discharge Diagnoses:  1. 5 cm wound, left posterior leg   Biopsy ([id number]) - 06/26/2013 Moderately to poorly differentiated squamous cell ca   Question metastatic vs primary carcinoma 2. HIV   Followed by Dr. Page Walton  3. 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   Followed by Dr. Antionette Walton. [place]  4. Seizure disorder - Since childhood.   Roger Walton does not have a neurologist.  5. 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  Discharged Condition: good  Hospital Course: Roger Walton is an 58 y.o. male whose primary care physician is Roger Rumpf, MD and who was admitted 08/24/2013 with a chief complaint of squamous cell ca of left posterior leg.   He was brought to the operating room on 08/24/2013 and underwent  wide excision cancer of left buttock / leg  .   The discharge instructions were reviewed with the patient.  Consults: None  Significant Diagnostic Studies: Results for orders placed during the hospital encounter of 08/20/13  MRSA PCR SCREENING      Result Value Ref Range   MRSA by PCR NEGATIVE  NEGATIVE  CBC WITH DIFFERENTIAL      Result Value Ref Range   WBC 4.1  4.0 - 10.5 K/uL   RBC 3.44 (*) 4.22 - 5.81 MIL/uL   Hemoglobin 11.9 (*) 13.0 - 17.0 g/dL   HCT 33.7 (*) 39.0 - 52.0 %   MCV 98.0  78.0 - 100.0 fL   MCH 34.6 (*) 26.0 - 34.0 pg   MCHC 35.3  30.0 - 36.0 g/dL   RDW 13.0  11.5 - 15.5 %   Platelets 249  150 - 400 K/uL   Neutrophils Relative % 70  43 - 77 %   Neutro Abs 2.9  1.7 - 7.7 K/uL   Lymphocytes Relative 18  12 - 46 %    Lymphs Abs 0.7  0.7 - 4.0 K/uL   Monocytes Relative 12  3 - 12 %   Monocytes Absolute 0.5  0.1 - 1.0 K/uL   Eosinophils Relative 0  0 - 5 %   Eosinophils Absolute 0.0  0.0 - 0.7 K/uL   Basophils Relative 0  0 - 1 %   Basophils Absolute 0.0  0.0 - 0.1 K/uL  COMPREHENSIVE METABOLIC PANEL      Result Value Ref Range   Sodium 140  137 - 147 mEq/L   Potassium 4.2  3.7 - 5.3 mEq/L   Chloride 103  96 - 112 mEq/L   CO2 26  19 - 32 mEq/L   Glucose, Bld 97  70 - 99 mg/dL   BUN 15  6 - 23 mg/dL   Creatinine, Ser 0.80  0.50 - 1.35 mg/dL   Calcium 9.4  8.4 - 10.5 mg/dL   Total Protein 7.2  6.0 - 8.3 g/dL   Albumin 2.9 (*) 3.5 - 5.2 g/dL   AST 20  0 - 37 U/L   ALT 13  0 - 53 U/L   Alkaline Phosphatase 84  39 - 117 U/L   Total Bilirubin <  0.2 (*) 0.3 - 1.2 mg/dL   GFR calc non Af Amer >90  >90 mL/min   GFR calc Af Amer >90  >90 mL/min  CARBAMAZEPINE LEVEL, TOTAL      Result Value Ref Range   Carbamazepine Lvl 15.8 (*) 4.0 - 12.0 ug/mL  PHENOBARBITAL LEVEL      Result Value Ref Range   Phenobarbital 4.0 (*) 15.0 - 40.0 ug/mL  CARBAMAZEPINE LEVEL, TOTAL      Result Value Ref Range   Carbamazepine Lvl 5.5  4.0 - 12.0 ug/mL  BASIC METABOLIC PANEL      Result Value Ref Range   Sodium 139  137 - 147 mEq/L   Potassium 4.1  3.7 - 5.3 mEq/L   Chloride 104  96 - 112 mEq/L   CO2 25  19 - 32 mEq/L   Glucose, Bld 95  70 - 99 mg/dL   BUN 20  6 - 23 mg/dL   Creatinine, Ser 0.83  0.50 - 1.35 mg/dL   Calcium 9.0  8.4 - 10.5 mg/dL   GFR calc non Af Amer >90  >90 mL/min   GFR calc Af Amer >90  >90 mL/min  CBG MONITORING, ED      Result Value Ref Range   Glucose-Capillary 101 (*) 70 - 99 mg/dL    Ct Head Wo Contrast  08/20/2013   CLINICAL DATA:  Head pain.  EXAM: CT HEAD WITHOUT CONTRAST  TECHNIQUE: Contiguous axial images were obtained from the base of the skull through the vertex without intravenous contrast.  COMPARISON:  CT HEAD W/O CM dated 07/09/2013  FINDINGS: Mild diffuse cerebral atrophy. No  ventricular dilatation. Mild patchy low-attenuation change in the deep white matter consistent with small vessel ischemia. No mass effect or midline shift. No abnormal extra-axial fluid collections. Gray-white matter junctions are distinct. Basal cisterns are not effaced. No evidence of acute intracranial hemorrhage. No depressed skull fractures. Visualized paranasal sinuses and mastoid air cells are not opacified.  IMPRESSION: No acute intracranial abnormalities. Mild chronic atrophy and small vessel ischemic changes.   Electronically Signed   By: Lucienne Capers M.D.   On: 08/20/2013 22:15   Mr Brain Wo Contrast  08/21/2013   CLINICAL DATA:  Seizures at home. Altered mental status. Subtherapeutic phenobarbital level. HIV disease. Right Upper lobe lung cancer.  EXAM: MRI HEAD WITHOUT CONTRAST  TECHNIQUE: Multiplanar, multiecho pulse sequences of the brain and surrounding structures were obtained without intravenous contrast.  COMPARISON:  CT HEAD W/O CM dated 08/20/2013; MR NECK SOFT TISSUE ONLY WO/W CM dated 05/06/2013; DG CHEST 2 VIEW dated 05/16/2012; CT CHEST W/CM dated 06/24/2012; DG PNEUMONIA CHEST 2V dated 06/06/2006; CT HEAD W/O CM dated 07/09/2013  FINDINGS: Study was performed without contrast. Sensitivity in the detection of intracranial opportunistic infection or metastatic disease is increased with the administration of IV gadolinium and post infusion imaging.  No evidence for acute infarction, hemorrhage, mass lesion, hydrocephalus, or extra-axial fluid. Mild cerebral and cerebellar atrophy. Mild subcortical and periventricular T2 and FLAIR hyperintensities, likely chronic microvascular ischemic change. Pituitary, pineal, and cerebellar tonsils unremarkable. Flow voids are maintained throughout the carotid, basilar, and vertebral arteries. There are no areas of chronic hemorrhage. Visualized calvarium, skull base, and upper cervical osseous structures unremarkable. Scalp and extracranial soft tissues,  orbits, sinuses, and mastoids show no acute process. Similar appearance to prior examinations.  IMPRESSION: Chronic changes as described. No intracranial mass or inflammatory lesion is evident.   Electronically Signed   By: Rolla Flatten  M.D.   On: 08/21/2013 21:35   Dg Chest Portable 1 View  08/20/2013   CLINICAL DATA:  Altered mental status. Seizures. History of smoking.  EXAM: PORTABLE CHEST - 1 VIEW  COMPARISON:  CT CHEST W/CM dated 04/05/2013; DG CHEST 2 VIEW dated 07/18/2012  FINDINGS: Normal heart size and pulmonary vascularity. Pulmonary hyperinflation and scattered fibrosis consistent with emphysematous changes. Soft tissue opacification in the right apex is stable since prior studies. Right lower lung pulmonary nodule seen on CT is not appreciated radiographically. No focal consolidation or airspace disease. No blunting of costophrenic angles. No pneumothorax. No significant change since previous study.  IMPRESSION: No active disease.  Unchanged chronic findings.   Electronically Signed   By: Lucienne Capers M.D.   On: 08/20/2013 22:08    Discharge Exam:  Filed Vitals:   08/26/13 0616  BP: 100/72  Pulse: 92  Temp: 97.9 F (36.6 C)  Resp: 16    General: Thin AA M who is alert.  Lungs: Clear to auscultation and symmetric breath sounds. Heart:  RRR. No murmur or rub. Extremities:  8 x 10 cm wound left posterior leg that is clean.   Discharge Medications:     Medication List         carbamazepine 200 MG tablet  Commonly known as:  TEGRETOL  take 3 tablets by mouth every morning 2 AT NOON and 2 tablets by mouth at bedtime     efavirenz-emtricitabine-tenofovir 600-200-300 MG per tablet  Commonly known as:  ATRIPLA  Take 1 tablet by mouth at bedtime.     gabapentin 300 MG capsule  Commonly known as:  NEURONTIN  Take 1 capsule (300 mg total) by mouth 3 (three) times daily.     HYDROcodone-acetaminophen 5-325 MG per tablet  Commonly known as:  NORCO/VICODIN  Take 1-2 tablets  by mouth every 6 (six) hours as needed.     megestrol 40 MG/ML suspension  Commonly known as:  MEGACE  take 10 milliliters (2 teaspoonfuls) by mouth once daily     neomycin-bacitracin-polymyxin ointment  Commonly known as:  NEOSPORIN  Apply 1 application topically daily as needed for wound care. apply to eye     oxyCODONE 5 MG immediate release tablet  Commonly known as:  Oxy IR/ROXICODONE  Take 1 tablet (5 mg total) by mouth every 4 (four) hours as needed for severe pain.     PHENobarbital 64.8 MG tablet  Commonly known as:  LUMINAL  Take 1 tablet (64.8 mg total) by mouth at bedtime.        Disposition: 01-Home or Self Care      Discharge Orders   Future Appointments Provider Department Dept Phone   08/30/2013 8:30 AM Curt Bears, [facility] [phone]   11/01/2013 11:15 AM Campbell Riches, MD [facility] Infectious Disease [phone]   Future Orders Complete By Expires   Diet - low sodium heart healthy  As directed    Increase activity slowly  As directed       Activity:  Keep walking and moving  Wound Care:   Change dressing twice a day.  Pack with saline gauze.  Get in the shower daily or twice a day with dressing changes.  Diet:  As tolerated  Follow up appointment:  Call Dr. Pollie Friar office [facility]) at [phone] for an appointment in next week.  Medications and dosages:  Resume your home medications.  You have a prescription for:  Vicodin  Signed: Alphonsa Overall, M.D., [facility] Office:  [phone]  08/26/2013, 8:10 AM

## 2013-08-27 ENCOUNTER — Other Ambulatory Visit: Payer: Self-pay | Admitting: Licensed Clinical Social Worker

## 2013-08-27 ENCOUNTER — Encounter (HOSPITAL_COMMUNITY): Payer: Self-pay | Admitting: Surgery

## 2013-08-27 DIAGNOSIS — B20 Human immunodeficiency virus [HIV] disease: Secondary | ICD-10-CM

## 2013-08-27 MED ORDER — MEGESTROL ACETATE 40 MG/ML PO SUSP: ORAL | Status: DC

## 2013-08-30 ENCOUNTER — Other Ambulatory Visit: Payer: Self-pay | Admitting: Medical Oncology

## 2013-08-30 ENCOUNTER — Ambulatory Visit: Payer: Medicare Other | Admitting: Internal Medicine

## 2013-08-30 NOTE — Progress Notes (Signed)
Called to r/s appointment ,Onc tx request sent.

## 2013-08-31 ENCOUNTER — Telehealth: Payer: Self-pay | Admitting: Licensed Clinical Social Worker

## 2013-08-31 ENCOUNTER — Ambulatory Visit (INDEPENDENT_AMBULATORY_CARE_PROVIDER_SITE_OTHER): Payer: Medicare Other | Admitting: Surgery

## 2013-08-31 VITALS — Temp 97.1°F

## 2013-08-31 DIAGNOSIS — D0472 Carcinoma in situ of skin of left lower limb, including hip: Secondary | ICD-10-CM

## 2013-08-31 DIAGNOSIS — D0470 Carcinoma in situ of skin of unspecified lower limb, including hip: Secondary | ICD-10-CM

## 2013-08-31 NOTE — Progress Notes (Addendum)
Re:   Roger Walton DOB:   [birth year] MRN:   [id number]  ASSESSMENT AND PLAN: 1. 6 x 14 cm clean open wound, left posterior leg - post excision  [photo at end of note]  Biopsy ([id number]) - 06/26/2013 Moderately to poorly differentiated squamous cell ca   Excised left posterior leg mass - 08/24/2013 - [facility] 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    I will see Roger Walton back in one week.  [Patient failed follow up on 09/09/2013.  According to Roger Walton wife, the sutures have all come out.  We had received no call from the patient or [facility].  We will try to see Roger Walton next week.  DN 09/09/2013]  2.  Left inguinal adenopathy - probable metastatic cancer.   3. HIV   Followed by Dr. Page Spiro 4. 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  Last chemotherapy was last spring and summer.  Followed by Dr. Antionette Poles. [place] 5. Seizure disorder -  Since childhood.  Roger Walton does not have a neurologist. 6.  Anxiety  Chief Complaint  Patient presents with  . Procedure   REFERRING PHYSICIAN: Bobby Rumpf, MD  HISTORY OF PRESENT ILLNESS: Roger Walton is a 58 y.o. (DOB: 11-30-1955)  AA  male whose primary care physician is Bobby Rumpf, MD and comes to me today for follow up of excision of squamous cell ca of the left posterior leg. Roger Walton is accompanied by Roger Walton wife, [name].  Roger Walton is mildly tender, but doing well with the dressing changes at home.  History of posterior leg ulcer: Roger Walton was seen by the [facility] at the beginning of March 2015 - this prompted a biopsy of a left posterior upper leg mass.  Biopsies showed squamous cells carcinoma. CT femur left - 06/25/2013 - Large subcutaneous soft tissue defect noted posteriorly to  the level  of the fascia. No drainable fluid collection. CT abdomen - 06/25/2013 - Stable changes in the right adrenal gland.  Slight increase in size of a hypodense lesion within the spleen.  This previously showed no significant activity on PET-CT seen.  Stable appearing subcutaneous lesion in the posterior left buttock.  This may represent a sebaceous cyst. Lymphadenopathy within the left inguinal region. This may be   reactive in nature but may be related to the known ulcerative lesion within the thigh. . CT head - 07/09/2013 - Negative    Past Medical History  Diagnosis Date  . HIV infection dx'd 2008  . SOB (shortness of breath)   . Chronic pain in right shoulder     scapula and arm for 2 months   . Anxiety   . Protein-calorie malnutrition   . Tegretol toxicity     history of  . 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 Grand mal seizure 03/04/12  . Epileptic seizures     "I've had them all my life" (08/24/2013)    Past Surgical History  Procedure Laterality Date  . Fine needle aspiration    . Skin cancer excision Left 08/24/2013    wide excision cancer thigh/buttocks  . Wound debridement Left  06/24/2013    posterior thigh/notes 06/24/2013  . Melanoma excision Left 08/24/2013    Procedure: wide excision cancer of left buttock / leg ;  Surgeon: Shann Medal, MD;  Location: [facility];  Service: General;  Laterality: Left;     Current Outpatient Prescriptions  Medication Sig Dispense Refill  . carbamazepine (TEGRETOL) 200 MG tablet take 3 tablets by mouth every morning 2 AT NOON and 2 tablets by mouth at bedtime  720 tablet  2  . efavirenz-emtricitabine-tenofovir (ATRIPLA) 600-200-300 MG per tablet Take 1 tablet by mouth at bedtime.  30 tablet  11  . gabapentin (NEURONTIN) 300 MG capsule Take 1 capsule (300 mg total) by mouth 3 (three) times daily.  90 capsule  5  . HYDROcodone-acetaminophen (NORCO/VICODIN)  5-325 MG per tablet Take 1-2 tablets by mouth every 6 (six) hours as needed.  30 tablet  0  . megestrol (MEGACE) 40 MG/ML suspension take 10 milliliters (2 teaspoonfuls) by mouth once daily  240 mL  2  . neomycin-bacitracin-polymyxin (NEOSPORIN) ointment Apply 1 application topically daily as needed for wound care. apply to eye      . oxyCODONE (OXY IR/ROXICODONE) 5 MG immediate release tablet Take 1 tablet (5 mg total) by mouth every 4 (four) hours as needed for severe pain.  30 tablet  0  . PHENobarbital (LUMINAL) 64.8 MG tablet Take 1 tablet (64.8 mg total) by mouth at bedtime.  90 tablet  2   No current facility-administered medications for this visit.     No Known Allergies  REVIEW OF SYSTEMS: Skin:  No history of rash.  No history of abnormal moles. Infection:  Has HIV.  Followed by Dr. Page Spiro. Neurologic:  History of seizures since childhood.  Reason unclear.  No neurologist. Cardiac:  No history of hypertension. No history of heart disease.  No history of prior cardiac catheterization.  No history of seeing a cardiologist. 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  Followed by Drs. Mohammed and [place].  Endocrine:  No diabetes. No thyroid disease. Gastrointestinal:  No history of stomach disease.  No history of liver disease.  No history of gall bladder disease.  No history of pancreas disease.  No history of colon disease. Urologic:  No history of kidney stones.  No history of bladder infections. Musculoskeletal:  No history of joint or back disease. Hematologic:  No bleeding disorder.  No history of anemia.  Not anticoagulated. Psycho-social:  The patient is oriented.     SOCIAL and FAMILY HISTORY: Wife, [name], with patient.  PHYSICAL EXAM: Temp(Src) 97.1 F (36.2 C)  General: Thin AA M who is alert. HEENT: Normal. Pupils equal. Neck:  Supple. No mass.  No thyroid mass. Lymph Nodes:  No supraclavicular or cervical nodes.  Roger Walton does have palpable left inguinal nodes.  Abdomen: Soft. No mass. No tenderness. No hernia. Normal bowel sounds.  Roger Walton has left groin adenopathy. Extremities:  Left buttocks, 14 x 6 cm open wound that looks clean.   Left posterior thigh wound.  I did a delayed primary closure of about 70% of the wound.  Procedure:  Wound cleaned with H2O2.  Painted with betadine.  Infiltrated with 20 cc of 1% xylocaine.  I tried to close the wound with interrupted nylon sutures.  I closed 8 cm of a 12 cm wound.  There is some rolling under the wound.  I showed  Roger Walton wife how to pack it.  DATA REVIEWED: Epic notes.  Alphonsa Overall, MD,  [facility], [address]  [place], [place]    [place] Phone:  [phone] FAX:  [phone]

## 2013-08-31 NOTE — Telephone Encounter (Signed)
Robin RN with [facility] wanted Dr. [name] to know that the [facility] and Atripla have a level 1 interaction it was flagged in their computer system. I told her I would call her back if there any changes to be made.

## 2013-09-01 NOTE — Telephone Encounter (Signed)
His atripla appears to be working fine (tegretol would decrease the efficacy from the interaction). His tegretol may need to be changed, he needs to see neuro to do this. Does he have neuro f/u?

## 2013-09-02 ENCOUNTER — Encounter: Payer: Self-pay | Admitting: *Deleted

## 2013-09-02 NOTE — Progress Notes (Signed)
Clinical Social Worker spoke with patient by phone regarding transportation services.  CSW encouraged patient's spouse to call [facility]. Patient's spouse expressed understanding.  [name], MSW, LCSW, OSW-C Clinical Social Worker [facility] [phone]

## 2013-09-03 ENCOUNTER — Telehealth: Payer: Self-pay | Admitting: Internal Medicine

## 2013-09-03 NOTE — Telephone Encounter (Signed)
s/w pt re appts for 6/4 and 6/8. pt didn't have anything to write w/and schedule for lb/ct and f/u was mailed w/instructions.

## 2013-09-09 ENCOUNTER — Encounter (INDEPENDENT_AMBULATORY_CARE_PROVIDER_SITE_OTHER): Payer: Medicare Other | Admitting: Surgery

## 2013-09-15 ENCOUNTER — Ambulatory Visit
Admission: RE | Admit: 2013-09-15 | Discharge: 2013-09-15 | Disposition: A | Discharge status: Home / Self Care | Payer: Medicare Other | Source: Ambulatory Visit | Attending: Radiation Oncology | Admitting: Radiation Oncology

## 2013-09-15 ENCOUNTER — Encounter: Payer: Self-pay | Admitting: Radiation Oncology

## 2013-09-15 VITALS — BP 104/72 | HR 63 | Temp 98.5°F | Resp 18 | Wt 126.0 lb

## 2013-09-15 DIAGNOSIS — C3410 Malignant neoplasm of upper lobe, unspecified bronchus or lung: Secondary | ICD-10-CM

## 2013-09-15 MED ORDER — OXYCODONE HCL ER 15 MG PO T12A: 15.0000 mg | EXTENDED_RELEASE_TABLET | Freq: Two times a day (BID) | ORAL | Status: DC

## 2013-09-15 NOTE — Progress Notes (Signed)
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  Preoperative chemoradiotherapy, 07/28/2012-09/02/2012 where the patient received pre-op 45 Gy in 25 fractions of 1.8 gray but remained unresectable and refused radiation boost  2.  Palliation of Pain and Preservation of Spinal Function  05/14/2013, 05/17/2013, 05/21/2013, 05/26/2013, 05/28/2013   a. The C7 spine-invading recurrent primary lung cancer was treated to 50 Gy in 5 fractions with SBRT   b. The solitary right lower lung oligometastasis was treated to 54 Gy in 3 fractions with SBRT  Interval Since Last Radiation:  3  months  Narrative:  The patient returns today for routine follow-up.  Denies recent nausea or vomiting but, reports he did vomit last week. Denies headache or dizziness. Reports he ambulates at home with the aid of a walker. Denies ringing in the ears or diplopia. Reports a productive cough with white to yellow sputum. Reports shortness of breath with mild exertion. Scheduled to follow up with Dr. Lucia Gaskins tomorrow to close of posterior left leg. Incision remain 70 % open. Associates the greatest pain with new nodule on his left groin that presented one week ago. Denies back pain. Reports pain associated with left posterior left incision. Patient very weak. Reports his right arm is often stiff and pops frequently. -12 lb weight loss noted since 08/24/2013                               ALLERGIES:  has No Known Allergies.  Meds: Current Outpatient Prescriptions  Medication Sig Dispense Refill  . carbamazepine (TEGRETOL) 200 MG tablet take 3 tablets by mouth every morning 2 AT NOON and 2 tablets by mouth at bedtime  720 tablet  2  .  efavirenz-emtricitabine-tenofovir (ATRIPLA) 600-200-300 MG per tablet Take 1 tablet by mouth at bedtime.  30 tablet  11  . megestrol (MEGACE) 40 MG/ML suspension take 10 milliliters (2 teaspoonfuls) by mouth once daily  240 mL  2  . oxyCODONE (OXY IR/ROXICODONE) 5 MG immediate release tablet Take 1 tablet (5 mg total) by mouth every 4 (four) hours as needed for severe pain.  30 tablet  0  . PHENobarbital (LUMINAL) 64.8 MG tablet Take 1 tablet (64.8 mg total) by mouth at bedtime.  90 tablet  2  . gabapentin (NEURONTIN) 300 MG capsule Take 1 capsule (300 mg total) by mouth 3 (three) times daily.  90 capsule  5  . HYDROcodone-acetaminophen (NORCO/VICODIN) 5-325 MG per tablet Take 1-2 tablets by mouth every 6 (six) hours as needed.  30 tablet  0  . neomycin-bacitracin-polymyxin (NEOSPORIN) ointment Apply 1 application topically daily as needed for wound care. apply to eye       No current facility-administered medications for this encounter.    Physical Findings: The patient is in no acute distress. Patient is alert and oriented.  weight is 126 lb (57.153 kg). His oral temperature is 98.5 F (36.9 C). His blood pressure is 104/72 and his pulse is 63. His respiration is 18 and oxygen saturation is 100%. . 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 The patient has exquisitely tender matted adenopathy in the left inguinal canal.    Lab Findings: Lab Results  Component Value Date   WBC 4.1 08/20/2013   HGB 11.9* 08/20/2013   HCT 33.7* 08/20/2013   MCV 98.0 08/20/2013   PLT 249 08/20/2013    @LASTCHEM @  Radiographic Findings: Ct Head Wo Contrast  08/20/2013   CLINICAL DATA:  Head pain.  EXAM: CT HEAD WITHOUT CONTRAST  TECHNIQUE: Contiguous axial images were obtained from the base of the skull through the vertex without intravenous contrast.  COMPARISON:  CT HEAD W/O CM dated 07/09/2013  FINDINGS: Mild diffuse cerebral atrophy.  No ventricular dilatation. Mild patchy low-attenuation change in the deep white matter consistent with small vessel ischemia. No mass effect or midline shift. No abnormal extra-axial fluid collections. Gray-white matter junctions are distinct. Basal cisterns are not effaced. No evidence of acute intracranial hemorrhage. No depressed skull fractures. Visualized paranasal sinuses and mastoid air cells are not opacified.  IMPRESSION: No acute intracranial abnormalities. Mild chronic atrophy and small vessel ischemic changes.   Electronically Signed   By: Lucienne Capers M.D.   On: 08/20/2013 22:15   Mr Brain Wo Contrast  08/21/2013   CLINICAL DATA:  Seizures at home. Altered mental status. Subtherapeutic phenobarbital level. HIV disease. Right Upper lobe lung cancer.  EXAM: MRI HEAD WITHOUT CONTRAST  TECHNIQUE: Multiplanar, multiecho pulse sequences of the brain and surrounding structures were obtained without intravenous contrast.  COMPARISON:  CT HEAD W/O CM dated 08/20/2013; MR NECK SOFT TISSUE ONLY WO/W CM dated 05/06/2013; DG CHEST 2 VIEW dated 05/16/2012; CT CHEST W/CM dated 06/24/2012; DG PNEUMONIA CHEST 2V dated 06/06/2006; CT HEAD W/O CM dated 07/09/2013  FINDINGS: Study was performed without contrast. Sensitivity in the detection of intracranial opportunistic infection or metastatic disease is increased with the administration of IV gadolinium and post infusion imaging.  No evidence for acute infarction, hemorrhage, mass lesion, hydrocephalus, or extra-axial fluid. Mild cerebral and cerebellar atrophy. Mild subcortical and periventricular T2 and FLAIR hyperintensities, likely chronic microvascular ischemic change. Pituitary, pineal, and cerebellar tonsils unremarkable. Flow voids are maintained throughout the carotid, basilar, and vertebral arteries. There are no areas of chronic hemorrhage. Visualized calvarium, skull base, and upper cervical osseous structures unremarkable. Scalp and extracranial soft tissues,  orbits, sinuses, and mastoids show no acute process. Similar appearance to prior examinations.  IMPRESSION: Chronic changes as described. No intracranial mass or inflammatory lesion is evident.   Electronically Signed   By: Rolla Flatten M.D.   On: 08/21/2013 21:35   Dg Chest Portable 1 View  08/20/2013   CLINICAL DATA:  Altered mental status. Seizures. History of smoking.  EXAM: PORTABLE CHEST - 1 VIEW  COMPARISON:  CT CHEST W/CM dated 04/05/2013; DG CHEST 2 VIEW dated 07/18/2012  FINDINGS: Normal heart size and pulmonary vascularity. Pulmonary hyperinflation and scattered fibrosis consistent with emphysematous changes. Soft tissue opacification in the right apex is stable since prior studies. Right lower lung pulmonary nodule seen on CT is not appreciated radiographically. No focal consolidation or airspace disease. No blunting of costophrenic angles. No pneumothorax. No significant change since previous study.  IMPRESSION: No active disease.  Unchanged chronic findings.   Electronically Signed   By: Lucienne Capers M.D.   On: 08/20/2013 22:08    Impression:  The patient is recovering from the effects of radiation.  His right shoulder pain has improved following palliative radiation. His left inguinal adenopathy may be are reactive versus metastatic involvement. Currently, this is  his primary complaint. If the adenopathy represents metastatic disease, patient may possibly benefit from palliative radiation to this area.   Plan:  The patient will see Dr. Lucia Gaskins tomorrow for surgical followup and Dr. Earlie Server on June 8 after restaging CT scans. At this point, we'll follow along his care an as-needed basis. However, as above I would certainly be willing to consider palliative radiation to the left inguinal region if the adenopathy cell terms of metastatic disease.   _____________________________________  Sheral Apley. Tammi Klippel, M.D.

## 2013-09-15 NOTE — Progress Notes (Signed)
Denies recent nausea or vomiting but, reports he did vomit last week. Denies headache or dizziness. Reports he ambulates at home with the aid of a walker. Denies ringing in the ears or diplopia. Reports a productive cough with white to yellow sputum. Reports shortness of breath with mild exertion. Scheduled to follow up with Dr. Lucia Gaskins tomorrow to close of posterior left leg. Incision remain 70 % open. Associates the greatest pain with new nodule on his left groin that presented one week ago. Denies back pain. Reports pain associated with left posterior left incision. Patient very weak. Reports his right arm is often stiff and pops frequently. -12 lb weight loss noted since 08/24/2013.

## 2013-09-16 ENCOUNTER — Encounter (INDEPENDENT_AMBULATORY_CARE_PROVIDER_SITE_OTHER): Payer: Self-pay | Admitting: Surgery

## 2013-09-16 ENCOUNTER — Ambulatory Visit (INDEPENDENT_AMBULATORY_CARE_PROVIDER_SITE_OTHER): Payer: Medicare Other | Admitting: Surgery

## 2013-09-16 VITALS — BP 92/66 | HR 92 | Temp 97.2°F | Resp 20

## 2013-09-16 DIAGNOSIS — D0472 Carcinoma in situ of skin of left lower limb, including hip: Secondary | ICD-10-CM

## 2013-09-16 DIAGNOSIS — D0470 Carcinoma in situ of skin of unspecified lower limb, including hip: Secondary | ICD-10-CM

## 2013-09-16 NOTE — Progress Notes (Signed)
Re:   Roger Walton DOB:   [birth year] MRN:   [id number]  ASSESSMENT AND PLAN: 1. 4x7 cm clean open wound, left posterior leg - post excision  [photo at end of note]  Biopsy ([id number]) - 06/26/2013 Moderately to poorly differentiated squamous cell ca   Excised left posterior leg mass - 08/24/2013 - 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  I have left the sutures in place  He will see me back in 3 weeks.   2.  Left inguinal adenopathy - probable metastatic cancer.  This is him main complaint and is starting to affect his ability to walk.  The patient said something about Dr. Tammi Klippel wanting a biopsy of the left groin, but I don't see anything in his note to that effect.  He gets updated CT scans and then sees Dr. Earlie Server.  I think that we need direction by Dr. Earlie Server of an overall plan for his treatment.   3. HIV   Followed by Dr. Page Spiro 4. 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  Last chemotherapy was last spring and summer.  Followed by Dr. Antionette Poles. [place] 5. Seizure disorder -  Since childhood.  Mr. Vo does not have a neurologist. 6.  Anxiety 7.  Weakness of right hand  Chief Complaint  Patient presents with  . Routine Post Op    f/u left thigh   REFERRING PHYSICIAN: Bobby Rumpf, MD  HISTORY OF PRESENT ILLNESS: Roger Walton is a 58 y.o. (DOB: [birth year])  AA  male whose primary care physician is Bobby Rumpf, MD and comes to me today for follow up of excision of squamous cell ca of the left posterior leg. He is accompanied by his wife, Jeannene Patella.  HIs wife is doing a good job of cleaning the left posterior leg wound.  It is clean. His main complaints are weakness in the right hand and pain in the left groin.   In the left groin he has adenopathy which has not been biopsied.  History of posterior leg ulcer: He was seen by the [facility] at the beginning of March 2015 - this prompted a biopsy of a left posterior upper leg mass.  Biopsies showed squamous cells carcinoma. CT femur left - 06/25/2013 - Large subcutaneous soft tissue defect noted posteriorly to the level  of the fascia. No drainable fluid collection. CT abdomen - 06/25/2013 - Stable changes in the right adrenal gland.  Slight increase in size of a hypodense lesion within the spleen.  This previously showed no significant activity on PET-CT seen.  Stable appearing subcutaneous lesion in the posterior left buttock.  This may represent a sebaceous cyst. Lymphadenopathy within the left inguinal region. This may be   reactive in nature but may be related to the known ulcerative lesion within the thigh. . CT head - 07/09/2013 - Negative    Past Medical History  Diagnosis Date  . HIV infection dx'd 2008  . SOB (shortness of breath)   . Chronic pain in right shoulder     scapula and arm for 2 months   . Anxiety   . Protein-calorie malnutrition   . Tegretol toxicity     history of  . Lung cancer 07/13/12    right apical mass =  nscca,favor squamous cell  . 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 Grand mal seizure 03/04/12  . Epileptic seizures     "I've had them all my life" (08/24/2013)    Past Surgical History  Procedure Laterality Date  . Fine needle aspiration    . Skin cancer excision Left 08/24/2013    wide excision cancer thigh/buttocks  . Wound debridement Left 06/24/2013    posterior thigh/notes 06/24/2013  . Melanoma excision Left 08/24/2013    Procedure: wide excision cancer of left buttock / leg ;  Surgeon: Shann Medal, MD;  Location: [facility];  Service: General;  Laterality: Left;     Current Outpatient Prescriptions  Medication Sig Dispense Refill  . carbamazepine (TEGRETOL) 200 MG tablet take 3 tablets by mouth every morning 2  AT NOON and 2 tablets by mouth at bedtime  720 tablet  2  . efavirenz-emtricitabine-tenofovir (ATRIPLA) 600-200-300 MG per tablet Take 1 tablet by mouth at bedtime.  30 tablet  11  . gabapentin (NEURONTIN) 300 MG capsule Take 1 capsule (300 mg total) by mouth 3 (three) times daily.  90 capsule  5  . HYDROcodone-acetaminophen (NORCO/VICODIN) 5-325 MG per tablet Take 1-2 tablets by mouth every 6 (six) hours as needed.  30 tablet  0  . megestrol (MEGACE) 40 MG/ML suspension take 10 milliliters (2 teaspoonfuls) by mouth once daily  240 mL  2  . neomycin-bacitracin-polymyxin (NEOSPORIN) ointment Apply 1 application topically daily as needed for wound care. apply to eye      . oxyCODONE (OXY IR/ROXICODONE) 5 MG immediate release tablet Take 1 tablet (5 mg total) by mouth every 4 (four) hours as needed for severe pain.  30 tablet  0  . OxyCODONE (OXYCONTIN) 15 mg T12A 12 hr tablet Take 1 tablet (15 mg total) by mouth every 12 (twelve) hours.  60 tablet  0  . PHENobarbital (LUMINAL) 64.8 MG tablet Take 1 tablet (64.8 mg total) by mouth at bedtime.  90 tablet  2   No current facility-administered medications for this visit.     No Known Allergies  REVIEW OF SYSTEMS: Skin:  No history of rash.  No history of abnormal moles. Infection:  Has HIV.  Followed by Dr. Page Spiro. Neurologic:  History of seizures since childhood.  Reason unclear.  No neurologist. Cardiac:  No history of hypertension. No history of heart disease.  No history of prior cardiac catheterization.  No history of seeing a cardiologist. 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  Followed by Drs. Mohammed and [place].  SOCIAL and FAMILY HISTORY: Wife, [name], with patient.  PHYSICAL EXAM: BP 92/66  Pulse 92  Temp(Src) 97.2 F (36.2 C) (Temporal)  Resp 20  General: Thin AA M who is alert. HEENT:  Normal. Pupils equal. Neck: Supple. No mass.  No thyroid mass. Lymph Nodes:  No supraclavicular or cervical nodes.  He does have palpable left inguinal nodes. Abdomen: Soft. No mass.  He has left groin adenopathy.  Approx 4 x 8 cm Extremities/Left posterior thigh wound:  Left buttocks, 4x7 cm open wound that looks clean.  He now complains of right hand weakness.   Left posterior thigh wound    DATA REVIEWED: Epic notes.  Alphonsa Overall, MD,  [facility], [address]  [place], [place]    [place] Phone:  [place]

## 2013-09-23 ENCOUNTER — Ambulatory Visit (HOSPITAL_COMMUNITY): Admission: RE | Admit: 2013-09-23 | Payer: Medicare Other | Source: Ambulatory Visit

## 2013-09-23 ENCOUNTER — Other Ambulatory Visit: Payer: Medicare Other

## 2013-09-23 ENCOUNTER — Ambulatory Visit (HOSPITAL_COMMUNITY): Payer: Medicare Other

## 2013-09-26 ENCOUNTER — Encounter (HOSPITAL_COMMUNITY): Payer: Self-pay | Admitting: Emergency Medicine

## 2013-09-26 ENCOUNTER — Emergency Department (HOSPITAL_COMMUNITY): Payer: Medicare Other

## 2013-09-26 ENCOUNTER — Emergency Department (HOSPITAL_COMMUNITY)
Admission: EM | Admit: 2013-09-26 | Discharge: 2013-09-27 | Disposition: A | Discharge status: Home / Self Care | Payer: Medicare Other | Attending: Emergency Medicine | Admitting: Emergency Medicine

## 2013-09-26 DIAGNOSIS — F411 Generalized anxiety disorder: Secondary | ICD-10-CM | POA: Insufficient documentation

## 2013-09-26 DIAGNOSIS — Z21 Asymptomatic human immunodeficiency virus [HIV] infection status: Secondary | ICD-10-CM | POA: Insufficient documentation

## 2013-09-26 DIAGNOSIS — G40909 Epilepsy, unspecified, not intractable, without status epilepticus: Secondary | ICD-10-CM | POA: Insufficient documentation

## 2013-09-26 DIAGNOSIS — C3410 Malignant neoplasm of upper lobe, unspecified bronchus or lung: Secondary | ICD-10-CM

## 2013-09-26 DIAGNOSIS — E46 Unspecified protein-calorie malnutrition: Secondary | ICD-10-CM | POA: Insufficient documentation

## 2013-09-26 DIAGNOSIS — B20 Human immunodeficiency virus [HIV] disease: Secondary | ICD-10-CM

## 2013-09-26 DIAGNOSIS — M25519 Pain in unspecified shoulder: Secondary | ICD-10-CM | POA: Insufficient documentation

## 2013-09-26 DIAGNOSIS — G8929 Other chronic pain: Secondary | ICD-10-CM | POA: Insufficient documentation

## 2013-09-26 DIAGNOSIS — C3490 Malignant neoplasm of unspecified part of unspecified bronchus or lung: Secondary | ICD-10-CM | POA: Insufficient documentation

## 2013-09-26 DIAGNOSIS — R0602 Shortness of breath: Secondary | ICD-10-CM | POA: Insufficient documentation

## 2013-09-26 DIAGNOSIS — Z79899 Other long term (current) drug therapy: Secondary | ICD-10-CM | POA: Insufficient documentation

## 2013-09-26 LAB — URINALYSIS, ROUTINE W REFLEX MICROSCOPIC
Glucose, UA: NEGATIVE mg/dL
Hgb urine dipstick: NEGATIVE
Ketones, ur: NEGATIVE mg/dL
NITRITE: NEGATIVE
PROTEIN: 30 mg/dL — AB
SPECIFIC GRAVITY, URINE: 1.029 (ref 1.005–1.030)
UROBILINOGEN UA: 1 mg/dL (ref 0.0–1.0)
pH: 6 (ref 5.0–8.0)

## 2013-09-26 LAB — BASIC METABOLIC PANEL
BUN: 18 mg/dL (ref 6–23)
CO2: 24 mEq/L (ref 19–32)
Calcium: 11.8 mg/dL — ABNORMAL HIGH (ref 8.4–10.5)
Chloride: 107 mEq/L (ref 96–112)
Creatinine, Ser: 0.82 mg/dL (ref 0.50–1.35)
Glucose, Bld: 109 mg/dL — ABNORMAL HIGH (ref 70–99)
POTASSIUM: 3.6 meq/L — AB (ref 3.7–5.3)
Sodium: 143 mEq/L (ref 137–147)

## 2013-09-26 LAB — CBC WITH DIFFERENTIAL/PLATELET
BASOS PCT: 0 % (ref 0–1)
Basophils Absolute: 0 10*3/uL (ref 0.0–0.1)
EOS ABS: 0.1 10*3/uL (ref 0.0–0.7)
Eosinophils Relative: 1 % (ref 0–5)
HCT: 38.5 % — ABNORMAL LOW (ref 39.0–52.0)
HEMOGLOBIN: 13.3 g/dL (ref 13.0–17.0)
LYMPHS ABS: 0.8 10*3/uL (ref 0.7–4.0)
Lymphocytes Relative: 14 % (ref 12–46)
MCH: 33.9 pg (ref 26.0–34.0)
MCHC: 34.5 g/dL (ref 30.0–36.0)
MCV: 98.2 fL (ref 78.0–100.0)
MONOS PCT: 10 % (ref 3–12)
Monocytes Absolute: 0.5 10*3/uL (ref 0.1–1.0)
NEUTROS ABS: 3.9 10*3/uL (ref 1.7–7.7)
NEUTROS PCT: 75 % (ref 43–77)
Platelets: 213 10*3/uL (ref 150–400)
RBC: 3.92 MIL/uL — AB (ref 4.22–5.81)
RDW: 12.8 % (ref 11.5–15.5)
WBC: 5.3 10*3/uL (ref 4.0–10.5)

## 2013-09-26 LAB — URINE MICROSCOPIC-ADD ON

## 2013-09-26 MED ORDER — OXYCODONE HCL ER 15 MG PO T12A
15.0000 mg | EXTENDED_RELEASE_TABLET | Freq: Two times a day (BID) | ORAL | Status: DC
  Administered 2013-09-26: 15 mg via ORAL
  Filled 2013-09-26: qty 1

## 2013-09-26 MED ORDER — OXYCODONE HCL 5 MG PO TABS: 5.0000 mg | ORAL_TABLET | ORAL | Status: DC | PRN

## 2013-09-26 MED ORDER — HYDROMORPHONE HCL PF 1 MG/ML IJ SOLN
1.0000 mg | INTRAMUSCULAR | Status: AC | PRN
  Administered 2013-09-26 (×2): 1 mg via INTRAVENOUS
  Filled 2013-09-26 (×2): qty 1

## 2013-09-26 NOTE — ED Notes (Signed)
Pt urinated in urine and someone emptied it not knowing he had a urine ordered. Pt know's we need another sample and will let us know when he is able to urinate again.

## 2013-09-26 NOTE — Discharge Instructions (Signed)
Hypercalcemia Hypercalcemia means the calcium in your blood is too high. Calcium in our blood is important for the control of many things, such as:  Blood clotting.  Conducting of nerve impulses.  Muscle contraction.  Maintaining teeth and bone health.  Other body functions. In the bloodstream, calcium maintains a constant balance with another mineral, phosphate. Calcium is absorbed into the body through the small intestine. This is helped by Vitamin D. Calcium levels are maintained mostly by vitamin D and a hormone (parathyroid hormone). But the kidneys also help. Hypercalcemia can happen when the concentration of calcium is too high for the kidneys to maintain balance. The body maintains a balance between the calcium we eat and the calcium already in our body. If calcium intake is increased or we cannot use calcium properly, there may be problems. Some common sources of calcium are:   Dairy products.  Nuts.  Eggs.  Whole grains.  Legumes.  Green leafy vegetables. CAUSES There are many causes of this condition, but some common ones are:  Hyperparathyroidism. This is an over activity of the parathyroid gland.  Cancers of the breast, kidney, lung, head and neck are common causes of calcium increases.  Medications that cause you to urinate more often (diuretics), nausea, vomiting and diarrhea also increase the calcium in the blood.  Overuse of calcium-containing antacids. SYMPTOMS  Many patients with mild hypercalcemia have no symptoms. For those with symptoms common problems include:  Loss of appetite.  Constipation.  Increased thirst.  Heart rhythm changes.  Abnormal thinking.  Nausea.  Abdominal pain.  Kidney stones.  Mood swings.  Coma and death when severe.  Vomiting.  Increased urination.  High blood pressure.  Confusion. DIAGNOSIS   Your caregiver will do a medical history and perform a physical exam on you.  Calcium and parathyroid hormone  (PTH) may be measured with a blood test. TREATMENT   The treatment depends on the calcium level and what is causing the higher level. Hypercalcemia can be lifethreatening. Fast lowering of the calcium level may be necessary.  With normal kidney function, fluids can be given by vein to clear the excess calcium. Hemodialysis works well to reduce dangerous calcium levels if there is poor kidney function. This is a procedure in which a machine is used to filter out unwanted substances. The blood is then returned to the body.  Drugs, such as diuretics, can be given after adequate fluid intake is established. These medications help the kidneys get rid of extra calcium. Drugs that lessen (inhibit) bone loss are helpful in gaining long-term control. Phosphate pills help lower high calcium levels caused by a low supply of phosphate. Anti-inflammatory agents such as steroids are helpful with some cancers and toxic levels of vitamin D.  Treatment of the underlying cause of the hypercalcemia will also correct the imbalance. Hyperparathyroidism is usually treated by surgical removal of one or more of the parathyroid glands and any tissue, other than the glands themselves, that is producing too much hormone.  The hypercalcemia caused by cancer is difficult to treat without controlling the cancer. Symptoms can be improved with fluids and drug therapy as outlined above. PROGNOSIS   Surgery to remove the parathyroid glands is usually successful. This also depends on the amount of damage to the kidneys and whether or not it can be treated.  Mild hypercalcemia can be controlled with good fluid intake and the use of effective medications.  Hypercalcemia often develops as a late complication of cancer. The expected outlook   is poor without effective anticancer therapy. PREVENTION   If you are at risk for developing hypercalcemia, be familiar with early symptoms. Report these to your caregiver.  Good fluid intake  (up to four quarts of liquid a day if possible) is helpful.  Try to control nausea and vomiting, and treat fevers to avoid dehydration.  Lowering the amount of calcium in your diet is not necessary. High blood calcium reduces absorption of calcium in the intestine.  Stay as active as possible. SEEK IMMEDIATE MEDICAL CARE IF:   You develop chest pain, sweating, or shortness of breath.  You get confused, feel faint or pass out.  You develop severe nausea and vomiting. MAKE SURE YOU:   Understand these instructions.  Will watch your condition.  Will get help right away if you are not doing well or get worse. Document Released: 06/22/2004 Document Revised: 08/03/2012 Document Reviewed: 04/03/2010 Exit[employer] Patient Information 2014 [employer].  

## 2013-09-26 NOTE — ED Provider Notes (Signed)
CSN: [id number]     Arrival date & time 09/26/13  1729 History   First MD Initiated Contact with Patient 09/26/13 1749     Chief Complaint  Patient presents with  . Lung Cancer  . Skin Cancer    HPI Pt has been having pain in his legs, right hand and back.  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 . It started two weeks ago. The pain has been increasing.  It is hard to get around now with his walker.   He fell a couple of days ago. He is having pain in the sacral region now. Is also noticed some swelling and tenderness in his left groin region. He denies any penile discharge or dysuria Pt had been on oxycontin but he ran out of his medications.   The pain seemed to start after that.  He was on the pain medications for his hand pain.  No fevers.  He has been coughing.  No vomiting or diarrhea.  No trouble urinating.  No fevers. Past Medical History  Diagnosis Date  . HIV infection dx'd 2008  . SOB (shortness of breath)   . Chronic pain in right shoulder     scapula and arm for 2 months   . Anxiety   . Protein-calorie malnutrition   . Tegretol toxicity     history of  . 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 Grand mal seizure 03/04/12  . Epileptic seizures     "I've had them all my life" (08/24/2013)   Past Surgical History  Procedure Laterality Date  . Fine needle aspiration    . Skin cancer excision Left 08/24/2013    wide excision cancer thigh/buttocks  . Wound debridement Left 06/24/2013    posterior thigh/notes 06/24/2013  . Melanoma excision Left 08/24/2013    Procedure: wide excision cancer of left buttock / leg ;  Surgeon: Shann Medal, MD;  Location: [facility] OR;  Service: General;  Laterality: Left;   Family History  Problem Relation Age of Onset   . Colon cancer Neg Hx    History  Substance Use Topics  . Smoking status: Former Smoker -- 1.00 packs/day for 43 years    Types: Cigarettes  . Smokeless tobacco: Never Used     Comment: 08/24/2013 "quit smoking cigarettes ~ 2 months ago"  . Alcohol Use: 3.6 oz/week    6 Cans of beer per week    Review of Systems  All other systems reviewed and are negative.     Allergies  Review of patient's allergies indicates no known allergies.  Home Medications   Prior to Admission medications   Medication Sig Start Date End Date Taking? Authorizing Provider  carbamazepine (TEGRETOL) 200 MG tablet take 3 tablets by mouth every morning 2 AT NOON and 2 tablets by mouth at bedtime 08/22/13  Yes Verlee Monte, MD  efavirenz-emtricitabine-tenofovir (ATRIPLA) 600-200-300 MG per tablet Take 1 tablet by mouth at bedtime. 08/17/13  Yes Campbell Riches, MD  gabapentin (NEURONTIN) 300 MG capsule Take 1 capsule (300 mg total) by mouth 3 (three) times daily. 04/28/13  Yes Lora Paula, MD  megestrol (MEGACE) 40 MG/ML suspension take 10 milliliters (2 teaspoonfuls) by mouth once daily 08/27/13  Yes Campbell Riches,  MD  OxyCODONE (OXYCONTIN) 15 mg T12A 12 hr tablet Take 1 tablet (15 mg total) by mouth every 12 (twelve) hours. 09/15/13  Yes Lora Paula, MD  PHENobarbital (LUMINAL) 64.8 MG tablet Take 1 tablet (64.8 mg total) by mouth at bedtime. 07/20/13  Yes Campbell Riches, MD  oxyCODONE (OXY IR/ROXICODONE) 5 MG immediate release tablet Take 1 tablet (5 mg total) by mouth every 4 (four) hours as needed for severe pain. 09/26/13   Dorie Rank, MD   BP 117/62  Pulse 84  Temp(Src) 99.3 F (37.4 C) (Oral)  Resp 18  SpO2 90% Physical Exam  Nursing note and vitals reviewed. Constitutional: No distress.  Frail  HENT:  Head: Normocephalic and atraumatic.  Right Ear: External ear normal.  Left Ear: External ear normal.  Mouth/Throat: No oropharyngeal exudate.  Eyes: Conjunctivae are normal. Right eye  exhibits no discharge. Left eye exhibits no discharge. No scleral icterus.  Neck: Neck supple. No tracheal deviation present.  Cardiovascular: Normal rate, regular rhythm and intact distal pulses.   Pulmonary/Chest: Effort normal and breath sounds normal. No stridor. No respiratory distress. He has no wheezes. He has no rales.  Abdominal: Soft. Bowel sounds are normal. He exhibits no distension. There is no tenderness. There is no rebound and no guarding.  Genitourinary:  No penile discharge, normal testicles, lymphadenopathy with tenderness in the left inguinal region  Musculoskeletal: Normal range of motion. He exhibits no edema.       Right hip: Normal.       Cervical back: Normal.       Thoracic back: Normal.       Lumbar back: He exhibits tenderness and bony tenderness. He exhibits no swelling and no edema.  Neurological: He is alert. He has normal strength. No cranial nerve deficit (no facial droop, extraocular movements intact, no slurred speech) or sensory deficit. He exhibits normal muscle tone. He displays no seizure activity. Coordination normal.  Skin: Skin is warm and dry. No rash noted.  Open left posterior thigh, healthy pink tissue at the base of the wound, skin margins are without drainage or discharge (prior notes for the [facility] [place] [facility] were reviewed including the pictures taken of the wound)  Psychiatric: He has a normal mood and affect.    ED Course  Procedures (including critical care time) Labs Review Labs Reviewed  CBC WITH DIFFERENTIAL - Abnormal; Notable for the following:    RBC 3.92 (*)    HCT 38.5 (*)    All other components within normal limits  BASIC METABOLIC PANEL - Abnormal; Notable for the following:    Potassium 3.6 (*)    Glucose, Bld 109 (*)    Calcium 11.8 (*)    All other components within normal limits  URINALYSIS, ROUTINE W REFLEX MICROSCOPIC - Abnormal; Notable for the following:    Color, Urine ORANGE (*)    Bilirubin  Urine SMALL (*)    Protein, ur 30 (*)    Leukocytes, UA TRACE (*)    All other components within normal limits  URINE MICROSCOPIC-ADD ON - Abnormal; Notable for the following:    Casts HYALINE CASTS (*)    All other components within normal limits  RPR    Imaging Review Dg Chest 2 View  09/26/2013   CLINICAL DATA:  Productive cough  EXAM: CHEST  2 VIEW  COMPARISON:  Aug 20, 2013 chest radiograph and chest CT April 05, 2013  FINDINGS: There is underlying emphysematous change. No edema or  consolidation. The previously noted mass in the right lower lobe seen on prior CT is not seen on radiographic examination. The currently there is no edema or consolidation. There is some fibrotic change in the lung bases, however. Heart size and pulmonary vascularity are normal. No adenopathy. No bone lesions are appreciable.  IMPRESSION: Underlying emphysematous change. No edema or consolidation. The previously noted mass in the right lower lobe is not seen on the radiographic examination. If evaluation of this mass specifically is felt to be indicated at this time, chest CT would be the imaging study of choice for further assessment.   Electronically Signed   By: Lowella Grip M.D.   On: 09/26/2013 19:21   Dg Sacrum/coccyx  09/26/2013   CLINICAL DATA:  pain  EXAM: SACRUM AND COCCYX - 2+ VIEW  COMPARISON:  None.  FINDINGS: Frontal and lateral views were obtained. There is no fracture or diastases. No blastic or lytic bone lesion. No fracture or dislocation. Joint spaces appear intact.  IMPRESSION: No abnormality noted.   Electronically Signed   By: Lowella Grip M.D.   On: 09/26/2013 19:29    Medications  OxyCODONE (OXYCONTIN) 12 hr tablet 15 mg (not administered)  HYDROmorphone (DILAUDID) injection 1 mg (1 mg Intravenous Given 09/26/13 2209)     MDM   Final diagnoses:  Chronic pain  Hypercalcemia   Pt has elevated calcium level.  Pt is tolerating oral fluids.  Does have history of cancer.  Will  need outpatient follow up.  Pt has been having pain in his back, arm and legs.  No acute signs of infection.   May be withdrawing from his opiates.  He has an appointment with pcp tomorrow.  Stable for discharge.    Dorie Rank, MD 09/26/13 2337

## 2013-09-26 NOTE — ED Notes (Signed)
Patient transported to X-ray 

## 2013-09-26 NOTE — ED Notes (Signed)
Pt talking on phone  States pain is an 8/10 but states he does not want anything else at this time

## 2013-09-26 NOTE — ED Notes (Signed)
Pt returned from CT °

## 2013-09-26 NOTE — ED Notes (Signed)
Patient is from home. Patient has hx of lung and skin cancer. Patient rates pain 10/10 patient is on home oxy which he is out at this time. Last fill May 8th took last medications yesterday. Patient has skin cancer lesion noted left upper thigh posterior that was open to air.

## 2013-09-26 NOTE — ED Notes (Signed)
Bed: WA07 Expected date:  Expected time:  Means of arrival:  Comments: ems- lung cancer

## 2013-09-27 ENCOUNTER — Ambulatory Visit (HOSPITAL_BASED_OUTPATIENT_CLINIC_OR_DEPARTMENT_OTHER): Payer: Medicare Other | Admitting: Internal Medicine

## 2013-09-27 ENCOUNTER — Encounter: Payer: Self-pay | Admitting: Internal Medicine

## 2013-09-27 ENCOUNTER — Telehealth: Payer: Self-pay | Admitting: Internal Medicine

## 2013-09-27 VITALS — BP 101/64 | HR 116 | Temp 98.8°F | Resp 18 | Ht 69.0 in | Wt 124.4 lb

## 2013-09-27 DIAGNOSIS — R5381 Other malaise: Secondary | ICD-10-CM

## 2013-09-27 DIAGNOSIS — C3410 Malignant neoplasm of upper lobe, unspecified bronchus or lung: Secondary | ICD-10-CM

## 2013-09-27 DIAGNOSIS — B20 Human immunodeficiency virus [HIV] disease: Secondary | ICD-10-CM

## 2013-09-27 DIAGNOSIS — R109 Unspecified abdominal pain: Secondary | ICD-10-CM

## 2013-09-27 DIAGNOSIS — R05 Cough: Secondary | ICD-10-CM

## 2013-09-27 DIAGNOSIS — C792 Secondary malignant neoplasm of skin: Secondary | ICD-10-CM

## 2013-09-27 DIAGNOSIS — R059 Cough, unspecified: Secondary | ICD-10-CM

## 2013-09-27 DIAGNOSIS — R599 Enlarged lymph nodes, unspecified: Secondary | ICD-10-CM

## 2013-09-27 DIAGNOSIS — R5383 Other fatigue: Secondary | ICD-10-CM

## 2013-09-27 DIAGNOSIS — R0989 Other specified symptoms and signs involving the circulatory and respiratory systems: Secondary | ICD-10-CM

## 2013-09-27 LAB — RPR

## 2013-09-27 MED ORDER — OXYCODONE HCL 5 MG PO TABS: 5.0000 mg | ORAL_TABLET | ORAL | Status: DC | PRN

## 2013-09-27 NOTE — ED Notes (Signed)
Social work gave pt Clinical cytogeneticist. Called cab.

## 2013-09-27 NOTE — Progress Notes (Signed)
Patient stated he could get home by cab and his wife could help him in the house. Patient states he did not feel comfortable riding bus alone. Patient and csw called pt wife to confirm plan. Pt provided with cab voucher.   No further Clinical Social Work needs, signing off.   Belia Heman, [place]  ED CSW 09/27/2013 952am

## 2013-09-27 NOTE — ED Notes (Signed)
Went back in to discharge the pt and he states he has no transportation home until morning

## 2013-09-27 NOTE — ED Notes (Signed)
Talked to pt's wife that states she is unable to come get her husband  She states he uses [facility] to get to his dr appointments and she is not able to get him home by herself

## 2013-09-27 NOTE — Telephone Encounter (Signed)
gv adn printd appt sched and avs for opt fro June....radion and Tina cs to call pt...gv pt bariu.Marland KitchenMarland Kitchen

## 2013-09-27 NOTE — ED Notes (Signed)
Attempted to call pt's wife to come pick him up but unable to get call to go through

## 2013-09-27 NOTE — ED Notes (Signed)
Went in to discharge pt and pt states he was not aware he was going home  Pt requesting to speak to the dr   EDP Tomi Bamberger notified states he already spoke to the pt regarding his disposition

## 2013-09-27 NOTE — ED Notes (Signed)
Patient Wife Roger Walton [phone]

## 2013-09-27 NOTE — Progress Notes (Addendum)
[place] Telephone:([phone]) [phone]   Fax:(336) [phone]  OFFICE PROGRESS NOTE  Bobby Rumpf, MD [address] [place] [address] [place] [address]  DIAGNOSIS: Metastatic non-small cell lung cancer, squamous cell carcinoma initially diagnosed asStage IIB/IIIA  presenting as a right Pancoast tumor diagnosed in March of 2014.  PRIOR THERAPY:   1) Concurrent chemoradiation with weekly carboplatin for AUC of 2 and paclitaxol 45 mg/M2, status post 5 weekly doses. Last dose was given on 08/24/2012 with no significant response.  2) Systemic chemotherapy with carboplatin for AUC of 5 on day 1 and gemcitabine 1000 mg/M2 on days 1 and 8 every 3 weeks. First dose given on 10/28/2012. Status post day 1 of cycle 1, day 8 held secondary to neutropenia. Also status post day 1 of cycle 2. 3) status post stereotactic radiotherapy to enlarging right lung nodules under the care of Dr. Tammi Klippel.  CURRENT THERAPY:  None.  INTERVAL HISTORY: Roger Walton 58 y.o. male returns to the clinic today for followup visit accompanied by his wife and daughter. The patient recently underwent wide excision of cancer from the left buttock and leg under the care of Dr. Lucia Gaskins on 08/24/2013. The final pathology showed the ulcerative and invasive squamous cell carcinoma with invasive tumor in less than 0.1 mm from the deep margin. He was seen a few days ago by Dr. Lucia Gaskins and the wound looks good. He also noticed enlarging left ingoinal lymphadenopathy that is tender to palpation. He continues to have increasing fatigue and weakness as well as pain in the left inguinal area and back as well as the left buttock. He continues to have dry cough and chest congestion but no significant shortness of breath. He also has weakness and numbness in the right arm. He was scheduled to have repeat CT scan of the chest, abdomen and pelvis on 09/23/2013 but he missed his appointment.   MEDICAL HISTORY: Past Medical History    Diagnosis Date  . HIV infection dx'd 2008  . SOB (shortness of breath)   . Chronic pain in right shoulder     scapula and arm for 2 months   . Anxiety   . Protein-calorie malnutrition   . Tegretol toxicity     history of  . 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 Grand mal seizure 03/04/12  . Epileptic seizures     "I've had them all my life" (08/24/2013)    ALLERGIES:  has No Known Allergies.  MEDICATIONS:  Current Outpatient Prescriptions  Medication Sig Dispense Refill  . carbamazepine (TEGRETOL) 200 MG tablet take 3 tablets by mouth every morning 2 AT NOON and 2 tablets by mouth at bedtime  720 tablet  2  . efavirenz-emtricitabine-tenofovir (ATRIPLA) 600-200-300 MG per tablet Take 1 tablet by mouth at bedtime.  30 tablet  11  . gabapentin (NEURONTIN) 300 MG capsule Take 1 capsule (300 mg total) by mouth 3 (three) times daily.  90 capsule  5  . megestrol (MEGACE) 40 MG/ML suspension take 10 milliliters (2 teaspoonfuls) by mouth once daily  240 mL  2  . oxyCODONE (OXY IR/ROXICODONE) 5 MG immediate release tablet Take 1 tablet (5 mg total) by mouth every 4 (four) hours as needed for severe pain.  12 tablet  0  . OxyCODONE (OXYCONTIN) 15 mg T12A 12 hr tablet Take 1 tablet (15 mg total) by mouth  every 12 (twelve) hours.  60 tablet  0  . PHENobarbital (LUMINAL) 64.8 MG tablet Take 1 tablet (64.8 mg total) by mouth at bedtime.  90 tablet  2   No current facility-administered medications for this visit.    REVIEW OF SYSTEMS:  A comprehensive review of systems was negative except for: Constitutional: positive for anorexia, fatigue and weight loss Respiratory: positive for cough Musculoskeletal: positive for back pain, bone pain and muscle weakness   PHYSICAL EXAMINATION: General appearance: alert, cooperative and no distress Head: Normocephalic, without obvious abnormality, atraumatic Neck: no  adenopathy Lymph nodes: Cervical, supraclavicular, and axillary nodes normal. Resp: clear to auscultation bilaterally Cardio: regular rate and rhythm, S1, S2 normal, no murmur, click, rub or gallop GI: soft, non-tender; bowel sounds normal; no masses,  no organomegaly Extremities: extremities normal, atraumatic, no cyanosis or edema Neurologic: Alert and oriented X 3, normal strength and tone. Normal symmetric reflexes. Normal coordination and gait    ECOG PERFORMANCE STATUS: 2 - Symptomatic, <50% confined to bed  There were no vitals taken for this visit.  LABORATORY DATA: Lab Results  Component Value Date   WBC 5.3 09/26/2013   HGB 13.3 09/26/2013   HCT 38.5* 09/26/2013   MCV 98.2 09/26/2013   PLT 213 09/26/2013      Chemistry      Component Value Date/Time   NA 143 09/26/2013 1820   NA 132* 08/09/2013 1340   K 3.6* 09/26/2013 1820   K 4.2 08/09/2013 1340   CL 107 09/26/2013 1820   CL 103 08/24/2012 1129   CO2 24 09/26/2013 1820   CO2 23 08/09/2013 1340   BUN 18 09/26/2013 1820   BUN 10.0 08/09/2013 1340   CREATININE 0.82 09/26/2013 1820   CREATININE 0.9 08/09/2013 1340   CREATININE 0.95 05/05/2013 1530      Component Value Date/Time   CALCIUM 11.8* 09/26/2013 1820   CALCIUM 9.9 08/09/2013 1340   ALKPHOS 84 08/20/2013 2147   ALKPHOS 91 08/09/2013 1340   AST 20 08/20/2013 2147   AST 20 08/09/2013 1340   ALT 13 08/20/2013 2147   ALT 10 08/09/2013 1340   BILITOT <0.2* 08/20/2013 2147   BILITOT 0.30 08/09/2013 1340       RADIOGRAPHIC STUDIES:  ASSESSMENT AND PLAN: The patient is a very pleasant 58 years old African American male with Metastatic non-small cell lung cancer initially diagnosed as stage IIIA status post a course of concurrent chemoradiation followed by 2 cycles of chemotherapy was carboplatin and gemcitabine discontinued secondary to intolerance and the patient also received stereotactic radiotherapy to her enlarging right lung nodules. He has been observation for the last few months but the  patient developed posterior left lower extremity wound and the debridement showed metastatic squamous cell carcinoma. He recently underwent wide excision of the metastatic mass and ulcer under the care of Dr. Lucia Gaskins and the wound is healing much better. I have a lengthy discussion with the patient and his family today about his condition and treatment options. I recommended for him to have repeat CT scan of the head, chest, abdomen and pelvis for restaging of his disease after he missed his appointment last week. For pain management I gave him a refill of Percocet. I also referred the patient to Dr. Tammi Klippel for consideration of palliative radiotherapy to the left inguinal lymphadenopathy. I would see him back for followup visit in 2 weeks for evaluation and discussion of his treatment options based on the imaging results. He was also advised  to call immediately if he has any concerning symptoms in the interval.   Disclaimer: This note was dictated with voice recognition software. Similar sounding words can inadvertently be transcribed and may not be corrected upon review.  Curt Bears, MD 09/27/2013

## 2013-09-29 ENCOUNTER — Telehealth: Payer: Self-pay | Admitting: *Deleted

## 2013-09-29 NOTE — Telephone Encounter (Signed)
Received call from Deary at [facility] who has been doing wound care for pt's wounds.  She states that pt's wife said that she thought Dr Vista Mink was going to call in hospice.  Inform Peggy that according to Dr [facility] note, right now he is doing a restaging scan and will review results in a few weeks, no mention of hospice.  Peggy asked that if hospice gets called in that we let them know so they can d/c their services.  SLJ

## 2013-09-30 ENCOUNTER — Telehealth: Payer: Self-pay | Admitting: Oncology

## 2013-09-30 ENCOUNTER — Ambulatory Visit: Payer: Medicare Other | Admitting: Radiation Oncology

## 2013-09-30 ENCOUNTER — Other Ambulatory Visit: Payer: Medicare Other | Admitting: Radiation Oncology

## 2013-09-30 NOTE — Telephone Encounter (Signed)
Olin Hauser called and asked what time Mainor needs to be here tomorrow.  Advised that he needs to be here at 1:45 for his 2:00 CT SIM.  [name] then asked when Marquie will be able to drink his contrast for his CT scan at 3:00.  She was told that he needs to drink it at 1:00 and 2:00.  Checked with Eliezer Lofts in [place] SIM and it is OK for him to drink the contrast at 1 and 2.  Called [name] back and let her know to bring the contrast with them.  Also set up a pick up  time for 12:40-1:10 with [facility].  Advised [name] of the time.

## 2013-10-01 ENCOUNTER — Encounter: Payer: Self-pay | Admitting: *Deleted

## 2013-10-01 ENCOUNTER — Other Ambulatory Visit: Payer: Medicare Other | Admitting: Radiation Oncology

## 2013-10-01 ENCOUNTER — Encounter: Payer: Self-pay | Admitting: Radiation Oncology

## 2013-10-01 ENCOUNTER — Ambulatory Visit: Payer: Medicare Other | Admitting: Radiation Oncology

## 2013-10-01 ENCOUNTER — Ambulatory Visit
Admission: RE | Admit: 2013-10-01 | Discharge: 2013-10-01 | Disposition: A | Discharge status: Home / Self Care | Payer: Medicare Other | Source: Ambulatory Visit | Attending: Radiation Oncology | Admitting: Radiation Oncology

## 2013-10-01 ENCOUNTER — Ambulatory Visit: Payer: Medicare Other

## 2013-10-01 ENCOUNTER — Other Ambulatory Visit: Payer: Self-pay | Admitting: Internal Medicine

## 2013-10-01 ENCOUNTER — Ambulatory Visit (HOSPITAL_COMMUNITY)
Admission: RE | Admit: 2013-10-01 | Discharge: 2013-10-01 | Disposition: A | Discharge status: Home / Self Care | Payer: Medicare Other | Source: Ambulatory Visit | Attending: Internal Medicine | Admitting: Internal Medicine

## 2013-10-01 DIAGNOSIS — R599 Enlarged lymph nodes, unspecified: Secondary | ICD-10-CM | POA: Insufficient documentation

## 2013-10-01 DIAGNOSIS — B20 Human immunodeficiency virus [HIV] disease: Secondary | ICD-10-CM

## 2013-10-01 DIAGNOSIS — Z21 Asymptomatic human immunodeficiency virus [HIV] infection status: Secondary | ICD-10-CM | POA: Insufficient documentation

## 2013-10-01 DIAGNOSIS — Z9221 Personal history of antineoplastic chemotherapy: Secondary | ICD-10-CM | POA: Insufficient documentation

## 2013-10-01 DIAGNOSIS — R0602 Shortness of breath: Secondary | ICD-10-CM | POA: Insufficient documentation

## 2013-10-01 DIAGNOSIS — Z510 Encounter for antineoplastic radiation therapy: Secondary | ICD-10-CM | POA: Insufficient documentation

## 2013-10-01 DIAGNOSIS — M799 Soft tissue disorder, unspecified: Secondary | ICD-10-CM | POA: Insufficient documentation

## 2013-10-01 DIAGNOSIS — C3410 Malignant neoplasm of upper lobe, unspecified bronchus or lung: Secondary | ICD-10-CM

## 2013-10-01 DIAGNOSIS — C3490 Malignant neoplasm of unspecified part of unspecified bronchus or lung: Secondary | ICD-10-CM | POA: Insufficient documentation

## 2013-10-01 DIAGNOSIS — C774 Secondary and unspecified malignant neoplasm of inguinal and lower limb lymph nodes: Secondary | ICD-10-CM | POA: Insufficient documentation

## 2013-10-01 MED ORDER — IOHEXOL 300 MG/ML  SOLN
100.0000 mL | Freq: Once | INTRAMUSCULAR | Status: AC | PRN
  Administered 2013-10-01: 100 mL via INTRAVENOUS

## 2013-10-01 NOTE — Progress Notes (Signed)
  Radiation Oncology         [phone]) [phone] ________________________________  Name: DHRUV CHRISTINA MRN: [id number]  Date: 10/01/2013  DOB: December 10, 1955  SIMULATION AND TREATMENT PLANNING NOTE  DIAGNOSIS:     ICD-9-CM  1. Lung cancer, upper lobe 162.3  2. Painful Left Inguinal Nodal Metastases. 196.5   NARRATIVE:  The patient was brought to the [facility].  Identity was confirmed.  All relevant records and images related to the planned course of therapy were reviewed.  The patient freely provided informed written consent to proceed with treatment after reviewing the details related to the planned course of therapy. The consent form was witnessed and verified by the simulation staff.  Then, the patient was set-up in a stable reproducible  supine position for radiation therapy.  CT images were obtained.  Surface markings were placed.  The CT images were loaded into the planning software.  Then the target and avoidance structures were contoured.  Treatment planning then occurred.  The radiation prescription was entered and confirmed.  Then, I designed and supervised the construction of a total of 6 medically necessary complex treatment devices including an alpha cradle immobilization device for proper like positioning and 5 multileaf collimator aperture as to conform radiation around the targeted lymphadenopathy.  I have requested : 3D Simulation  I have requested a DVH of the following structures: Target, genitalia, rectum, and bladder.Marland Kitchen   PLAN:  The patient will receive 24 Gy in 6 fraction.  ________________________________  Sheral Apley Tammi Klippel, M.D.

## 2013-10-01 NOTE — Progress Notes (Signed)
Patient in CT SIM.  Possible bed bug found in the crease of his left shoe.  Removed bed bug with tweezers and placed in specimen cup.

## 2013-10-01 NOTE — Progress Notes (Signed)
[place] Work  Clinical Social Work received call from patient's daughter, Maree Erie Chandler(phone # [phone]).  Patient's daughter stated she recently attended patient appointment with Dr. Julien Nordmann and she has concerns regarding patient's care at home.  She stated patient is not eating, he is unable to afford co-pay for medications, and he may not be receiving proper care at home.  CSW shared with Ms. Tamera Punt she is unable to discuss patient situation because she is not on patient release, but am able to listen to her concerns.  CSW encouraged patient's daughter to attend upcoming appointment in radiation oncology- CSW intends to engage in family meeting.  CSW contacted patient's spouse. Patient and spouse are agreeable to meeting today.  Mrs. Whitby stated CSW could speak with patient's daughter- CSW explained [place] requires a signed release. CSW will meet with patient/family today before [facility] appointment.   [name], MSW, LCSW, OSW-C Clinical Social Worker [place] [phone]

## 2013-10-05 ENCOUNTER — Telehealth: Payer: Self-pay | Admitting: Radiation Oncology

## 2013-10-05 ENCOUNTER — Encounter: Payer: Self-pay | Admitting: Radiation Oncology

## 2013-10-05 NOTE — Progress Notes (Signed)
Informed by the patient's wife that they rent their home but, it is owned by [employer]. Reached out to Schaller at [phone] ext. [phone]. He confirms his company does not own this residence but, instead the Stevinson family does. He goes on to explain that [employer], [employer] the property. Unable to reach anyone with [employer] to make them aware of bed bugs found on residence while in our care.

## 2013-10-05 NOTE — Telephone Encounter (Signed)
Phoned patient's daughter, Maree Erie, to discuss transportation issues reference treatment tomorrow. She was not available nor had an alternate number to be reach at. Will reach out to [employer], LCSW and [facility] to find a solution.

## 2013-10-06 ENCOUNTER — Encounter (INDEPENDENT_AMBULATORY_CARE_PROVIDER_SITE_OTHER): Payer: Medicare Other | Admitting: Surgery

## 2013-10-06 ENCOUNTER — Encounter: Payer: Self-pay | Admitting: Radiation Oncology

## 2013-10-06 ENCOUNTER — Telehealth: Payer: Self-pay | Admitting: Radiation Oncology

## 2013-10-06 ENCOUNTER — Ambulatory Visit: Admission: RE | Admit: 2013-10-06 | Payer: Medicare Other | Source: Ambulatory Visit | Admitting: Radiation Oncology

## 2013-10-06 NOTE — Telephone Encounter (Signed)
Phoned patient at home. Spoke with his wife. Made her aware [facility] will be at their home on Friday, June 19 to pick them up at 1515. She verbalized understanding. She understands [facility] will drop them at the back door of the cancer center. Also, she verbally agreed to wear a bunny suit along with her husband to contain bed bugs.

## 2013-10-07 ENCOUNTER — Ambulatory Visit: Admission: RE | Admit: 2013-10-07 | Payer: Medicare Other | Source: Ambulatory Visit | Admitting: Radiation Oncology

## 2013-10-07 ENCOUNTER — Ambulatory Visit: Payer: Medicare Other

## 2013-10-08 ENCOUNTER — Ambulatory Visit: Payer: Medicare Other

## 2013-10-08 ENCOUNTER — Telehealth: Payer: Self-pay | Admitting: Radiation Oncology

## 2013-10-08 NOTE — Telephone Encounter (Signed)
Phoned to check status of arrival. No answer. No option to leave a message.

## 2013-10-08 NOTE — Telephone Encounter (Signed)
Finally, reached patient's wife. She reports that they turned SC[facility] away because they "weren't ready" despite all efforts. Will try again Monday to treat patient. SCAT has been arranged for Monday, June 22nd.

## 2013-10-11 ENCOUNTER — Telehealth: Payer: Self-pay | Admitting: Radiation Oncology

## 2013-10-11 ENCOUNTER — Ambulatory Visit: Admission: RE | Admit: 2013-10-11 | Payer: Medicare Other | Source: Ambulatory Visit

## 2013-10-11 ENCOUNTER — Emergency Department (HOSPITAL_COMMUNITY)
Admission: EM | Admit: 2013-10-11 | Discharge: 2013-10-11 | Disposition: A | Discharge status: Home / Self Care | Payer: Medicare Other | Source: Home / Self Care | Attending: Emergency Medicine | Admitting: Emergency Medicine

## 2013-10-11 ENCOUNTER — Telehealth: Payer: Self-pay | Admitting: *Deleted

## 2013-10-11 ENCOUNTER — Encounter (HOSPITAL_COMMUNITY): Payer: Self-pay | Admitting: Emergency Medicine

## 2013-10-11 DIAGNOSIS — Z87891 Personal history of nicotine dependence: Secondary | ICD-10-CM | POA: Insufficient documentation

## 2013-10-11 DIAGNOSIS — G8929 Other chronic pain: Secondary | ICD-10-CM

## 2013-10-11 DIAGNOSIS — M79609 Pain in unspecified limb: Secondary | ICD-10-CM

## 2013-10-11 DIAGNOSIS — Z21 Asymptomatic human immunodeficiency virus [HIV] infection status: Secondary | ICD-10-CM

## 2013-10-11 DIAGNOSIS — R5381 Other malaise: Secondary | ICD-10-CM | POA: Diagnosis not present

## 2013-10-11 DIAGNOSIS — F411 Generalized anxiety disorder: Secondary | ICD-10-CM

## 2013-10-11 DIAGNOSIS — Z79899 Other long term (current) drug therapy: Secondary | ICD-10-CM

## 2013-10-11 DIAGNOSIS — G40909 Epilepsy, unspecified, not intractable, without status epilepticus: Secondary | ICD-10-CM

## 2013-10-11 DIAGNOSIS — Z8639 Personal history of other endocrine, nutritional and metabolic disease: Secondary | ICD-10-CM | POA: Insufficient documentation

## 2013-10-11 DIAGNOSIS — M79605 Pain in left leg: Secondary | ICD-10-CM

## 2013-10-11 DIAGNOSIS — B20 Human immunodeficiency virus [HIV] disease: Secondary | ICD-10-CM | POA: Diagnosis not present

## 2013-10-11 DIAGNOSIS — Z862 Personal history of diseases of the blood and blood-forming organs and certain disorders involving the immune mechanism: Secondary | ICD-10-CM | POA: Insufficient documentation

## 2013-10-11 DIAGNOSIS — C44599 Other specified malignant neoplasm of skin of other part of trunk: Secondary | ICD-10-CM | POA: Insufficient documentation

## 2013-10-11 DIAGNOSIS — Z85118 Personal history of other malignant neoplasm of bronchus and lung: Secondary | ICD-10-CM | POA: Insufficient documentation

## 2013-10-11 DIAGNOSIS — R5383 Other fatigue: Secondary | ICD-10-CM | POA: Diagnosis not present

## 2013-10-11 DIAGNOSIS — C44509 Unspecified malignant neoplasm of skin of other part of trunk: Secondary | ICD-10-CM | POA: Insufficient documentation

## 2013-10-11 NOTE — Progress Notes (Signed)
10/11/2013 A. Vencent Hauschild RNCM 1641pm Received phone call from Alben Spittle for [facility] who has received new home health orders for RN, PT, OT, and aide.  Erasmo Downer confirmed patient is currently active with nursing only.

## 2013-10-11 NOTE — Telephone Encounter (Signed)
If his legs are paralyzed, he needs to proceed to the ED emergently for examination and possible spinal imaging. MM

## 2013-10-11 NOTE — Telephone Encounter (Signed)
Attempting to determine if patient is stable for radiation treatment. Spoke with Albina Billet, RN in ED assigned to patient. She reports the patient is having increased numbness and pain in his left leg. Reports patient is being seen by social work but no other scans or lab have been ordered. Radiation cancelled for today per Dr. Tammi Klippel. Informed Santiago Glad, RN, [name] (patient's wife), and Felicity Pellegrini, RT of this findings.

## 2013-10-11 NOTE — Progress Notes (Signed)
10/11/2013 A. Ferrero RNCM 1814pm EDCM informed patient's wife that patient will be coming home.  Patient to go home via [facility].  EDCM asked patient's wife if patient's daughter was coming to pick patient up.  Patient's wife was not sure and will call her daughter.  EDCM informed patient's wife that patient will be going home via ambulance and would need her daughter to take patient's commode home.  Patient's wife verbalized understanding.  EDCM provided phone number for wife to call back.

## 2013-10-11 NOTE — ED Provider Notes (Signed)
CSN: [id number]     Arrival date & time 10/11/13  1447 History   First MD Initiated Contact with Patient 10/11/13 1503     Chief Complaint  Patient presents with  . Leg Pain    Patient complaining of pain and numbness in both legs. Hx of squamous cell carcinoma.      HPI Patient reports ongoing left leg pain and difficulty ambulating getting out of bed secondary to pain.  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  It sounds as though he is requiring more care at home as of recently.  The patient is currently on 15 mg OxyContin as well as 5 mg when necessary oxycodone.  He states when he takes his pain medication that helps his pain.  No other complaints.  No fevers or chills.  No numbness or tingling of his left lower extremity.  No abdominal pain.  No fevers or chills.  No new rash.   Past Medical History  Diagnosis Date  . HIV infection dx'd 2008  . SOB (shortness of breath)   . Chronic pain in right shoulder     scapula and arm for 2 months   . Anxiety   . Protein-calorie malnutrition   . Tegretol toxicity     history of  . 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 Grand mal seizure 03/04/12  . Epileptic seizures     "I've had them all my life" (08/24/2013)   Past Surgical History  Procedure Laterality Date  . Fine needle aspiration    . Skin cancer excision Left 08/24/2013    wide excision cancer thigh/buttocks  . Wound debridement Left 06/24/2013    posterior thigh/notes 06/24/2013  . Melanoma excision Left 08/24/2013    Procedure: wide excision cancer of left buttock / leg ;  Surgeon: Shann Medal, MD;  Location: [facility] OR;  Service: General;  Laterality: Left;   Family History  Problem Relation Age of Onset  . Colon cancer Neg Hx    History  Substance Use Topics  . Smoking status: Former Smoker -- 1.00 packs/day for 43 years    Types: Cigarettes  . Smokeless  tobacco: Never Used     Comment: 08/24/2013 "quit smoking cigarettes ~ 2 months ago"  . Alcohol Use: 3.6 oz/week    6 Cans of beer per week    Review of Systems  All other systems reviewed and are negative.     Allergies  Review of patient's allergies indicates no known allergies.  Home Medications   Prior to Admission medications   Medication Sig Start Date End Date Taking? Authorizing Provider  carbamazepine (TEGRETOL) 200 MG tablet take 3 tablets by mouth every morning 2 AT NOON and 2 tablets by mouth at bedtime 08/22/13  Yes Verlee Monte, MD  efavirenz-emtricitabine-tenofovir (ATRIPLA) 600-200-300 MG per tablet Take 1 tablet by mouth at bedtime. 08/17/13  Yes Campbell Riches, MD  gabapentin (NEURONTIN) 300 MG capsule Take 1 capsule (300 mg total) by mouth 3 (three) times daily. 04/28/13  Yes Lora Paula, MD  oxyCODONE (OXY IR/ROXICODONE) 5 MG immediate release tablet Take 1 tablet (5 mg total) by mouth every 4 (four) hours as needed for severe pain. 09/27/13  Yes Curt Bears, MD  OxyCODONE (OXYCONTIN) 15 mg T12A 12 hr tablet Take 1 tablet (15 mg total) by mouth every 12 (twelve) hours.  09/15/13  Yes Lora Paula, MD  PHENobarbital (LUMINAL) 64.8 MG tablet Take 1 tablet (64.8 mg total) by mouth at bedtime. 07/20/13  Yes Campbell Riches, MD  megestrol (MEGACE) 40 MG/ML suspension take 10 milliliters (2 teaspoonfuls) by mouth once daily 08/27/13   Campbell Riches, MD   BP 115/65  Pulse 99  Temp(Src) 99.5 F (37.5 C) (Oral)  Resp 14  SpO2 93% Physical Exam  Nursing note and vitals reviewed. Constitutional: He is oriented to person, place, and time. He appears well-developed and well-nourished.  HENT:  Head: Normocephalic and atraumatic.  Eyes: EOM are normal.  Neck: Normal range of motion.  Cardiovascular: Normal rate, regular rhythm, normal heart sounds and intact distal pulses.   Pulmonary/Chest: Effort normal and breath sounds normal. No respiratory distress.   Abdominal: Soft. He exhibits no distension. There is no tenderness.  Musculoskeletal: Normal range of motion.  Full range of motion of left ankle left knee and left hip.  Healing squamous cell carcinoma status post excision of his left but without secondary signs of infection.  No unilateral leg swelling as compared to the other.  Normal PT and DP pulses left foot.  Full range of motion of his left hip.  Patient appears to have contractures of his bilateral thighs.  Neurological: He is alert and oriented to person, place, and time.  Skin: Skin is warm and dry.  Psychiatric: He has a normal mood and affect. Judgment normal.    ED Course  Procedures (including critical care time) Labs Review Labs Reviewed - No data to display  Imaging Review No results found.   EKG Interpretation None      MDM   Final diagnoses:  Left leg pain    Mr. with case management who had set the patient up with home health needs including RN, PT, OT, nursing aide.  A bedside commode has also been ordered and delivered to the patient's bedside by the home health agency in the ER.  Case management had a long discussion with the patient's wife and the patient's wife is agreeable to outpatient plan with additional assistance at home.  Close followup with primary care physician.  Medical screening examination completed.  No signs of infection.  No signs of ischemia.  On quality pain medicine at home    Hoy Morn, MD 10/11/13 [phone]

## 2013-10-11 NOTE — Telephone Encounter (Signed)
Phoned wife to remind her of her husband's appointment for tonight. She verbalized understanding. She confirmed they plan to present for treatment. Informed Heather, RT on linac 3 of this finding.

## 2013-10-11 NOTE — ED Notes (Signed)
Per EMS, patient is having pain in left leg, and having difficulty ambulating and getting out of bed. Patient has squamous cell carcinoma to the upper left leg - posterior, just under the left buttocks cheek.  Patient reports that he does not have home health assistance. Patient's wife is caregiver.

## 2013-10-11 NOTE — ED Notes (Signed)
[facility] here to transport pt to home.

## 2013-10-11 NOTE — ED Notes (Signed)
Provided Pt with 216mL of OJ

## 2013-10-11 NOTE — Telephone Encounter (Signed)
Golden Pop called the therapist Faith, RT and told her she couldn't get the patient out of the bed he couldn't move his legs, before I could call her back she called  again asking if she should call 911 and bring him here to the cancer center?", I informed her no, if she does call 911 it should be to take him to the emergency room her at [facility]",, asked if he could move his legs"NO" I informed her that Joaquim Lai ,RN was at lunch and that when she gets back will  have her call you to see what you have decidedDr.Manning is in [place] till this afternoon, will in basket both him and [facility]

## 2013-10-11 NOTE — Telephone Encounter (Signed)
Spoke with Golden Pop, patient has gone to the Emergency room she requested to be called when we know how he is, informed her once he gets to the ED and they run tests , Joaquim Lai will call you later this evening with any updates,[name] thanked [place] for return call again,infomrd Noland Fordyce  and inbasket  Dr.MAnning,MD 2:34 PM

## 2013-10-11 NOTE — Progress Notes (Signed)
10/11/2013 A. Ferrero RNCM 1845pm EDCM spoke to patient's wife [name] who reports patient's daughter Alwyn Ren will be picking up his bedside commode from the ED this evening.  Patient's wife now requesting a walker because the one he has at home is, "Not that steady." [facility] faxed dme order to [facility] for walker at 1913pm with confirmation of receipt at 1918pm.

## 2013-10-11 NOTE — Progress Notes (Signed)
  CARE MANAGEMENT ED NOTE 10/11/2013  Patient:  STEPHONE, GUM   Account Number:  [id number]  Date Initiated:  10/11/2013  Documentation initiated by:  Livia Snellen  Subjective/Objective Assessment:   Patient presents to Ed with pain and numbness in left leg.     Subjective/Objective Assessment Detail:     Action/Plan:   Action/Plan Detail:   Anticipated DC Date:       Status Recommendation to Physician:   Result of Recommendation:    Other ED Services  Consult Working [place]  CM consult  Other   PAC Choice  [place]   Choice offered to / List presented to:  C-1 Patient  DME arranged  BEDSIDE COMMODE     DME agency  [place] arranged  HH-1 RN  [place].    Status of service:  Completed, signed off  ED Comments:   ED Comments Detail:  10/11/2013 A. Christen Bame RNCM 1619pm EDCM spoke to patient's wife [name] at [phone]. Patient's wife reports that a nurse from [place] is scheduled to come see the patient on Thursday.  EDCM placed call to [place] to check what services patient is currently active with.  EDCM reviewed home health services such as RN, PT, OT and aide to be added to current home health orders.  EDCM also reviewed private duty nursing services and [facility] referral. Patient's wife is agreeable to above.  Patient's wife is requesting a bedside commode at home.  EDCM called [place] DME rep for [facility] for bedside commode to be delivered to [facility] ED tonite.  After reviewing all home health services and equipment, [facility] asked patient's wife if there was anything else she needed at home?  Patient's wife reports no further needs at this time.  No further EDCM needs at this time.    EDCM spoke to patient at bedside.  Patient reports he lives at home with his wife.  Patient reports [place]  has not been out to see him yet.  Patient noted to be wearing oxygen in the ED, patient does not wear oxygen at home.  Patient has a cane and walker at home.  Patient reports his wife does not work and is with the patient all day.  Patient's cousin Fraser Din (more like a sister to patient at bedside).  EDCM provided patient wih a list of home health agencies in [facility].  EDCM explained to patient and family member that he may receive a visiting RN, PT, OT, aide and Education officer, museum.  EDCM also provided patient with a list of private duty nursing agencies and explained to patient that it may be an out of pocket expense for him.  Patient confirms his pcp is Dr. Phineas Inches who is a participant in the [facility]. EDCM has received permission from patient and family member to place referral into [facility].  EDCM has received permission from patient to contact his wife regarding discharge planning.  Discussed patient with EDP.

## 2013-10-12 ENCOUNTER — Ambulatory Visit (HOSPITAL_BASED_OUTPATIENT_CLINIC_OR_DEPARTMENT_OTHER): Payer: Medicare Other | Admitting: Internal Medicine

## 2013-10-12 ENCOUNTER — Encounter: Payer: Self-pay | Admitting: Radiation Oncology

## 2013-10-12 ENCOUNTER — Ambulatory Visit
Admission: RE | Admit: 2013-10-12 | Discharge: 2013-10-12 | Disposition: A | Discharge status: Home / Self Care | Payer: Medicare Other | Source: Ambulatory Visit | Attending: Radiation Oncology | Admitting: Radiation Oncology

## 2013-10-12 ENCOUNTER — Inpatient Hospital Stay (HOSPITAL_COMMUNITY)
Admission: AD | Admit: 2013-10-12 | Discharge: 2013-10-20 | DRG: 974 | Disposition: E | Payer: Medicare Other | Source: Ambulatory Visit | Attending: Internal Medicine | Admitting: Internal Medicine

## 2013-10-12 ENCOUNTER — Encounter: Payer: Self-pay | Admitting: Internal Medicine

## 2013-10-12 ENCOUNTER — Telehealth: Payer: Self-pay | Admitting: *Deleted

## 2013-10-12 ENCOUNTER — Encounter: Payer: Self-pay | Admitting: *Deleted

## 2013-10-12 ENCOUNTER — Ambulatory Visit: Payer: Medicare Other

## 2013-10-12 ENCOUNTER — Encounter (HOSPITAL_COMMUNITY): Payer: Self-pay

## 2013-10-12 VITALS — BP 89/60 | HR 119 | Temp 100.0°F

## 2013-10-12 DIAGNOSIS — B20 Human immunodeficiency virus [HIV] disease: Secondary | ICD-10-CM

## 2013-10-12 DIAGNOSIS — G40309 Generalized idiopathic epilepsy and epileptic syndromes, not intractable, without status epilepticus: Secondary | ICD-10-CM | POA: Diagnosis present

## 2013-10-12 DIAGNOSIS — R5381 Other malaise: Secondary | ICD-10-CM | POA: Diagnosis present

## 2013-10-12 DIAGNOSIS — Z79899 Other long term (current) drug therapy: Secondary | ICD-10-CM | POA: Diagnosis not present

## 2013-10-12 DIAGNOSIS — C774 Secondary and unspecified malignant neoplasm of inguinal and lower limb lymph nodes: Secondary | ICD-10-CM | POA: Diagnosis present

## 2013-10-12 DIAGNOSIS — J690 Pneumonitis due to inhalation of food and vomit: Secondary | ICD-10-CM | POA: Diagnosis present

## 2013-10-12 DIAGNOSIS — I498 Other specified cardiac arrhythmias: Secondary | ICD-10-CM | POA: Diagnosis not present

## 2013-10-12 DIAGNOSIS — J9600 Acute respiratory failure, unspecified whether with hypoxia or hypercapnia: Secondary | ICD-10-CM | POA: Diagnosis not present

## 2013-10-12 DIAGNOSIS — R627 Adult failure to thrive: Secondary | ICD-10-CM | POA: Diagnosis present

## 2013-10-12 DIAGNOSIS — R509 Fever, unspecified: Secondary | ICD-10-CM

## 2013-10-12 DIAGNOSIS — F411 Generalized anxiety disorder: Secondary | ICD-10-CM | POA: Diagnosis present

## 2013-10-12 DIAGNOSIS — C3410 Malignant neoplasm of upper lobe, unspecified bronchus or lung: Secondary | ICD-10-CM

## 2013-10-12 DIAGNOSIS — J449 Chronic obstructive pulmonary disease, unspecified: Secondary | ICD-10-CM | POA: Diagnosis present

## 2013-10-12 DIAGNOSIS — E876 Hypokalemia: Secondary | ICD-10-CM | POA: Diagnosis not present

## 2013-10-12 DIAGNOSIS — Z87891 Personal history of nicotine dependence: Secondary | ICD-10-CM

## 2013-10-12 DIAGNOSIS — I469 Cardiac arrest, cause unspecified: Secondary | ICD-10-CM | POA: Diagnosis not present

## 2013-10-12 DIAGNOSIS — R001 Bradycardia, unspecified: Secondary | ICD-10-CM

## 2013-10-12 DIAGNOSIS — Z681 Body mass index (BMI) 19 or less, adult: Secondary | ICD-10-CM

## 2013-10-12 DIAGNOSIS — G8929 Other chronic pain: Secondary | ICD-10-CM | POA: Diagnosis present

## 2013-10-12 DIAGNOSIS — I959 Hypotension, unspecified: Secondary | ICD-10-CM | POA: Diagnosis present

## 2013-10-12 DIAGNOSIS — Z515 Encounter for palliative care: Secondary | ICD-10-CM | POA: Diagnosis not present

## 2013-10-12 DIAGNOSIS — Z85820 Personal history of malignant melanoma of skin: Secondary | ICD-10-CM

## 2013-10-12 DIAGNOSIS — D0472 Carcinoma in situ of skin of left lower limb, including hip: Secondary | ICD-10-CM

## 2013-10-12 DIAGNOSIS — R0681 Apnea, not elsewhere classified: Secondary | ICD-10-CM | POA: Diagnosis not present

## 2013-10-12 DIAGNOSIS — E43 Unspecified severe protein-calorie malnutrition: Secondary | ICD-10-CM | POA: Diagnosis present

## 2013-10-12 DIAGNOSIS — R1311 Dysphagia, oral phase: Secondary | ICD-10-CM | POA: Diagnosis not present

## 2013-10-12 DIAGNOSIS — R5383 Other fatigue: Secondary | ICD-10-CM

## 2013-10-12 DIAGNOSIS — C792 Secondary malignant neoplasm of skin: Secondary | ICD-10-CM

## 2013-10-12 DIAGNOSIS — J189 Pneumonia, unspecified organism: Secondary | ICD-10-CM

## 2013-10-12 DIAGNOSIS — R599 Enlarged lymph nodes, unspecified: Secondary | ICD-10-CM | POA: Diagnosis present

## 2013-10-12 DIAGNOSIS — E860 Dehydration: Secondary | ICD-10-CM

## 2013-10-12 DIAGNOSIS — I442 Atrioventricular block, complete: Secondary | ICD-10-CM | POA: Diagnosis present

## 2013-10-12 DIAGNOSIS — G9340 Encephalopathy, unspecified: Secondary | ICD-10-CM

## 2013-10-12 DIAGNOSIS — R569 Unspecified convulsions: Secondary | ICD-10-CM

## 2013-10-12 DIAGNOSIS — M25519 Pain in unspecified shoulder: Secondary | ICD-10-CM | POA: Diagnosis present

## 2013-10-12 DIAGNOSIS — D638 Anemia in other chronic diseases classified elsewhere: Secondary | ICD-10-CM | POA: Diagnosis present

## 2013-10-12 DIAGNOSIS — R9431 Abnormal electrocardiogram [ECG] [EKG]: Secondary | ICD-10-CM | POA: Diagnosis present

## 2013-10-12 DIAGNOSIS — R4020 Unspecified coma: Secondary | ICD-10-CM

## 2013-10-12 DIAGNOSIS — J4489 Other specified chronic obstructive pulmonary disease: Secondary | ICD-10-CM | POA: Diagnosis present

## 2013-10-12 DIAGNOSIS — R531 Weakness: Secondary | ICD-10-CM

## 2013-10-12 DIAGNOSIS — C3411 Malignant neoplasm of upper lobe, right bronchus or lung: Secondary | ICD-10-CM

## 2013-10-12 LAB — COMPREHENSIVE METABOLIC PANEL
ALK PHOS: 87 U/L (ref 39–117)
ALT: 7 U/L (ref 0–53)
AST: 15 U/L (ref 0–37)
Albumin: 2.4 g/dL — ABNORMAL LOW (ref 3.5–5.2)
BUN: 17 mg/dL (ref 6–23)
CO2: 26 meq/L (ref 19–32)
Calcium: 11.4 mg/dL — ABNORMAL HIGH (ref 8.4–10.5)
Chloride: 104 mEq/L (ref 96–112)
Creatinine, Ser: 0.92 mg/dL (ref 0.50–1.35)
GFR calc Af Amer: >90 mL/min (ref >90)
GFR calc non Af Amer: 90 mL/min — ABNORMAL LOW (ref >90)
Glucose, Bld: 116 mg/dL — ABNORMAL HIGH (ref 70–99)
POTASSIUM: 3.5 meq/L — AB (ref 3.7–5.3)
SODIUM: 143 meq/L (ref 137–147)
TOTAL PROTEIN: 7.3 g/dL (ref 6.0–8.3)
Total Bilirubin: 0.3 mg/dL (ref 0.3–1.2)

## 2013-10-12 LAB — VITAMIN B12: Vitamin B-12: 644 pg/mL (ref 211–911)

## 2013-10-12 LAB — MRSA PCR SCREENING: MRSA by PCR: NEGATIVE

## 2013-10-12 LAB — CBC
HCT: 27.1 % — ABNORMAL LOW (ref 39.0–52.0)
HEMOGLOBIN: 9.3 g/dL — AB (ref 13.0–17.0)
MCH: 33.7 pg (ref 26.0–34.0)
MCHC: 34.3 g/dL (ref 30.0–36.0)
MCV: 98.2 fL (ref 78.0–100.0)
Platelets: 299 10*3/uL (ref 150–400)
RBC: 2.76 MIL/uL — AB (ref 4.22–5.81)
RDW: 13.4 % (ref 11.5–15.5)
WBC: 11.2 10*3/uL — ABNORMAL HIGH (ref 4.0–10.5)

## 2013-10-12 LAB — TSH: TSH: 2.36 u[IU]/mL (ref 0.350–4.500)

## 2013-10-12 MED ORDER — ACETAMINOPHEN 650 MG RE SUPP: 650.0000 mg | Freq: Four times a day (QID) | RECTAL | Status: DC | PRN

## 2013-10-12 MED ORDER — SENNOSIDES-DOCUSATE SODIUM 8.6-50 MG PO TABS: 1.0000 | ORAL_TABLET | Freq: Every evening | ORAL | Status: DC | PRN

## 2013-10-12 MED ORDER — MORPHINE SULFATE 2 MG/ML IJ SOLN: 1.0000 mg | INTRAMUSCULAR | Status: DC | PRN

## 2013-10-12 MED ORDER — CARBAMAZEPINE 200 MG PO TABS: 400.0000 mg | ORAL_TABLET | Freq: Three times a day (TID) | ORAL | Status: DC

## 2013-10-12 MED ORDER — CARBAMAZEPINE 200 MG PO TABS
600.0000 mg | ORAL_TABLET | Freq: Every day | ORAL | Status: DC
  Administered 2013-10-13: 600 mg via ORAL
  Administered 2013-10-14: 200 mg via ORAL
  Administered 2013-10-15 – 2013-10-17 (×2): 600 mg via ORAL
  Filled 2013-10-12 (×6): qty 3

## 2013-10-12 MED ORDER — CARBAMAZEPINE 200 MG PO TABS
400.0000 mg | ORAL_TABLET | ORAL | Status: DC
  Administered 2013-10-12 – 2013-10-16 (×8): 400 mg via ORAL
  Filled 2013-10-12 (×11): qty 2

## 2013-10-12 MED ORDER — ONDANSETRON HCL 4 MG/2ML IJ SOLN: 4.0000 mg | Freq: Four times a day (QID) | INTRAMUSCULAR | Status: DC | PRN

## 2013-10-12 MED ORDER — SODIUM CHLORIDE 0.9 % IV SOLN
INTRAVENOUS | Status: DC
  Administered 2013-10-12: 15:00:00 via INTRAVENOUS

## 2013-10-12 MED ORDER — EFAVIRENZ-EMTRICITAB-TENOFOVIR 600-200-300 MG PO TABS
1.0000 | ORAL_TABLET | Freq: Every day | ORAL | Status: DC
  Administered 2013-10-12 – 2013-10-16 (×5): 1 via ORAL
  Filled 2013-10-12 (×6): qty 1

## 2013-10-12 MED ORDER — MEGESTROL ACETATE 40 MG/ML PO SUSP
400.0000 mg | Freq: Every morning | ORAL | Status: DC
  Administered 2013-10-13 – 2013-10-15 (×3): 400 mg via ORAL
  Filled 2013-10-12 (×6): qty 10

## 2013-10-12 MED ORDER — OXYCODONE HCL 5 MG PO TABS: 5.0000 mg | ORAL_TABLET | ORAL | Status: DC | PRN

## 2013-10-12 MED ORDER — OXYCODONE HCL ER 15 MG PO T12A
15.0000 mg | EXTENDED_RELEASE_TABLET | Freq: Two times a day (BID) | ORAL | Status: DC
  Administered 2013-10-13: 15 mg via ORAL
  Filled 2013-10-12: qty 1

## 2013-10-12 MED ORDER — GABAPENTIN 300 MG PO CAPS
300.0000 mg | ORAL_CAPSULE | Freq: Three times a day (TID) | ORAL | Status: DC
  Administered 2013-10-12 – 2013-10-13 (×4): 300 mg via ORAL
  Filled 2013-10-12 (×7): qty 1

## 2013-10-12 MED ORDER — ENOXAPARIN SODIUM 40 MG/0.4ML ~~LOC~~ SOLN
40.0000 mg | SUBCUTANEOUS | Status: DC
  Administered 2013-10-12 – 2013-10-15 (×4): 40 mg via SUBCUTANEOUS
  Filled 2013-10-12 (×6): qty 0.4

## 2013-10-12 MED ORDER — PHENOBARBITAL 32.4 MG PO TABS
64.8000 mg | ORAL_TABLET | Freq: Every day | ORAL | Status: DC
  Administered 2013-10-12 – 2013-10-16 (×5): 64.8 mg via ORAL
  Filled 2013-10-12 (×5): qty 2

## 2013-10-12 MED ORDER — ONDANSETRON HCL 4 MG PO TABS: 4.0000 mg | ORAL_TABLET | Freq: Four times a day (QID) | ORAL | Status: DC | PRN

## 2013-10-12 MED ORDER — ACETAMINOPHEN 325 MG PO TABS
650.0000 mg | ORAL_TABLET | Freq: Four times a day (QID) | ORAL | Status: DC | PRN
  Administered 2013-10-13: 650 mg via ORAL
  Filled 2013-10-12: qty 2

## 2013-10-12 NOTE — H&P (Signed)
[facility]          History and Physical    PCP:   Bobby Rumpf, MD   Chief Complaint:  Generalized weakness, dehydration, FTT  HPI: Patient is a 58 y/o man with multiple medical co-morbidities including HIV, seizure disorder, small cell lung cancer followed by Dr. Julien Nordmann and a SCC of the left posterior thigh followed by Dr. Lucia Gaskins. Was in the ED yesterday for FTT and was discharged home with [facility] therapies. Presents to see Dr. Julien Nordmann today where he was found to be clinically dehydrated and that has prompted a direct admission to the hospital. Wife states they are no longer able to care for him at home and would like to consider SNF placement. Hospitalist admission has been requested.  Allergies:  No Known Allergies    Past Medical History  Diagnosis Date  . HIV infection dx'd 2008  . SOB (shortness of breath)   . Chronic pain in right shoulder     scapula and arm for 2 months   . Anxiety   . Protein-calorie malnutrition   . Tegretol toxicity     history of  . 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 Grand mal seizure 03/04/12  . Epileptic seizures     "I've had them all my life" (08/24/2013)    Past Surgical History  Procedure Laterality Date  . Fine needle aspiration    . Skin cancer excision Left 08/24/2013    wide excision cancer thigh/buttocks  . Wound debridement Left 06/24/2013    posterior thigh/notes 06/24/2013  . Melanoma excision Left 08/24/2013    Procedure: wide excision cancer of left buttock / leg ;  Surgeon: Shann Medal, MD;  Location: [facility];  Service: General;  Laterality: Left;    Prior to Admission medications   Medication Sig Start Date End Date Taking? Authorizing [place] Winsor  carbamazepine (TEGRETOL) 200 MG tablet Take 400-600 mg by mouth 3 (three) times daily. Take 3 tabs (600mg ) in the morning, 2 tabs (400mg ) at noon, and 2 tabs (400mg ) at  bedtime.   Yes Historical Fairy Ashlock, MD  efavirenz-emtricitabine-tenofovir (ATRIPLA) 600-200-300 MG per tablet Take 1 tablet by mouth at bedtime.   Yes Historical Armarion Greek, MD  gabapentin (NEURONTIN) 300 MG capsule Take 300 mg by mouth 3 (three) times daily.   Yes Historical Lam Mccubbins, MD  megestrol (MEGACE) 40 MG/ML suspension Take 400 mg by mouth every morning.   Yes Historical Nariah Morgano, MD  oxyCODONE (OXY IR/ROXICODONE) 5 MG immediate release tablet Take 5 mg by mouth every 4 (four) hours as needed for severe pain.   Yes Historical Lamario Mani, MD  OxyCODONE (OXYCONTIN) 15 mg T12A 12 hr tablet Take 15 mg by mouth every 12 (twelve) hours.   Yes Historical Saide Lanuza, MD  PHENobarbital (LUMINAL) 64.8 MG tablet Take 64.8 mg by mouth at bedtime.   Yes Historical Jacques Willingham, MD    Social History:  reports that he has quit smoking. His smoking use included Cigarettes. He has a 43 pack-year smoking history. He has never used smokeless tobacco. He reports that he drinks about 3.6 ounces of alcohol per week. He reports that he uses illicit drugs (Marijuana).  Family History  Problem Relation Age of Onset  . Colon cancer Neg Hx     Review of Systems:  Unable to obtain given current  mental states.  Physical Exam: Blood pressure 108/74, pulse 103, temperature 99.2 F (37.3 C), temperature source Oral, resp. rate 20, weight 52.4 kg (115 lb 8.3 oz), SpO2 96.00%. Gen: opens eyes to voice, does not volunteer to answer any questions. HEENT: [place]/AT/very dry mucous membranes with cracked lips and tongue. Neck: no JVD, no LAD, no bruits, no goiter. CV: RRR Lungs: CTA B Abd: S/NT/ND/+BS Ext: no C/C/E Neuro: moves all 4 spontaneously  Labs on Admission:  No results found for this or any previous visit (from the past 48 hour(s)).  Radiological Exams on Admission: No results found.  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 -Check CBC/CMET. -Check B12/TSH/RPR. -PT/OT evals. -If possible, wife would like to consider SNF placement. -Will request SW consultation.  HIV -Continue antiviral meds. [place])  Seizure meds -Continue phenobarbital.  Lung Cancer/SCC of left leg -As per oncology. Dr. Julien Nordmann has been added to [facility]. -Continue radiation as scheduled.  Severe Protein-Caloric Malnutrition -Dietitian consult.  DVT Prophylaxis -Lovenox  Code Status -Full Code   Time Spent on Admission: 65 minutes  HERNANDEZ ACOSTA,ESTELA [facility] Pager: [phone] 10/10/2013, 2:47 PM

## 2013-10-12 NOTE — Progress Notes (Addendum)
Pt soaked the bed, after being cleaned up, pt stated "I want this thing out of my arm". This RN attempted to call pt's wife to see if she could convince him to leave it in. No answer on both phone numbers. This RN removed pt's IV per pt request, since pt alert and oriented x 4. This RN to speak with wife in the AM when she returns about restarting IV. Noreene Larsson RN, BSN  Baltazar Najjar NP on call notified. Noreene Larsson RN, BSN

## 2013-10-12 NOTE — Telephone Encounter (Signed)
Pt's daughter left a message stating that she cannot bring pt to his appointment because he cannot move.  She states she does not feel he should be living at home.  Called her back and left a message tat we can r/s his appt to another day and to call back so we can discuss further.  SLJ

## 2013-10-12 NOTE — Progress Notes (Signed)
10/02/2013 A. Ferrero RNCM 1741pm EDCM called and left message [place] for [facility] to inform her of patient's admission.  EDCM also spoke to patient's wifewho confirms she has received bedside commode.  No further EDCM needs at this time.

## 2013-10-12 NOTE — Progress Notes (Signed)
Noted Chevez Sambrano was discharged home from the ED last night via ambulance. Spoke with Mackie Pai, RN this morning of medical oncology. She is attempting to get the patient into the office to review scans. Discussed [facility] and that Nate could provide PPE necessary for visit since bed bugs have been found on the patient. Received called from [place], LCSW to discuss issue at hand. Came to the resolve that the patient's  daughter would bring her father in for evaluation with [facility]. Received call from Neta Mends, upon patient's arrival in medical oncology. Dr. Julien Nordmann plan to admit patient. Explained to patient, his family and Lauren if patient was admitted we could bring him from his room for treatment at 5 pm tonight. Present while Dr. Julien Nordmann discussed with family options. Final decision was to attempt to admit patient, move forward with radiation treatment, hold chemo and call in hospice once radiation treatment is complete.

## 2013-10-12 NOTE — Progress Notes (Signed)
[place] [phone]   [phone]  OFFICE PROGRESS NOTE  Bobby Rumpf, MD [address] [place] [address] [place] [address]  DIAGNOSIS: Metastatic non-small cell lung cancer, squamous cell carcinoma initially diagnosed asStage IIB/IIIA  presenting as a right Pancoast tumor diagnosed in March of 2014.  PRIOR THERAPY:   1) Concurrent chemoradiation with weekly carboplatin for AUC of 2 and paclitaxol 45 mg/M2, status post 5 weekly doses. Last dose was given on 08/24/2012 with no significant response.  2) Systemic chemotherapy with carboplatin for AUC of 5 on day 1 and gemcitabine 1000 mg/M2 on days 1 and 8 every 3 weeks. First dose given on 10/28/2012. Status post day 1 of cycle 1, day 8 held secondary to neutropenia. Also status post day 1 of cycle 2. 3) status post stereotactic radiotherapy to enlarging right lung nodules under the care of Dr. Tammi Klippel.  CURRENT THERAPY: Palliative radiotherapy to the enlarged inguinal lymphadenopathy under the care of Dr. Tammi Klippel.  INTERVAL HISTORY: Roger Walton 58 y.o. male returns to the clinic today for followup visit accompanied by his wife and daughter. The patient recently underwent CT simulation for palliative radiotherapy to the painful enlarged inguinal lymphadenopathy. He came in a wheelchair and it was very difficult for his family to bring him to the clinic today. He continues to have significant fatigue and weakness. He also has shortness of breath at baseline and increased with exertion. He continues to have significant weight loss. The patient had recent CT scan of the chest, abdomen and pelvis and he is here for evaluation and discussion of his scan results and treatment options. He was hypotensive and has low-grade fever today when he presented to the clinic. Her social situation is also very poor and his house is invested with bed bugs.  MEDICAL HISTORY: Past Medical History  Diagnosis Date  .  HIV infection dx'd 2008  . SOB (shortness of breath)   . Chronic pain in right shoulder     scapula and arm for 2 months   . Anxiety   . Protein-calorie malnutrition   . Tegretol toxicity     history of  . 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 Grand mal seizure 03/04/12  . Epileptic seizures     "I've had them all my life" (08/24/2013)    ALLERGIES:  has No Known Allergies.  MEDICATIONS:  Current Outpatient Prescriptions  Medication Sig Dispense Refill  . carbamazepine (TEGRETOL) 200 MG tablet take 3 tablets by mouth every morning 2 AT NOON and 2 tablets by mouth at bedtime  720 tablet  2  . efavirenz-emtricitabine-tenofovir (ATRIPLA) 600-200-300 MG per tablet Take 1 tablet by mouth at bedtime.  30 tablet  11  . gabapentin (NEURONTIN) 300 MG capsule Take 1 capsule (300 mg total) by mouth 3 (three) times daily.  90 capsule  5  . megestrol (MEGACE) 40 MG/ML suspension take 10 milliliters (2 teaspoonfuls) by mouth once daily  240 mL  2  . oxyCODONE (OXY IR/ROXICODONE) 5 MG immediate release tablet Take 1 tablet (5 mg total) by mouth every 4 (four) hours as needed for severe pain.  12 tablet  0  . OxyCODONE (OXYCONTIN) 15 mg T12A 12 hr tablet Take 1 tablet (15 mg total) by mouth every 12 (twelve) hours.  60 tablet  0  . PHENobarbital (LUMINAL) 64.8 MG tablet  Take 1 tablet (64.8 mg total) by mouth at bedtime.  90 tablet  2   No current facility-administered medications for this visit.    REVIEW OF SYSTEMS:  Constitutional: positive for anorexia, fatigue and weight loss Eyes: negative Ears, nose, mouth, throat, and face: negative Respiratory: positive for dyspnea on exertion Cardiovascular: negative Gastrointestinal: negative Genitourinary:negative Integument/breast: negative Hematologic/lymphatic: negative Musculoskeletal:positive for bone pain and muscle weakness Neurological:  negative Behavioral/Psych: negative Endocrine: negative Allergic/Immunologic: negative   PHYSICAL EXAMINATION: General appearance: alert, cooperative and no distress Head: Normocephalic, without obvious abnormality, atraumatic Neck: no adenopathy Lymph nodes: Cervical, supraclavicular, and axillary nodes normal. Resp: clear to auscultation bilaterally Cardio: regular rate and rhythm, S1, S2 normal, no murmur, click, rub or gallop GI: soft, non-tender; bowel sounds normal; no masses,  no organomegaly Extremities: extremities normal, atraumatic, no cyanosis or edema Neurologic: Alert and oriented X 3, normal strength and tone. Normal symmetric reflexes. Normal coordination and gait    ECOG PERFORMANCE STATUS: 2 - Symptomatic, <50% confined to bed  Blood pressure 89/60, pulse 119, temperature 100 F (37.8 C), temperature source Oral.  LABORATORY DATA: Lab Results  Component Value Date   WBC 5.3 09/26/2013   HGB 13.3 09/26/2013   HCT 38.5* 09/26/2013   MCV 98.2 09/26/2013   PLT 213 09/26/2013      Chemistry      Component Value Date/Time   NA 143 09/26/2013 1820   NA 132* 08/09/2013 1340   K 3.6* 09/26/2013 1820   K 4.2 08/09/2013 1340   CL 107 09/26/2013 1820   CL 103 08/24/2012 1129   CO2 24 09/26/2013 1820   CO2 23 08/09/2013 1340   BUN 18 09/26/2013 1820   BUN 10.0 08/09/2013 1340   CREATININE 0.82 09/26/2013 1820   CREATININE 0.9 08/09/2013 1340   CREATININE 0.95 05/05/2013 1530      Component Value Date/Time   CALCIUM 11.8* 09/26/2013 1820   CALCIUM 9.9 08/09/2013 1340   ALKPHOS 84 08/20/2013 2147   ALKPHOS 91 08/09/2013 1340   AST 20 08/20/2013 2147   AST 20 08/09/2013 1340   ALT 13 08/20/2013 2147   ALT 10 08/09/2013 1340   BILITOT <0.2* 08/20/2013 2147   BILITOT 0.30 08/09/2013 1340       RADIOGRAPHIC STUDIES: Dg Chest 2 View  09/26/2013   CLINICAL DATA:  Productive cough  EXAM: CHEST  2 VIEW  COMPARISON:  Aug 20, 2013 chest radiograph and chest CT April 05, 2013  FINDINGS: There is  underlying emphysematous change. No edema or consolidation. The previously noted mass in the right lower lobe seen on prior CT is not seen on radiographic examination. The currently there is no edema or consolidation. There is some fibrotic change in the lung bases, however. Heart size and pulmonary vascularity are normal. No adenopathy. No bone lesions are appreciable.  IMPRESSION: Underlying emphysematous change. No edema or consolidation. The previously noted mass in the right lower lobe is not seen on the radiographic examination. If evaluation of this mass specifically is felt to be indicated at this time, chest CT would be the imaging study of choice for further assessment.   Electronically Signed   By: Lowella Grip M.D.   On: 09/26/2013 19:21   Dg Sacrum/coccyx  09/26/2013   CLINICAL DATA:  pain  EXAM: SACRUM AND COCCYX - 2+ VIEW  COMPARISON:  None.  FINDINGS: Frontal and lateral views were obtained. There is no fracture or diastases. No blastic or lytic bone lesion. No fracture  or dislocation. Joint spaces appear intact.  IMPRESSION: No abnormality noted.   Electronically Signed   By: Lowella Grip M.D.   On: 09/26/2013 19:29   Ct Head W Wo Contrast  10/01/2013   CLINICAL DATA:  58 year old male with HIV and right upper lobe lung cancer. Staging. Subsequent encounter.  EXAM: CT HEAD WITHOUT AND WITH CONTRAST  TECHNIQUE: Contiguous axial images were obtained from the base of the skull through the vertex without and with intravenous contrast  CONTRAST:  140mL OMNIPAQUE IOHEXOL 300 MG/ML SOLN in conjunction with contrast enhanced imaging of the chest, abdomen, and pelvis reported separately.  COMPARISON:  Brain MRI 08/21/2013. Head CT without contrast 08/20/2013.  FINDINGS: Visualized paranasal sinuses and mastoids are clear. No acute osseous abnormality identified. Pincer orbits and scalp  Stable cerebral volume. No midline shift, ventriculomegaly, mass effect, evidence of mass lesion,  intracranial hemorrhage or evidence of cortically based acute infarction. Gray-white matter differentiation is within normal limits throughout the brain. No abnormal enhancement identified. Major visualized intracranial vascular structures appear to be normally enhancing.  IMPRESSION: Stable and normal for age CT appearance of the brain.   Electronically Signed   By: Lars Pinks M.D.   On: 10/01/2013 17:34   Ct Chest W Contrast  10/01/2013   CLINICAL DATA:  Followup metastatic lung carcinoma. Completed chemotherapy. HIV. Shortness of breath.  EXAM: CT CHEST, ABDOMEN, AND PELVIS WITH CONTRAST  TECHNIQUE: Multidetector CT imaging of the chest, abdomen and pelvis was performed following the standard protocol during bolus administration of intravenous contrast.  CONTRAST:  178mL OMNIPAQUE IOHEXOL 300 MG/ML  SOLN  COMPARISON:  AP CT on 06/25/2013 and CAP CT on 04/05/2013.  FINDINGS: CT CHEST FINDINGS  Soft tissue opacity in the medial right lung apex measures 2.4 x 2.7 cm on image 9, and is not significant changed since previous study when it measured 2.5 x 2.7 cm. Adjacent soft tissue density in the right apical chest wall soft tissues is also stable. No mediastinal or hilar lymphadenopathy identified.  Secretions are seen within the mainstem bronchi bilaterally, suspicious for aspiration. There is mild patchy airspace disease seen in the superior segments of the lower lobes bilaterally, suspicious for aspiration pneumonitis.  Nodule in the superior segment right lower lobe measures 10 x 11 mm on image 31 compared to 18 x 18 mm previously. Other tiny less than 5 mm nodular densities in the lateral right lower lobea on image 49 in the right middle lobe on image 36 remains stable. No new or enlarging pulmonary nodules or masses are seen. No evidence of pleural or pericardial effusion. No suspicious bone lesions identified.  CT ABDOMEN AND PELVIS FINDINGS  Low-attenuation splenic lesion is decreased in size is no longer  well visualized on today's study. No evidence of splenomegaly.  Small low-attenuation nodule in the right adrenal gland is stable. The liver, gallbladder, pancreas, and kidneys are normal in appearance. No evidence of hydronephrosis.  A low attenuation mass with enhancing rim in the left psoas muscle measures 2.0 x 2.0 cm on image 67 and appears new since prior exam. This is consistent with a soft tissue metastasis. Similar new low-attenuation soft tissue mass measuring 2.8 x 3.3 cm is seen in the left rectus muscle on image 88. A new rim enhancing soft tissue mass measuring 1.9 cm is seen in the muscles of the left posterior thigh on image 120, consistent with soft tissue metastasis. Another low-attenuation soft tissue mass in the subcutaneous tissues of the inferior  left buttock on image 125 measures 2.8 cm and shows no significant change compared to prior exam.  Necrotic lymphadenopathy or soft tissue metastasis in the left inguinal and femoral regions has significantly increased in size, largest measuring 4.4 x 5.2 cm on image 114 compared to 1.4 x 2.1 cm previously.  No evidence of inflammatory process, abscess, or bowel obstruction. No suspicious bone lesions identified.  IMPRESSION: Stable soft tissue mass involving the right lung apex and chest wall soft tissues.  Decreased size of right lower lobe pulmonary nodule. Decreased size of splenic metastasis.  Increased soft tissue metastatic disease in the left abdominal psoas and rectus abdominus muscles, and proximal left posterior thigh muscles.  Increased necrotic lymphadenopathy or soft tissue metastases in the left inguinal and femoral regions.   Electronically Signed   By: Earle Gell M.D.   On: 10/01/2013 17:37    ASSESSMENT AND PLAN: The patient is a very pleasant 58 years old [place] American male with Metastatic non-small cell lung cancer initially diagnosed as stage IIIA status post a course of concurrent chemoradiation followed by 2 cycles of  chemotherapy was carboplatin and gemcitabine discontinued secondary to intolerance and the patient also received stereotactic radiotherapy to her enlarging right lung nodules. He has been observation for the last few months but the patient developed posterior left lower extremity wound and the debridement showed metastatic squamous cell carcinoma. He recently underwent wide excision of the metastatic mass and ulcer under the care of Dr. Lucia Gaskins and the wound is healing much better. His recent CT scan of the chest, abdomen and pelvis showed stable disease in the chest but there was significant disease progression in the left abdominal psoas and rectus abdominis muscle as well as the proximal left posterior thigh muscles and increased necrotic lymphadenopathy in the left inguinal and femoral regions. I had a lengthy discussion with the patient and his family today about his current disease status and treatment options. I recommended for the patient to continue his palliative radiotherapy as recommended by Dr. Tammi Klippel. I don't think the patient would be a good candidate for systemic chemotherapy given the deterioration in his general condition as well as his HIV. I recommended for the patient consideration of palliative care and hospice. The family had a hard time taking care of the patient at home and also difficulty with transportation to his radiation treatment. He is also hypotensive and has low-grade fever today. I recommended for the patient to be admitted to [facility] for evaluation of his condition and treatment of hypotension as well as making arrangements for skilled nursing facility and transportation to his radiation treatment. I spent more than 30 minutes with the patient and his family was more than 50% of the time on counseling. He was also advised to call immediately if he has any concerning symptoms in the interval.   Disclaimer: This note was dictated with voice recognition  software. Similar sounding words can inadvertently be transcribed and may not be corrected upon review.  Eilleen Kempf., MD 10/06/2013

## 2013-10-12 NOTE — Progress Notes (Signed)
[facility] Therapy Treatment Record Phone [phone]   Radiation Therapy was administered to Roger Walton on: 10/03/2013  6:14 PM and was treatment # 1 out of a planned course of 6 treatments.

## 2013-10-13 ENCOUNTER — Inpatient Hospital Stay (HOSPITAL_COMMUNITY): Payer: Medicare Other

## 2013-10-13 ENCOUNTER — Ambulatory Visit
Admission: RE | Admit: 2013-10-13 | Discharge: 2013-10-13 | Disposition: A | Discharge status: Home / Self Care | Payer: Medicare Other | Source: Ambulatory Visit | Attending: Radiation Oncology | Admitting: Radiation Oncology

## 2013-10-13 ENCOUNTER — Ambulatory Visit: Payer: Medicare Other

## 2013-10-13 DIAGNOSIS — G9340 Encephalopathy, unspecified: Secondary | ICD-10-CM

## 2013-10-13 LAB — CBC
HCT: 23.6 % — ABNORMAL LOW (ref 39.0–52.0)
HEMATOCRIT: 31.8 % — AB (ref 39.0–52.0)
Hemoglobin: 10.8 g/dL — ABNORMAL LOW (ref 13.0–17.0)
Hemoglobin: 8.1 g/dL — ABNORMAL LOW (ref 13.0–17.0)
MCH: 33.2 pg (ref 26.0–34.0)
MCH: 34 pg (ref 26.0–34.0)
MCHC: 34 g/dL (ref 30.0–36.0)
MCHC: 34.3 g/dL (ref 30.0–36.0)
MCV: 97.8 fL (ref 78.0–100.0)
MCV: 99.2 fL (ref 78.0–100.0)
Platelets: 246 10*3/uL (ref 150–400)
Platelets: 322 10*3/uL (ref 150–400)
RBC: 3.25 MIL/uL — ABNORMAL LOW (ref 4.22–5.81)
RBC: 2.38 MIL/uL — ABNORMAL LOW (ref 4.22–5.81)
RDW: 13.4 % (ref 11.5–15.5)
RDW: 13.7 % (ref 11.5–15.5)
WBC: 6.2 10*3/uL (ref 4.0–10.5)
WBC: 8.2 10*3/uL (ref 4.0–10.5)

## 2013-10-13 LAB — BASIC METABOLIC PANEL
BUN: 16 mg/dL (ref 6–23)
BUN: 17 mg/dL (ref 6–23)
CALCIUM: 11.2 mg/dL — AB (ref 8.4–10.5)
CHLORIDE: 105 meq/L (ref 96–112)
CO2: 26 meq/L (ref 19–32)
CO2: 28 mEq/L (ref 19–32)
CREATININE: 0.88 mg/dL (ref 0.50–1.35)
Calcium: 11.2 mg/dL — ABNORMAL HIGH (ref 8.4–10.5)
Chloride: 106 mEq/L (ref 96–112)
Creatinine, Ser: 0.88 mg/dL (ref 0.50–1.35)
GFR calc Af Amer: >90 mL/min (ref >90)
GFR calc non Af Amer: >90 mL/min (ref >90)
Glucose, Bld: 105 mg/dL — ABNORMAL HIGH (ref 70–99)
Glucose, Bld: 126 mg/dL — ABNORMAL HIGH (ref 70–99)
Potassium: 3.3 mEq/L — ABNORMAL LOW (ref 3.7–5.3)
Potassium: 3.4 mEq/L — ABNORMAL LOW (ref 3.7–5.3)
Sodium: 144 mEq/L (ref 137–147)
Sodium: 145 mEq/L (ref 137–147)

## 2013-10-13 LAB — BLOOD GAS, ARTERIAL
ACID-BASE EXCESS: 2.8 mmol/L — AB (ref 0.0–2.0)
BICARBONATE: 26.9 meq/L — AB (ref 20.0–24.0)
Drawn by: 331471
O2 Saturation: 84 %
PCO2 ART: 41.4 mmHg (ref 35.0–45.0)
PO2 ART: 51.3 mmHg — AB (ref 80.0–100.0)
Patient temperature: 98.6
TCO2: 24.5 mmol/L (ref 0–100)
pH, Arterial: 7.429 (ref 7.350–7.450)

## 2013-10-13 LAB — LACTIC ACID, PLASMA: Lactic Acid, Venous: 1 mmol/L (ref 0.5–2.2)

## 2013-10-13 LAB — RPR

## 2013-10-13 LAB — GLUCOSE, CAPILLARY: Glucose-Capillary: 139 mg/dL — ABNORMAL HIGH (ref 70–99)

## 2013-10-13 LAB — AMMONIA: Ammonia: 21 umol/L (ref 11–60)

## 2013-10-13 MED ORDER — SODIUM CHLORIDE 0.9 % IV SOLN
3.0000 g | INTRAVENOUS | Status: AC
  Administered 2013-10-13: 3 g via INTRAVENOUS
  Filled 2013-10-13: qty 3

## 2013-10-13 MED ORDER — AZITHROMYCIN 250 MG PO TABS
500.0000 mg | ORAL_TABLET | Freq: Every day | ORAL | Status: DC
  Administered 2013-10-13: 500 mg via ORAL
  Filled 2013-10-13: qty 2

## 2013-10-13 MED ORDER — POTASSIUM CHLORIDE 10 MEQ/100ML IV SOLN
10.0000 meq | INTRAVENOUS | Status: AC
  Administered 2013-10-13 (×3): 10 meq via INTRAVENOUS
  Filled 2013-10-13 (×6): qty 100

## 2013-10-13 MED ORDER — BOOST PLUS PO LIQD
237.0000 mL | Freq: Three times a day (TID) | ORAL | Status: DC
  Administered 2013-10-13 – 2013-10-14 (×2): 237 mL via ORAL
  Filled 2013-10-13 (×5): qty 237

## 2013-10-13 MED ORDER — SODIUM CHLORIDE 0.9 % IV BOLUS (SEPSIS)
500.0000 mL | Freq: Once | INTRAVENOUS | Status: AC
  Administered 2013-10-13: 500 mL via INTRAVENOUS

## 2013-10-13 MED ORDER — AMPICILLIN-SULBACTAM SODIUM 3 (2-1) G IJ SOLR
3.0000 g | Freq: Four times a day (QID) | INTRAMUSCULAR | Status: DC
  Administered 2013-10-14 – 2013-10-17 (×14): 3 g via INTRAVENOUS
  Filled 2013-10-13 (×15): qty 3

## 2013-10-13 MED ORDER — SODIUM CHLORIDE 0.45 % IV SOLN
INTRAVENOUS | Status: DC
  Administered 2013-10-13 – 2013-10-14 (×3): via INTRAVENOUS
  Administered 2013-10-16: 1000 mL via INTRAVENOUS

## 2013-10-13 NOTE — Progress Notes (Signed)
CSW received referral for New SNF.   CSW spoke with RN who reports that pt transferred down to Step Down Unit.   CSW to follow up with pt and pt wife tomorrow regarding disposition planning.   CSW to complete full psychosocial assessment at that time.  Alison Murray, MSW, [place] [facility] [phone]

## 2013-10-13 NOTE — Progress Notes (Signed)
ANTIBIOTIC CONSULT NOTE - INITIAL  Pharmacy Consult for:  Unasyn Indication:  Pneumonia, suspect aspiration  No Known Allergies  Patient Measurements: Height: 6\' 1"  (185.4 cm) Weight: 120 lb 5.9 oz (54.6 kg) IBW/kg (Calculated) : 79.9   Vital Signs: Temp: 97.8 F (36.6 C) (06/24 1600) Temp src: Oral (06/24 1600) BP: 124/67 mmHg (06/24 1800) Pulse Rate: 78 (06/24 1800) Intake/Output from previous day: 06/23 0701 - 06/24 0700 In: 240 [P.O.:240] Out: -  Intake/Output from this shift: Total I/O In: 600 [I.V.:300; IV Piggyback:300] Out: -   Labs:  Recent Labs  09/21/2013 1605 10/13/13 0407 10/13/13 1145  WBC 11.2* 6.2 8.2  HGB 9.3* 10.8* 8.1*  PLT 299 246 322  CREATININE 0.92 0.88 0.88   Estimated Creatinine Clearance: 68.9 ml/min (by C-G formula based on Cr of 0.88).    Microbiology: Recent Results (from the past 720 hour(s))  MRSA PCR SCREENING     Status: None   Collection Time    10/10/2013  7:00 PM      Result Value Ref Range Status   MRSA by PCR NEGATIVE  NEGATIVE Final   Comment:            The GeneXpert MRSA Assay (FDA     approved for NASAL specimens     only), is one component of a     comprehensive MRSA colonization     surveillance program. It is not     intended to diagnose MRSA     infection nor to guide or     monitor treatment for     MRSA infections.    Medical History: Past Medical History  Diagnosis Date  . HIV infection dx'd 2008  . SOB (shortness of breath)   . Chronic pain in right shoulder     scapula and arm for 2 months   . Anxiety   . Protein-calorie malnutrition   . Tegretol toxicity     history of  . 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 Grand mal seizure 03/04/12  . Epileptic seizures     "I've had them all my life" (08/24/2013)    Medications:  Scheduled:  . ampicillin-sulbactam (UNASYN) IV  3 g Intravenous STAT  . carbamazepine   400 mg Oral 2 times per day  . carbamazepine  600 mg Oral Q breakfast  . efavirenz-emtricitabine-tenofovir  1 tablet Oral QHS  . enoxaparin (LOVENOX) injection  40 mg Subcutaneous Q24H  . gabapentin  300 mg Oral TID  . lactose free nutrition  237 mL Oral TID WC  . megestrol  400 mg Oral q morning - 10a  . PHENobarbital  64.8 mg Oral QHS   Assessment:  Asked to assist with Unasyn therapy for this 58 year-old male with pneumonia, and suspected aspiration.  Under the care of Dr. Julien Nordmann for metastatic non-small cell lung cancer.  Receiving palliative radiotherapy to enlarged inguinal lymphadenopathy.  Not a candidate for systemic chemotherapy.  Urine culture is pending.  Goal of Therapy:  Eradication of infection  Plan:  Unasyn 3 grams IV every 6 hours.  [place] SalinePh. 10/13/2013 8:20 PM

## 2013-10-13 NOTE — Progress Notes (Signed)
[facility] PROGRESS NOTE  [place] CURRENT QVZ:[id number] DOB: [birth year] DOA: 09/28/2013 PCP: Bobby Rumpf, MD  Assessment/Plan:  Encephalopathy: AMS.  Differential secondary to pain medication vs seizure.  CT head stat negative for bleed. Holding pain medications.  ABG with no hypercapnia.  Neurology consulted.   Adult FTT/Generalized Weakness/Dehydration  -Admit for rehydration purposes.  - B-12 644/TSH 2.3/RPR negative.  -PT/OT evals.   HIV  -Continue antiviral meds. [place])   Seizure meds  -Continue phenobarbital.   Lung Cancer/SCC of left leg  -Follow with Dr Julien Nordmann.  -on  Radiation.   Severe Protein-Caloric Malnutrition  -Dietitian consult.  -Boost plus.   DVT Prophylaxis  -Lovenox   Code Status  -Full Code   Code Status: full code.  Family Communication: care discussed with wife.  Disposition Plan: Transfer to step down for close monitoring. Palliative care consult for Goals of care.    Consultants:  Neurology  Palliative care.   Dr Julien Nordmann.   Procedures:  none  Antibiotics:  none  HPI/Subjective: Called by nurse, patient notice to be with decrease alertness, not following command,  Lethargic.   Objective: Filed Vitals:   10/13/13 1300  BP: 97/71  Pulse: 82  Temp:   Resp: 14    Intake/Output Summary (Last 24 hours) at 10/13/13 1319 Last data filed at 10/13/13 1300  Gross per 24 hour  Intake    315 ml  Output      0 ml  Net    315 ml   Filed Weights   10/08/2013 1259 10/13/13 1230  Weight: 52.4 kg (115 lb 8.3 oz) 54.6 kg (120 lb 5.9 oz)    Exam:   General:  Lethargic, open eyes.   Cardiovascular: S 1, S 2 RRR  Respiratory: Bilateral ronchus.   Abdomen: BS present, soft, NT  Musculoskeletal: no edema.   Neuro: Lethargic, open eyes, initially he was not following command, generalized weakness,   Data Reviewed: Basic Metabolic Panel:  Recent Labs Lab 10/15/2013 1605 10/13/13 0407 10/13/13 1145  NA 143  144 145  K 3.5* 3.3* 3.4*  CL 104 105 106  CO2 26 26 28   GLUCOSE 116* 105* 126*  BUN 17 16 17   CREATININE 0.92 0.88 0.88  CALCIUM 11.4* 11.2* 11.2*   Liver Function Tests:  Recent Labs Lab 09/22/2013 1605  AST 15  ALT 7  ALKPHOS 87  BILITOT 0.3  PROT 7.3  ALBUMIN 2.4*   No results found for this basename: LIPASE, AMYLASE,  in the last 168 hours  Recent Labs Lab 10/13/13 1144  AMMONIA 21   CBC:  Recent Labs Lab 09/23/2013 1605 10/13/13 0407 10/13/13 1145  WBC 11.2* 6.2 8.2  HGB 9.3* 10.8* 8.1*  HCT 27.1* 31.8* 23.6*  MCV 98.2 97.8 99.2  PLT 299 246 322   Cardiac Enzymes: No results found for this basename: CKTOTAL, CKMB, CKMBINDEX, TROPONINI,  in the last 168 hours BNP (last 3 results) No results found for this basename: PROBNP,  in the last 8760 hours CBG:  Recent Labs Lab 10/13/13 1126  GLUCAP 139*    Recent Results (from the past 240 hour(s))  MRSA PCR SCREENING     Status: None   Collection Time    10/14/2013  7:00 PM      Result Value Ref Range Status   MRSA by PCR NEGATIVE  NEGATIVE Final   Comment:            The GeneXpert MRSA Assay (FDA     approved  for NASAL specimens     only), is one component of a     comprehensive MRSA colonization     surveillance program. It is not     intended to diagnose MRSA     infection nor to guide or     monitor treatment for     MRSA infections.     Studies: Ct Head Wo Contrast  10/13/2013   CLINICAL DATA:  Altered mental status. Scleral cell carcinoma. HIV.  EXAM: CT HEAD WITHOUT CONTRAST  TECHNIQUE: Contiguous axial images were obtained from the base of the skull through the vertex without intravenous contrast.  COMPARISON:  08/30/2013  FINDINGS: No acute intracranial abnormality. Specifically, no hemorrhage, hydrocephalus, mass lesion, acute infarction, or significant intracranial injury. No acute calvarial abnormality. Visualized paranasal sinuses and mastoids clear. Orbital soft tissues unremarkable.   IMPRESSION: No acute intracranial abnormality.   Electronically Signed   By: Rolm Baptise M.D.   On: 10/13/2013 12:32    Scheduled Meds: . carbamazepine  400 mg Oral 2 times per day  . carbamazepine  600 mg Oral Q breakfast  . efavirenz-emtricitabine-tenofovir  1 tablet Oral QHS  . enoxaparin (LOVENOX) injection  40 mg Subcutaneous Q24H  . gabapentin  300 mg Oral TID  . lactose free nutrition  237 mL Oral TID WC  . megestrol  400 mg Oral q morning - 10a  . PHENobarbital  64.8 mg Oral QHS  . potassium chloride  10 mEq Intravenous Q1 Hr x 3   Continuous Infusions: . 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     Niel Hummer A  [facility] Pager [phone]. If 7PM-7AM, please contact night-coverage at [email or web address], password [facility] 10/13/2013, 1:19 PM  LOS: 1 day

## 2013-10-13 NOTE — Progress Notes (Signed)
Spoke with Arta Bruce with infection control regarding patient being on contact precautions despite a negative MRSA swab.  Patient has a history of having bed bugs but does not currently present with any bites.  Patient has no personal belongings from home with him in the room.  Instructed that this is not an appropriate reason for contact precautions.  Reviewed bed bugs algorithm.  Contact precautions D/Cd.  Will continue to monitor.

## 2013-10-13 NOTE — Progress Notes (Addendum)
Patient Roger Walton      DOB: 05-08-1955      ITV:[id number]  Patient sleeping soundly wife Roger Walton and step daughter at bedside. Explained the purpose of my vist. Family states that his biological daughters should be present. Roger Walton states she will check with them and call the phone with a time.  Will check back with them in the am.  Roger L. Lovena Le, MD MBA The Palliative Medicine Team at [facility] Phone: [phone] Pager: [phone]

## 2013-10-13 NOTE — Progress Notes (Signed)
Patient has changed in [place], hand grips weak especially R hand. Vital signs checked , BP- 93/63,o2 sat 93 on RA at this time,HR 88,RR 20. Dr.Regalaldo notified.-Roger Raburn Emeterio Reeve RN

## 2013-10-13 NOTE — Progress Notes (Signed)
PT Cancellation Note  Patient Details Name: Roger Walton MRN: [id number] DOB: [birth year]   Cancelled Treatment:    Reason Eval/Treat Not Completed: Fatigue/lethargy limiting ability to participate--will hold PT/OT at this time and check back another day. Thanks.   Weston Anna, MPT Pager: [place] OTR/L [phone] 10/13/2013

## 2013-10-13 NOTE — Progress Notes (Signed)
[facility] Therapy Treatment Record Phone [phone]   Radiation Therapy was administered to Roger Walton on: 10/13/2013  5:40 PM and was treatment # 2 out of a planned course of 6 treatments.

## 2013-10-13 NOTE — Progress Notes (Signed)
Report given to Colletta Maryland RN at ICU, imparted important informations re: patient condition.- Sandie Ano RN

## 2013-10-13 NOTE — Progress Notes (Addendum)
INITIAL NUTRITION ASSESSMENT  DOCUMENTATION CODES Per approved criteria  -Severe malnutrition in the context of chronic illness -Underweight  Pt meets criteria for severe MALNUTRITION in the context of chronic illness as evidenced by PO intake < 75% est nutrition needs for > one month, 11.1% body weight loss in 3 months, severe muscle wasting and subcutaneous fat loss in multiple regions.   INTERVENTION: -Recommend Boost Plus TID, each supplement to provide 360 kcal, 14 gram protein -Provided feeding assistance, recommended pt continue to receive feeding assistance from nursing staff -Will continue to monitor  NUTRITION DIAGNOSIS: Inadequate oral intake related to decreased appetite/weakness as evidenced by PO intake <75%, 11.1% body weight loss in 3 months.   Goal: Pt to meet >/= 90% of their estimated nutrition needs    Monitor:  Total protein/energy, labs, weights  Reason for Assessment: MST/Underweight  58 y.o. male  Admitting Dx: Generalized weakness  ASSESSMENT: Patient is a 58 y/o man with multiple medical co-morbidities including HIV, seizure disorder, small cell lung cancer followed by Dr. Julien Nordmann and a SCC of the left posterior thigh followed by Dr. Lucia Gaskins. Was in the ED yesterday for FTT and was discharged home with [facility] therapies. Presents to see Dr. Julien Nordmann today where he was found to be clinically dehydrated and that has prompted a direct admission to the hospital  -Pt reported minimal PO intake, < one meal/day. Has been unable to prepare meals for self d/t weakness and lethargy -Has been on Boost supplement during previous admit in 08/2013. Will continue with supplement regimen -Endorsed an overall unintentional wt loss of 140 lbs. Was unable to quantify when weight loss occurred. Has experienced a 15 lbs weight loss (11.15 body weight loss in three months, severe for time) -Has had <25% PO intake, assisted pt in feeding assistance. Endorsed feelings of weakness and  unable to feed self.  -Pt concerned with lack of resources/assistance upon d/c, recommend SNF placement as RD concerned for nutritional status/ongoing weight loss if pt goes home w/out assistance -Pt with severe muscle wasting and subcutaneous fat loss in temporal, orbital, upper arm, clavicle, thigh and calf region  Height: Ht Readings from Last 1 Encounters:  10/13/13 6\' 1"  (1.854 m)    Weight: Wt Readings from Last 1 Encounters:  10/13/13 120 lb 5.9 oz (54.6 kg)    Ideal Body Weight: 184 lbs  % Ideal Body Weight: 65%  Wt Readings from Last 10 Encounters:  10/13/13 120 lb 5.9 oz (54.6 kg)  09/27/13 124 lb 6.4 oz (56.427 kg)  09/15/13 126 lb (57.153 kg)  08/24/13 138 lb (62.596 kg)  08/24/13 138 lb (62.596 kg)  08/21/13 128 lb 12 oz (58.4 kg)  08/17/13 138 lb (62.596 kg)  08/13/13 134 lb (60.782 kg)  08/11/13 138 lb (62.596 kg)  07/20/13 134 lb (60.782 kg)    Usual Body Weight: 260 lbs  % Usual Body Weight: 46%  BMI:  Body mass index is 15.88 kg/(m^2). Underweight  Estimated Nutritional Needs: Kcal: 1900-2100 Protein: 100-110 gram Fluid: >/=1900 ml/daily  Skin: WDL  Diet Order: General  EDUCATION NEEDS: -No education needs identified at this time   Intake/Output Summary (Last 24 hours) at 10/13/13 1752 Last data filed at 10/13/13 1700  Gross per 24 hour  Intake    665 ml  Output      0 ml  Net    665 ml    Last BM: 6/22   Labs:   Recent Labs Lab 10/14/2013 1605 10/13/13 0407 10/13/13 1145  NA 143 144 145  K 3.5* 3.3* 3.4*  CL 104 105 106  CO2 26 26 28   BUN 17 16 17   CREATININE 0.92 0.88 0.88  CALCIUM 11.4* 11.2* 11.2*  GLUCOSE 116* 105* 126*    CBG (last 3)   Recent Labs  10/13/13 1126  GLUCAP 139*    Scheduled Meds: . carbamazepine  400 mg Oral 2 times per day  . carbamazepine  600 mg Oral Q breakfast  . efavirenz-emtricitabine-tenofovir  1 tablet Oral QHS  . enoxaparin (LOVENOX) injection  40 mg Subcutaneous Q24H  .  gabapentin  300 mg Oral TID  . lactose free nutrition  237 mL Oral TID WC  . megestrol  400 mg Oral q morning - 10a  . PHENobarbital  64.8 mg Oral QHS    Continuous Infusions: . sodium chloride 75 mL/hr at 10/13/13 1600    Past Medical History  Diagnosis Date  . HIV infection dx'd 2008  . SOB (shortness of breath)   . Chronic pain in right shoulder     scapula and arm for 2 months   . Anxiety   . Protein-calorie malnutrition   . Tegretol toxicity     history of  . 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 Grand mal seizure 03/04/12  . Epileptic seizures     "I've had them all my life" (08/24/2013)    Past Surgical History  Procedure Laterality Date  . Fine needle aspiration    . Skin cancer excision Left 08/24/2013    wide excision cancer thigh/buttocks  . Wound debridement Left 06/24/2013    posterior thigh/notes 06/24/2013  . Melanoma excision Left 08/24/2013    Procedure: wide excision cancer of left buttock / leg ;  Surgeon: Shann Medal, MD;  Location: [place];  Service: General;  Laterality: Left;    Atlee Abide MS RD LDN Clinical Dietitian IONGE:[phone]

## 2013-10-13 NOTE — Progress Notes (Signed)
CBG checked per MD,rapid response Nurse notified, patient was placed on monitor. Bolus of 500 cc started per order.Patient able to answer simple words.oriented to person. Smiled at this nurse. Appears weak. Went to CT head  With rapid response nurse.Placed him on O2 at 2L per Ellis, o2 sat dropped to 88%.-  He will be transferrd to stepdown after the CT of head.Almon Hercules RN

## 2013-10-13 NOTE — Consult Note (Signed)
NEURO HOSPITALIST CONSULT NOTE    Reason for Consult: AMS  HPI:                                                                                                                                          Roger Walton is an 58 y.o. male with multiple medical co-morbidities including HIV, seizure disorder, small cell lung cancer followed by Dr. Julien Nordmann and a SCC of the left posterior thigh followed by Dr. Lucia Gaskins. Was in the ED prior to admission for FTT and was discharged home with [facility] therapies. Presents to see Dr. Julien Nordmann on 10/13/2010 where he was found to be clinically dehydrated and that has prompted a direct admission to the hospital. Wife states they are no longer able to care for him at home and would like to consider SNF placement.    Per nursing staff, he was slightly more lethargic than usual yesterday.  This AM he was given OxyContin.  After administration of OxyContin he was noted to become very lethargic but would answer questions appropriately if vigorous stimulation given.  As the day has progressed he has become more alert but still very drowsy. No seizure activity was noted. In talking to family, he seizures are usually staring spells but at times can have TC activity. Due to change in mental status neurology was consulted. Currently he is slightly drowsy but more alert and able to follow commands with no difficulty.     RPR normal B12 644 THS 2.36 Ammonia 21 CT head --no acute bleed or infarct.   Past Medical History  Diagnosis Date  . HIV infection dx'd 2008  . SOB (shortness of breath)   . Chronic pain in right shoulder     scapula and arm for 2 months   . Anxiety   . Protein-calorie malnutrition   . Tegretol toxicity     history of  . 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 Grand mal seizure 03/04/12  . Epileptic seizures     "I've had them all my life" (08/24/2013)     Past Surgical History  Procedure Laterality Date  . Fine needle aspiration    . Skin cancer excision Left 08/24/2013    wide excision cancer thigh/buttocks  . Wound debridement Left 06/24/2013    posterior thigh/notes 06/24/2013  . Melanoma excision Left 08/24/2013    Procedure: wide excision cancer of left buttock / leg ;  Surgeon: Shann Medal, MD;  Location: [facility] OR;  Service: General;  Laterality: Left;    Family History  Problem Relation Age of Onset  . Colon cancer Neg Hx      Social History:  reports that he  has quit smoking. His smoking use included Cigarettes. He has a 43 pack-year smoking history. He has never used smokeless tobacco. He reports that he drinks about 3.6 ounces of alcohol per week. He reports that he uses illicit drugs (Marijuana).  No Known Allergies  MEDICATIONS:                                                                                                                     Prior to Admission:  Prescriptions prior to admission  Medication Sig Dispense Refill  . carbamazepine (TEGRETOL) 200 MG tablet Take 400-600 mg by mouth 3 (three) times daily. Take 3 tabs (600mg ) in the morning, 2 tabs (400mg ) at noon, and 2 tabs (400mg ) at bedtime.      Marland Kitchen efavirenz-emtricitabine-tenofovir (ATRIPLA) 600-200-300 MG per tablet Take 1 tablet by mouth at bedtime.      . gabapentin (NEURONTIN) 300 MG capsule Take 300 mg by mouth 3 (three) times daily.      . megestrol (MEGACE) 40 MG/ML suspension Take 400 mg by mouth every morning.      Marland Kitchen oxyCODONE (OXY IR/ROXICODONE) 5 MG immediate release tablet Take 5 mg by mouth every 4 (four) hours as needed for severe pain.      . OxyCODONE (OXYCONTIN) 15 mg T12A 12 hr tablet Take 15 mg by mouth every 12 (twelve) hours.      Marland Kitchen PHENobarbital (LUMINAL) 64.8 MG tablet Take 64.8 mg by mouth at bedtime.       Scheduled: . carbamazepine  400 mg Oral 2 times per day  . carbamazepine  600 mg Oral Q breakfast  .  efavirenz-emtricitabine-tenofovir  1 tablet Oral QHS  . enoxaparin (LOVENOX) injection  40 mg Subcutaneous Q24H  . gabapentin  300 mg Oral TID  . lactose free nutrition  237 mL Oral TID WC  . megestrol  400 mg Oral q morning - 10a  . PHENobarbital  64.8 mg Oral QHS  . potassium chloride  10 mEq Intravenous Q1 Hr x 3     ROS:                                                                                                                                       History obtained from chart review  General ROS: negative for - chills, fatigue, fever, night sweats, weight gain or weight loss Psychological ROS: negative for - behavioral disorder, hallucinations, memory  difficulties, mood swings or suicidal ideation Ophthalmic ROS: negative for - blurry vision, double vision, eye pain or loss of vision ENT ROS: negative for - epistaxis, nasal discharge, oral lesions, sore throat, tinnitus or vertigo Allergy and Immunology ROS: negative for - hives or itchy/watery eyes Hematological and Lymphatic ROS: negative for - bleeding problems, bruising or swollen lymph nodes Endocrine ROS: negative for - galactorrhea, hair pattern changes, polydipsia/polyuria or temperature intolerance Respiratory ROS: negative for - cough, hemoptysis, shortness of breath or wheezing Cardiovascular ROS: negative for - chest pain, dyspnea on exertion, edema or irregular heartbeat Gastrointestinal ROS: negative for - abdominal pain, diarrhea, hematemesis, nausea/vomiting or stool incontinence Genito-Urinary ROS: negative for - dysuria, hematuria, incontinence or urinary frequency/urgency Musculoskeletal ROS: negative for - joint swelling or muscular weakness Neurological ROS: as noted in HPI Dermatological ROS: negative for rash and skin lesion changes   Blood pressure 97/71, pulse 82, temperature 97.7 F (36.5 C), temperature source Oral, resp. rate 14, height 6\' 1"  (1.854 m), weight 54.6 kg (120 lb 5.9 oz), SpO2  96.00%.   Neurologic Examination:                                                                                                      Mental Status: Alert, oriented, thought content appropriate.  Speech fluent without evidence of aphasia.  Able to follow 3 step commands without difficulty. Cranial Nerves: II: Discs flat bilaterally; Visual fields grossly normal, pupils equal, round, reactive to light and accommodation III,IV, VI: ptosis  Present right eye, extra-ocular motions intact bilaterally V,VII: smile symmetric, facial light touch sensation normal bilaterally VIII: hearing normal bilaterally IX,X: gag reflex present XI: bilateral shoulder shrug XII: midline tongue extension without atrophy or fasciculations  Motor: Right : Upper extremity   5/5 (grip weak)  Left:     Upper extremity   5/5  Lower extremity   5/5     Lower extremity   5/5 Tone and bulk:normal tone throughout; no atrophy noted Sensory: Pinprick and light touch intact throughout, bilaterally Deep Tendon Reflexes:  Right: Upper Extremity   Left: Upper extremity   biceps (C-5 to C-6) 2/4   biceps (C-5 to C-6) 2/4 tricep (C7) 2/4    triceps (C7) 2/4 Brachioradialis (C6) 2/4  Brachioradialis (C6) 2/4  Lower Extremity Lower Extremity  quadriceps (L-2 to L-4) 2/4   quadriceps (L-2 to L-4) 2/4 Achilles (S1) 0/4   Achilles (S1) 0/4  Plantars: Right: mute   Left: downgoing Cerebellar: normal finger-to-nose,  normal heel-to-shin test Gait: not tested.  CV: pulses palpable throughout    Lab Results: Basic Metabolic Panel:  Recent Labs Lab 09/27/2013 1605 10/13/13 0407 10/13/13 1145  NA 143 144 145  K 3.5* 3.3* 3.4*  CL 104 105 106  CO2 26 26 28   GLUCOSE 116* 105* 126*  BUN 17 16 17   CREATININE 0.92 0.88 0.88  CALCIUM 11.4* 11.2* 11.2*    Liver Function Tests:  Recent Labs Lab 10/01/2013 1605  AST 15  ALT 7  ALKPHOS 87  BILITOT 0.3  PROT 7.3  ALBUMIN 2.4*  No results found for this basename:  LIPASE, AMYLASE,  in the last 168 hours  Recent Labs Lab 10/13/13 1144  AMMONIA 21    CBC:  Recent Labs Lab 10/07/2013 1605 10/13/13 0407 10/13/13 1145  WBC 11.2* 6.2 8.2  HGB 9.3* 10.8* 8.1*  HCT 27.1* 31.8* 23.6*  MCV 98.2 97.8 99.2  PLT 299 246 322    Cardiac Enzymes: No results found for this basename: CKTOTAL, CKMB, CKMBINDEX, TROPONINI,  in the last 168 hours  Lipid Panel: No results found for this basename: CHOL, TRIG, HDL, CHOLHDL, VLDL, LDLCALC,  in the last 168 hours  CBG:  Recent Labs Lab 10/13/13 1126  Fernando Salinas 139*    Microbiology: Results for orders placed during the hospital encounter of 09/27/2013  MRSA PCR SCREENING     Status: None   Collection Time    09/24/2013  7:00 PM      Result Value Ref Range Status   MRSA by PCR NEGATIVE  NEGATIVE Final   Comment:            The GeneXpert MRSA Assay (FDA     approved for NASAL specimens     only), is one component of a     comprehensive MRSA colonization     surveillance program. It is not     intended to diagnose MRSA     infection nor to guide or     monitor treatment for     MRSA infections.    Coagulation Studies: No results found for this basename: LABPROT, INR,  in the last 72 hours  Imaging: Ct Head Wo Contrast  10/13/2013   CLINICAL DATA:  Altered mental status. Scleral cell carcinoma. HIV.  EXAM: CT HEAD WITHOUT CONTRAST  TECHNIQUE: Contiguous axial images were obtained from the base of the skull through the vertex without intravenous contrast.  COMPARISON:  08/30/2013  FINDINGS: No acute intracranial abnormality. Specifically, no hemorrhage, hydrocephalus, mass lesion, acute infarction, or significant intracranial injury. No acute calvarial abnormality. Visualized paranasal sinuses and mastoids clear. Orbital soft tissues unremarkable.  IMPRESSION: No acute intracranial abnormality.   Electronically Signed   By: Rolm Baptise M.D.   On: 10/13/2013 12:32       Assessment and plan per  attending neurologist  Etta Quill PA-C [facility] [phone]  10/13/2013, 1:50 PM   Assessment/Plan:  58 YO male with period of increased lethargy this AM following administration of Narcotic. Currently he is alert and able to follow commands.  Most likely sedation secondary to medication.   Recommend: 1) Decreasing sedating medications if possible 2) Continue home AED 3) will follow in AM.   I personally participated in this patient's evaluation and management, including formulating above clinical assessment and management recommendations.  Rush Farmer M.D. [facility] [phone]

## 2013-10-13 NOTE — Progress Notes (Signed)
Patient's wife Olin Hauser notified about patient's transfer to stepdown.- Sandie Ano RN

## 2013-10-14 ENCOUNTER — Ambulatory Visit: Payer: Medicare Other

## 2013-10-14 ENCOUNTER — Inpatient Hospital Stay (HOSPITAL_COMMUNITY): Payer: Medicare Other

## 2013-10-14 ENCOUNTER — Ambulatory Visit
Admission: RE | Admit: 2013-10-14 | Discharge: 2013-10-14 | Disposition: A | Discharge status: Home / Self Care | Payer: Medicare Other | Source: Ambulatory Visit | Attending: Radiation Oncology | Admitting: Radiation Oncology

## 2013-10-14 ENCOUNTER — Encounter: Payer: Self-pay | Admitting: Radiation Oncology

## 2013-10-14 DIAGNOSIS — G9340 Encephalopathy, unspecified: Secondary | ICD-10-CM

## 2013-10-14 DIAGNOSIS — C774 Secondary and unspecified malignant neoplasm of inguinal and lower limb lymph nodes: Secondary | ICD-10-CM

## 2013-10-14 LAB — BASIC METABOLIC PANEL
BUN: 14 mg/dL (ref 6–23)
BUN: 12 mg/dL (ref 6–23)
CHLORIDE: 104 meq/L (ref 96–112)
CO2: 24 mEq/L (ref 19–32)
CO2: 25 mEq/L (ref 19–32)
CREATININE: 0.68 mg/dL (ref 0.50–1.35)
Calcium: 10.4 mg/dL (ref 8.4–10.5)
Calcium: 10.4 mg/dL (ref 8.4–10.5)
Chloride: 104 mEq/L (ref 96–112)
Creatinine, Ser: 0.7 mg/dL (ref 0.50–1.35)
GFR calc Af Amer: >90 mL/min (ref >90)
GFR calc non Af Amer: >90 mL/min (ref >90)
GFR calc non Af Amer: >90 mL/min (ref >90)
GLUCOSE: 101 mg/dL — AB (ref 70–99)
Glucose, Bld: 92 mg/dL (ref 70–99)
Potassium: 3.6 mEq/L — ABNORMAL LOW (ref 3.7–5.3)
Potassium: 4.2 mEq/L (ref 3.7–5.3)
Sodium: 139 mEq/L (ref 137–147)
Sodium: 138 mEq/L (ref 137–147)

## 2013-10-14 LAB — URINE CULTURE
Colony Count: NO GROWTH
Culture: NO GROWTH

## 2013-10-14 LAB — CBC
HEMATOCRIT: 29.7 % — AB (ref 39.0–52.0)
HEMOGLOBIN: 10 g/dL — AB (ref 13.0–17.0)
MCH: 33.7 pg (ref 26.0–34.0)
MCHC: 33.7 g/dL (ref 30.0–36.0)
MCV: 100 fL (ref 78.0–100.0)
Platelets: 229 10*3/uL (ref 150–400)
RBC: 2.97 MIL/uL — AB (ref 4.22–5.81)
RDW: 13.7 % (ref 11.5–15.5)
WBC: 5.8 10*3/uL (ref 4.0–10.5)

## 2013-10-14 LAB — GLUCOSE, CAPILLARY: Glucose-Capillary: 79 mg/dL (ref 70–99)

## 2013-10-14 MED ORDER — DEXTROSE 5 % IV SOLN
500.0000 mg | INTRAVENOUS | Status: DC
  Administered 2013-10-14 – 2013-10-16 (×3): 500 mg via INTRAVENOUS
  Filled 2013-10-14 (×4): qty 500

## 2013-10-14 MED ORDER — GABAPENTIN 250 MG/5ML PO SOLN
300.0000 mg | Freq: Three times a day (TID) | ORAL | Status: DC
  Administered 2013-10-14 – 2013-10-16 (×7): 300 mg
  Filled 2013-10-14 (×11): qty 6

## 2013-10-14 MED ORDER — POTASSIUM CHLORIDE CRYS ER 20 MEQ PO TBCR
40.0000 meq | EXTENDED_RELEASE_TABLET | Freq: Once | ORAL | Status: DC
  Filled 2013-10-14: qty 2

## 2013-10-14 MED ORDER — POTASSIUM CHLORIDE 20 MEQ/15ML (10%) PO LIQD
40.0000 meq | Freq: Once | ORAL | Status: AC
  Administered 2013-10-14: 40 meq
  Filled 2013-10-14: qty 30

## 2013-10-14 NOTE — Progress Notes (Signed)
  Radiation Oncology         [phone] ________________________________  Name: Roger Walton MRN: [id number]  Date: 10/14/2013  DOB: 01/01/1956  INPATIENT  Weekly Radiation Therapy Management  Current Dose: 8 Gy     Planned Dose:  24 Gy  Narrative . . . . . . . . The patient presents for routine under treatment assessment.                                   The patient is without complaint.  Left groin pain persists.                                 Set-up films were reviewed.                                 The chart was checked. Physical Findings. . . . Weight essentially stable.  No significant changes. Impression . . . . . . . The patient is tolerating radiation. Plan . . . . . . . . . . . . Continue treatment as planned.  ________________________________  Sheral Apley. Tammi Klippel, M.D.

## 2013-10-14 NOTE — Progress Notes (Signed)
OT Cancellation Note  Patient Details Name: Roger Walton MRN: [id number] DOB: Sep 07, 1955   Cancelled Treatment:    Reason Eval/Treat Not Completed: Other (comment) As per PT note, will check back later time. Note palliative consult.  Jules Schick [phone] 10/14/2013, 9:08 AM

## 2013-10-14 NOTE — Progress Notes (Signed)
Clinical Social Work Department BRIEF PSYCHOSOCIAL ASSESSMENT 10/14/2013  Patient:  Roger Walton, Roger Walton     Account Number:  [id number]     Admit date:  09/30/2013  Clinical Social Worker:  Ulyess Blossom  Date/Time:  10/14/2013 03:00 PM  Referred by:  Physician  Date Referred:  10/14/2013 Referred for  SNF Placement   Other Referral:   Interview type:  Family Other interview type:    PSYCHOSOCIAL DATA Living Status:  WIFE Admitted from facility:   Level of care:   Primary support name:  Olin Hauser Bellotti/spouse/[phone] Primary support relationship to patient:  SPOUSE Degree of support available:   adequate    CURRENT CONCERNS Current Concerns  Post-Acute Placement   Other Concerns:    SOCIAL WORK ASSESSMENT / PLAN CSW received referral for New SNF placement however pt transferred down to step down unit and now has NG tube in place. CSW reviewed chart and noted that palliative care consult has been placed, but no meeting has yet been scheduled. CSW spoke to RN who stated that pt wife is a bedside, but has poor insight into pt medical conditions.    CSW met with pt wife at bedside. CSW introduced self and explained role. Pt wife shared that pt lived at home with her prior to admission. CSW discussed recommendation for palliative meeting and pt wife discussed that she is attempting to schedule a time that pt daughter's can be present. CSW expressed understanding and encouraged pt wife to make sure that pt daughters know importance of meeting in order to establish pt goals of care. CSW informed pt wife that palliative team can conference call daughters that may not be able to be present for discussion. CSW offered to assist pt in contacting pt daughter, but pt declined and stated that pt daughter should be off work soon and she anticipates to hear from her then. Pt confirmed that she had phone number to contact palliative medicine team once time scheduled with daughters. CSW provided support  and provided CSW contact information to pt wife as well.    CSW received phone call from Adult Protective Services (APS) social worker, Ernest Pine earlier today and met with APS social worker outside of ICU. APS social worker discussed that APS referral was made prior to pt admission to the hospital when pt did not arrive to his scheduled appointments at the cancer center. APS social worker discussed that pt wife is unable to care for pt at home and that pt will need placement at discharge as pt has unsafe living environment and pt wife is unable to provide level of care pt needs. CSW discussed with APS social worker that pt has palliative care meeting pending and CSW will update APS social worker about pt disposition planning.    CSW to continue to follow and provide support and assist with disposition planning as appropriate. Pt wife reports that she will be communicating with pt daughter in order to schedule meeting with palliative care team.    CSW to continue to follow.   Assessment/plan status:  Psychosocial Support/Ongoing Assessment of Needs Other assessment/ plan:   Information/referral to community resources:   referrals pending recommendations    PATIENT'S/FAMILY'S RESPONSE TO PLAN OF CARE: Pt sleeping soundly at time of visit. Pt wife appreciative of visit and CSW support. Pt wife hopeful to arrange meeting time with palliative care once she is able to speak with pt daughters. Pt wife aware that CSW available to assist in communicating with pt daughter about  importance of scheduling meeting time with palliative care in order to establish goals of care.   Alison Murray, MSW, [facility] [phone]

## 2013-10-14 NOTE — Evaluation (Signed)
Clinical/Bedside Swallow Evaluation Patient Details  Name: Roger Walton MRN: [id number] Date of Birth: [birth year]  Today's Date: 10/14/2013 Time: 1100-1120 SLP Time Calculation (min): 20 min  Past Medical History:  Past Medical History  Diagnosis Date  . HIV infection dx'd 2008  . SOB (shortness of breath)   . Chronic pain in right shoulder     scapula and arm for 2 months   . Anxiety   . Protein-calorie malnutrition   . Tegretol toxicity     history of  . 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 Grand mal seizure 03/04/12  . Epileptic seizures     "I've had them all my life" (08/24/2013)   Past Surgical History:  Past Surgical History  Procedure Laterality Date  . Fine needle aspiration    . Skin cancer excision Left 08/24/2013    wide excision cancer thigh/buttocks  . Wound debridement Left 06/24/2013    posterior thigh/notes 06/24/2013  . Melanoma excision Left 08/24/2013    Procedure: wide excision cancer of left buttock / leg ;  Surgeon: Shann Medal, MD;  Location: [facility];  Service: General;  Laterality: Left;   HPI:  Roger Walton is an 58 y.o. male with multiple medical co-morbidities including HIV, seizure disorder, small cell lung cancer followed by Dr. Julien Nordmann and a SCC of the left posterior thigh followed by Dr. Lucia Gaskins. Was in the ED prior to admission for FTT and was discharged home with [facility] therapies. Presents to see Dr. Julien Nordmann on 10/13/2010 where he was found to be clinically dehydrated and that has prompted a direct admission to the hospital. Wife states they are no longer able to care for him at home and would like to consider SNF placement. Pt noted to become significantly lethargic and developed trouble swallowing after being given oxycontin. RN reprots his voice is wet and he is coughing up thick secretions. Pt needs to have seizure medication given today at 1200 or NG tube will need to  be placed. CT chest on 6/8 reports Secretions are seen within the mainstem bronchi bilaterally, suspicious for aspiration. There is mild patchy airspace disease seen in the superior segments of the lower lobes bilaterally, aspiration pneumonitis. There is no history of pt being seen by SLP. There is a pending meeting for palliative care.    Assessment / Plan / Recommendation Clinical Impression  Pt demonstrates subjective evidence of a profound dysphagia. Prior to POs, cough is weak, wet and congested. SLP was able to suction large amounts of thick secretions expectorated with cues to clear over multiple trials. After minimal PO trials, pts voice again wet and applesauce and water suctioned from base of tongue. Pt is at high risk of aspiration with all POs and CT of chest this month also suggests more of a chronic, progressive dysphagia rather than an acute dysphagia resulting from lethargy. In fact, pt is quite alert during assessment. . Discussed findings with pts wife who will need f/u by MD and hopefully Palliative care MD. Pt is not safe for PO, would suggest short term method of nutrition and delivery of medication as pt/family and MD determine course of care. Prognosis for functional swallow in the short term is poor, but will follow for trials of PO and further assessment as pts treatment continues.     Aspiration Risk  Severe    Diet Recommendation NPO;Alternative means - temporary  Medication Administration: Via alternative means    Other  Recommendations     Follow Up Recommendations       Frequency and Duration min 2x/week  2 weeks   Pertinent Vitals/Pain NA    SLP Swallow Goals     Swallow Study Prior Functional Status       General HPI: Roger Walton is an 58 y.o. male with multiple medical co-morbidities including HIV, seizure disorder, small cell lung cancer followed by Dr. Julien Nordmann and a SCC of the left posterior thigh followed by Dr. Lucia Gaskins. Was in the ED prior to  admission for FTT and was discharged home with [facility] therapies. Presents to see Dr. Julien Nordmann on 10/13/2010 where he was found to be clinically dehydrated and that has prompted a direct admission to the hospital. Wife states they are no longer able to care for him at home and would like to consider SNF placement. Pt noted to become significantly lethargic and developed trouble swallowing after being given oxycontin. RN reprots his voice is wet and he is coughing up thick secretions. Pt needs to have seizure medication given today at 1200 or NG tube will need to be placed. CT chest on 6/8 reports Secretions are seen within the mainstem bronchi bilaterally, suspicious for aspiration. There is mild patchy airspace disease seen in the superior segments of the lower lobes bilaterally, aspiration pneumonitis. There is no history of pt being seen by SLP. There is a pending meeting for palliative care.  Type of Study: Bedside swallow evaluation Previous Swallow Assessment: none Diet Prior to this Study: Regular;Thin liquids Temperature Spikes Noted: No Respiratory Status: Nasal cannula History of Recent Intubation: No Behavior/Cognition: Alert;Cooperative;Pleasant mood Oral Cavity - Dentition: Edentulous Self-Feeding Abilities: Needs assist Patient Positioning: Upright in bed Baseline Vocal Quality: Wet Volitional Cough: Congested;Weak;Wet Volitional Swallow: Able to elicit    Oral/Motor/Sensory Function Overall Oral Motor/Sensory Function: Appears within functional limits for tasks assessed   Ice Chips Ice chips: Impaired Presentation: Spoon Pharyngeal Phase Impairments: Decreased hyoid-laryngeal movement;Wet Vocal Quality   Thin Liquid Thin Liquid: Impaired Presentation: Cup;Self Fed Pharyngeal  Phase Impairments: Decreased hyoid-laryngeal movement;Wet Vocal Quality;Cough - Delayed    Nectar Thick Nectar Thick Liquid: Not tested   Honey Thick Honey Thick Liquid: Not tested   Puree Puree:  Impaired Presentation: Spoon Pharyngeal Phase Impairments: Decreased hyoid-laryngeal movement;Wet Vocal Quality;Cough - Delayed   Solid   GO    Solid: Not tested      Herbie Baltimore, MA CCC-SLP [phone]  Lynann Beaver 10/14/2013,11:24 AM

## 2013-10-14 NOTE — Progress Notes (Signed)
[facility] Therapy Treatment Record Phone [phone]   Radiation Therapy was administered to Roger Walton on: 10/14/2013  5:10 PM and was treatment # 3 out of a planned course of 6 treatments.

## 2013-10-14 NOTE — Progress Notes (Signed)
Printed and reviewed [place] sheet on bedbugs with patient and his wife.  They both verbalized understanding. Patients personal items have been sent home and there has been no evidence of any bugs since admission. There is no need for contact precautions. I left my contact info with patients wife in case she has any questions/concerns. If staff have any questions/concerns, please feel free to contact Leary Roca, RN, Infection Prevention Specialist at ext. [phone].

## 2013-10-14 NOTE — Progress Notes (Signed)
[facility] PROGRESS NOTE  Roger Walton LOV:[id number] DOB: [birth year] DOA: 10/13/2013 PCP: Bobby Rumpf, MD  Assessment/Plan: Encephalopathy: AMS.  Differential secondary to pain medication vs seizure.  CT head stat negative for bleed. Holding pain medications.  ABG with no hypercapnia.  Neurology consulted. Appreciate neuro help.  Patient more alert, event might be related to opioids.  Fail swallow evaluation. NG tube placement for medications.   PNA, possible aspiration; Patient with cough, fail swallow evaluation, Chest x ray with left lower lobe atelectasis vs consolidation. continue with Unasyn. azithromycin  for atypical coverage in case of Community acquired.   Adult FTT/Generalized Weakness/Dehydration  -Admit for rehydration purposes.  - B-12 644/TSH 2.3/RPR negative.  -PT/OT evals.   HIV  -Continue antiviral meds. (atripla)   Seizure meds  -Continue phenobarbital.   -Tegretol.  -NG tube in place for medications. Fail swallow evaluation.   Lung Cancer/SCC of left leg.  -Follow with Dr Julien Nordmann.  -received radiation 6-24.   Severe Protein-Caloric Malnutrition  -Dietitian consult.  -Boost plus.  -will probably start tube feeding tomorrow.   DVT Prophylaxis  -Lovenox   Code Status  -Full Code   Code Status: full code.  Family Communication: care discussed with wife.  Disposition Plan: palliative care meeting for goals of care.    Consultants:  Neurology  Palliative care.   Dr Julien Nordmann.   Procedures:  none  Antibiotics:  Unasyn 6-24.  Azithromycin 6-24.   HPI/Subjective: Alert, following commands, denies pain this morning. Has productive cough.   Objective: Filed Vitals:   10/14/13 0800  BP: 114/63  Pulse: 90  Temp:   Resp: 13    Intake/Output Summary (Last 24 hours) at 10/14/13 0900 Last data filed at 10/14/13 0847  Gross per 24 hour  Intake   2365 ml  Output    350 ml  Net   2015 ml   Filed Weights   10/04/2013 1259  10/13/13 1230  Weight: 52.4 kg (115 lb 8.3 oz) 54.6 kg (120 lb 5.9 oz)    Exam:   General: alert, in no distress.   Cardiovascular: S 1, S 2 RRR  Respiratory: Bilateral ronchus.   Abdomen: BS present, soft, NT  Musculoskeletal: no edema.   Neuro: Alert, in no distress, following commands.   Data Reviewed: Basic Metabolic Panel:  Recent Labs Lab 10/15/2013 1605 10/13/13 0407 10/13/13 1145 10/14/13 0341  NA 143 144 145 139  K 3.5* 3.3* 3.4* 3.6*  CL 104 105 106 104  CO2 26 26 28 24   GLUCOSE 116* 105* 126* 101*  BUN 17 16 17 14   CREATININE 0.92 0.88 0.88 0.70  CALCIUM 11.4* 11.2* 11.2* 10.4   Liver Function Tests:  Recent Labs Lab 10/07/2013 1605  AST 15  ALT 7  ALKPHOS 87  BILITOT 0.3  PROT 7.3  ALBUMIN 2.4*   No results found for this basename: LIPASE, AMYLASE,  in the last 168 hours  Recent Labs Lab 10/13/13 1144  AMMONIA 21   CBC:  Recent Labs Lab 10/15/2013 1605 10/13/13 0407 10/13/13 1145 10/14/13 0341  WBC 11.2* 6.2 8.2 5.8  HGB 9.3* 10.8* 8.1* 10.0*  HCT 27.1* 31.8* 23.6* 29.7*  MCV 98.2 97.8 99.2 100.0  PLT 299 246 322 229   Cardiac Enzymes: No results found for this basename: CKTOTAL, CKMB, CKMBINDEX, TROPONINI,  in the last 168 hours BNP (last 3 results) No results found for this basename: PROBNP,  in the last 8760 hours CBG:  Recent Labs Lab 10/13/13 1126  GLUCAP 139*    Recent Results (from the past 240 hour(s))  MRSA PCR SCREENING     Status: None   Collection Time    10/15/2013  7:00 PM      Result Value Ref Range Status   MRSA by PCR NEGATIVE  NEGATIVE Final   Comment:            The GeneXpert MRSA Assay (FDA     approved for NASAL specimens     only), is one component of a     comprehensive MRSA colonization     surveillance program. It is not     intended to diagnose MRSA     infection nor to guide or     monitor treatment for     MRSA infections.     Studies: Ct Head Wo Contrast  10/13/2013   CLINICAL DATA:   Altered mental status. Scleral cell carcinoma. HIV.  EXAM: CT HEAD WITHOUT CONTRAST  TECHNIQUE: Contiguous axial images were obtained from the base of the skull through the vertex without intravenous contrast.  COMPARISON:  08/30/2013  FINDINGS: No acute intracranial abnormality. Specifically, no hemorrhage, hydrocephalus, mass lesion, acute infarction, or significant intracranial injury. No acute calvarial abnormality. Visualized paranasal sinuses and mastoids clear. Orbital soft tissues unremarkable.  IMPRESSION: No acute intracranial abnormality.   Electronically Signed   By: Rolm Baptise M.D.   On: 10/13/2013 12:32   Dg Chest Port 1 View  10/13/2013   CLINICAL DATA:  Hypoxemia  EXAM: PORTABLE CHEST - 1 VIEW  COMPARISON:  09/26/2013  FINDINGS: Focal airspace opacity in the left lower lobe. Possible small left effusion. No confluent opacity on the right. Hyperinflation compatible with COPD. Heart is normal size. No acute bony abnormality.  IMPRESSION: Left lower lobe atelectasis or consolidation with small left effusion.   Electronically Signed   By: Rolm Baptise M.D.   On: 10/13/2013 15:52    Scheduled Meds: . ampicillin-sulbactam (UNASYN) IV  3 g Intravenous Q6H  . azithromycin  500 mg Intravenous Q24H  . carbamazepine  400 mg Oral 2 times per day  . carbamazepine  600 mg Oral Q breakfast  . efavirenz-emtricitabine-tenofovir  1 tablet Oral QHS  . enoxaparin (LOVENOX) injection  40 mg Subcutaneous Q24H  . gabapentin  300 mg Oral TID  . lactose free nutrition  237 mL Oral TID WC  . megestrol  400 mg Oral q morning - 10a  . PHENobarbital  64.8 mg Oral QHS  . potassium chloride  40 mEq Oral Once   Continuous Infusions: . 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     Niel Hummer A  [facility] Pager [phone]. If 7PM-7AM, please contact night-coverage at [email or web address], password [facility] 10/14/2013, 9:00 AM  LOS: 2 days

## 2013-10-14 NOTE — Progress Notes (Signed)
PT Cancellation Note  Patient Details Name: SAHAN PEN MRN: [id number] DOB: 07-Mar-1956   Cancelled Treatment:    Reason Eval/Treat Not Completed: Medical issues which prohibited therapy (moved to SDU) Palliative care to consult.will check back later.   Claretha Cooper 10/14/2013, 8:34 AM Tresa Endo PT [phone]

## 2013-10-14 NOTE — Progress Notes (Signed)
Subjective: Patient is awake and alert, no complaints. And able to follow commands.  Nurse at bedside states he has been having difficulty with swallowing.   Objective: Current vital signs: BP 114/63  Pulse 90  Temp(Src) 98.4 F (36.9 C) (Oral)  Resp 13  Ht 6\' 1"  (1.854 m)  Wt 54.6 kg (120 lb 5.9 oz)  BMI 15.88 kg/m2  SpO2 99% Vital signs in last 24 hours: Temp:  [97.7 F (36.5 C)-98.4 F (36.9 C)] 98.4 F (36.9 C) (06/25 0402) Pulse Rate:  [78-92] 90 (06/25 0800) Resp:  [12-25] 13 (06/25 0800) BP: (89-124)/(59-78) 114/63 mmHg (06/25 0800) SpO2:  [93 %-100 %] 99 % (06/25 0800) Weight:  [54.6 kg (120 lb 5.9 oz)] 54.6 kg (120 lb 5.9 oz) (06/24 1230)  Intake/Output from previous day: 06/24 0701 - 06/25 0700 In: 2190 [P.O.:540; I.V.:1150; IV Piggyback:500] Out: 350 [Urine:350] Intake/Output this shift: Total I/O In: 175 [I.V.:75; IV Piggyback:100] Out: -  Nutritional status: General  Neurologic Exam: Mental Status: Alert, oriented, thought content appropriate.  Speech dysarthric due to lack of teeth without evidence of aphasia.  Able to follow 3 step commands without difficulty. Cranial Nerves: II:  Visual fields grossly normal, pupils equal, round, reactive to light and accommodation III,IV, VI: ptosis not present, extra-ocular motions intact bilaterally V,VII: smile symmetric, facial light touch sensation normal bilaterally VIII: hearing normal bilaterally IX,X: gag reflex present XI: bilateral shoulder shrug XII: midline tongue extension without atrophy or fasciculations  Motor: Right : Upper extremity   5/5 (weak grip)  Left:     Upper extremity   5/5  Lower extremity   5/5     Lower extremity   5/5 Tone and bulk:normal tone throughout; no atrophy noted Sensory: Pinprick and light touch intact throughout, bilaterally Deep Tendon Reflexes:  Right: Upper Extremity   Left: Upper extremity   biceps (C-5 to C-6) 2/4   biceps (C-5 to C-6) 2/4 tricep (C7)  2/4    triceps (C7) 2/4 Brachioradialis (C6) 2/4  Brachioradialis (C6) 2/4  Lower Extremity Lower Extremity  quadriceps (L-2 to L-4) 2/4   quadriceps (L-2 to L-4) 2/4 Achilles (S1) 0/4   Achilles (S1) 0/4  Plantars: Right: mute   Left: downgoing Cerebellar: normal finger-to-nose,  normal heel-to-shin test Gait: not able to test CV: pulses palpable throughout    Lab Results: Basic Metabolic Panel:  Recent Labs Lab 09/21/2013 1605 10/13/13 0407 10/13/13 1145 10/14/13 0341  NA 143 144 145 139  K 3.5* 3.3* 3.4* 3.6*  CL 104 105 106 104  CO2 26 26 28 24   GLUCOSE 116* 105* 126* 101*  BUN 17 16 17 14   CREATININE 0.92 0.88 0.88 0.70  CALCIUM 11.4* 11.2* 11.2* 10.4    Liver Function Tests:  Recent Labs Lab 10/07/2013 1605  AST 15  ALT 7  ALKPHOS 87  BILITOT 0.3  PROT 7.3  ALBUMIN 2.4*   No results found for this basename: LIPASE, AMYLASE,  in the last 168 hours  Recent Labs Lab 10/13/13 1144  AMMONIA 21    CBC:  Recent Labs Lab 10/12/2013 1605 10/13/13 0407 10/13/13 1145 10/14/13 0341  WBC 11.2* 6.2 8.2 5.8  HGB 9.3* 10.8* 8.1* 10.0*  HCT 27.1* 31.8* 23.6* 29.7*  MCV 98.2 97.8 99.2 100.0  PLT 299 246 322 229    Cardiac Enzymes: No results found for this basename: CKTOTAL, CKMB, CKMBINDEX, TROPONINI,  in the last 168 hours  Lipid Panel: No results found for this basename: CHOL, TRIG, HDL,  CHOLHDL, VLDL, LDLCALC,  in the last 168 hours  CBG:  Recent Labs Lab 10/13/13 1126  [place] 139*    Microbiology: Results for orders placed during the hospital encounter of 09/25/2013  MRSA PCR SCREENING     Status: None   Collection Time    09/28/2013  7:00 PM      Result Value Ref Range Status   MRSA by PCR NEGATIVE  NEGATIVE Final   Comment:            The GeneXpert MRSA Assay (FDA     approved for NASAL specimens     only), is one component of a     comprehensive MRSA colonization     surveillance program. It is not     intended to diagnose MRSA      infection nor to guide or     monitor treatment for     MRSA infections.    Coagulation Studies: No results found for this basename: LABPROT, INR,  in the last 72 hours  Imaging: Ct Head Wo Contrast  10/13/2013   CLINICAL DATA:  Altered mental status. Scleral cell carcinoma. HIV.  EXAM: CT HEAD WITHOUT CONTRAST  TECHNIQUE: Contiguous axial images were obtained from the base of the skull through the vertex without intravenous contrast.  COMPARISON:  08/30/2013  FINDINGS: No acute intracranial abnormality. Specifically, no hemorrhage, hydrocephalus, mass lesion, acute infarction, or significant intracranial injury. No acute calvarial abnormality. Visualized paranasal sinuses and mastoids clear. Orbital soft tissues unremarkable.  IMPRESSION: No acute intracranial abnormality.   Electronically Signed   By: Rolm Baptise M.D.   On: 10/13/2013 12:32   Dg Chest Port 1 View  10/13/2013   CLINICAL DATA:  Hypoxemia  EXAM: PORTABLE CHEST - 1 VIEW  COMPARISON:  09/26/2013  FINDINGS: Focal airspace opacity in the left lower lobe. Possible small left effusion. No confluent opacity on the right. Hyperinflation compatible with COPD. Heart is normal size. No acute bony abnormality.  IMPRESSION: Left lower lobe atelectasis or consolidation with small left effusion.   Electronically Signed   By: Rolm Baptise M.D.   On: 10/13/2013 15:52    Medications:  Scheduled: . ampicillin-sulbactam (UNASYN) IV  3 g Intravenous Q6H  . azithromycin  500 mg Intravenous Q24H  . carbamazepine  400 mg Oral 2 times per day  . carbamazepine  600 mg Oral Q breakfast  . efavirenz-emtricitabine-tenofovir  1 tablet Oral QHS  . enoxaparin (LOVENOX) injection  40 mg Subcutaneous Q24H  . gabapentin  300 mg Oral TID  . lactose free nutrition  237 mL Oral TID WC  . megestrol  400 mg Oral q morning - 10a  . PHENobarbital  64.8 mg Oral QHS  . potassium chloride  40 mEq Oral Once    Assessment/Plan: 58 YO male with period of increased  lethargy this AM following administration of Narcotic. Currently he is alert and able to follow commands. Most likely sedation secondary to medication. Currently he is having difficulty swallowing and made NPO.  His next does of phenobarbital is at 2200 and Tegretal is at 1200.  It is important he receive his next dose of phenobarbital.  This has been expressed to Dr. Tyrell Antonio and she has ordered a speech evaluation.  If fails and or does not receive evaluation prior to 2200 a NG tube will be placed.    Recommend:  1) Decreasing sedating medications if possible  2) Continue home AED    Etta Quill PA-C [facility] [phone]  10/14/2013, 9:11 AM

## 2013-10-15 ENCOUNTER — Encounter: Payer: Medicare Other | Admitting: Radiation Oncology

## 2013-10-15 ENCOUNTER — Ambulatory Visit: Payer: Medicare Other

## 2013-10-15 ENCOUNTER — Encounter: Payer: Self-pay | Admitting: Radiation Oncology

## 2013-10-15 ENCOUNTER — Ambulatory Visit
Admission: RE | Admit: 2013-10-15 | Discharge: 2013-10-15 | Disposition: A | Discharge status: Home / Self Care | Payer: Medicare Other | Source: Ambulatory Visit | Attending: Radiation Oncology | Admitting: Radiation Oncology

## 2013-10-15 DIAGNOSIS — J189 Pneumonia, unspecified organism: Secondary | ICD-10-CM

## 2013-10-15 LAB — CBC
HEMATOCRIT: 29.5 % — AB (ref 39.0–52.0)
Hemoglobin: 10.2 g/dL — ABNORMAL LOW (ref 13.0–17.0)
MCH: 33.8 pg (ref 26.0–34.0)
MCHC: 34.6 g/dL (ref 30.0–36.0)
MCV: 97.7 fL (ref 78.0–100.0)
Platelets: 245 10*3/uL (ref 150–400)
RBC: 3.02 MIL/uL — AB (ref 4.22–5.81)
RDW: 13.4 % (ref 11.5–15.5)
WBC: 5.7 10*3/uL (ref 4.0–10.5)

## 2013-10-15 NOTE — Progress Notes (Signed)
CSW continuing to follow.  CSW reviewed chart and noted PMT GOC will hopefully be held this weekend. CSW noted PT/OT recommending SNF. CSW spoke with RN who discussed that pt has NG tube for medications, but pt NPO until goals can be addressed with pt family as there is a concern for aspiration.   CSW attempted to meet with pt at bedside. Pt opened his eyes upon CSW entering and CSW introduced self and explained role. CSW attempted to engage pt in discussion, but pt drifted back to sleep.  CSW contacted pt wife, Olin Hauser via telephone. CSW provided support and pt wife stated that hopeful pt family can meet with palliative care this weekend. CSW discussed with pt wife that PT/OT saw pt and recommended SNF, but SNF search cannot yet be initiated as pt nutrition status needs to be clarified before SNF search. CSW explained to pt wife that recommendation is for pt to be in a facility following hospitalization and pt wife agreeable. However, upon further discussion, pt wife asked if pt would come home from the hospital and CSW explained again that recommendation is for a facility, but until nutrition needs are clarified it is not appropriate for CSW to initiate search.   CSW to ask weekend social worker to follow up following recommendations from PMT [place] meeting and assist with disposition planning depending on recommendations.  CSW to continue to follow.  Alison Murray, MSW, [place] [facility] [phone]

## 2013-10-15 NOTE — Progress Notes (Signed)
NUTRITION FOLLOW UP  Intervention:   -Monitor GOC per palliative care meeting -As warranted per pt/family request: Initiate Osmolite 1.2 @ 20 ml/hr via NGT and increase by 10 ml every 8 hours to goal rate of 70 ml/hr. Tube feeding regimen provides 2016 kcal (100% of needs), 93 grams of protein, and 1378 ml of H2O. Will need additional 600 ml free water if IVF d/c'd. -Implement conservative advancement of tube feeding d/t risk of refeeding, recommend refeeding labs (Mg,Phos,K) if/once TF started for 48 hours -Consider use of Osmolite 1.5 at goal rate of 55 ml/hr if pt unable tolerate Osmolite 1.2 -Will continue to monitor  Nutrition Dx:   Inadequate oral intake related to decreased appetite/weakness as evidenced by PO intake <75%, 11.1% body weight loss in 3 months- ongoing    Goal:   Pt to meet >/= 90% of their estimated nutrition needs -not met   Monitor:   TF orders, GOC, diet order, swallow profile, labs, weights  Assessment:   6/24: -Pt reported minimal PO intake, < one meal/day. Has been unable to prepare meals for self d/t weakness and lethargy  -Has been on Boost supplement during previous admit in 08/2013. Will continue with supplement regimen  -Endorsed an overall unintentional wt loss of 140 lbs. Was unable to quantify when weight loss occurred. Has experienced a 15 lbs weight loss (11.15 body weight loss in three months, severe for time)  -Has had <25% PO intake, assisted pt in feeding assistance. Endorsed feelings of weakness and unable to feed self.  -Pt concerned with lack of resources/assistance upon d/c, recommend SNF placement as RD concerned for nutritional status/ongoing weight loss if pt goes home w/out assistance  -Pt with severe muscle wasting and subcutaneous fat loss in temporal, orbital, upper arm, clavicle, thigh and calf region  6/26: -Pt transferred to ICU d/t rapid response. Currently stable and was transferred back to [facility] -NGT placed for medications as  MD expressed concerns for aspiration. SLP evaluated pt on 6/26 and recommended NPO status w/MBS  -Palliative care meeting to be held this weekend per RN. Plans for initiation of nutrition support have yet to be decided. Recommended refeeding labs be ordering if TF initiated -TF recommendations placed. Will continue to monitor [place]  Height: Ht Readings from Last 1 Encounters:  10/13/13 6' 1"  (1.854 m)    Weight Status:   Wt Readings from Last 1 Encounters:  10/15/13 126 lb 8.7 oz (57.4 kg)    Re-estimated needs:  Kcal: 1900-2100  Protein: 100-110 gram  Fluid: >/=1900 ml/daily   Skin: WDL  Diet Order: NPO   Intake/Output Summary (Last 24 hours) at 10/15/13 1530 Last data filed at 10/15/13 1300  Gross per 24 hour  Intake   1300 ml  Output   1475 ml  Net   -175 ml    Last BM: 6/22  Labs:   Recent Labs Lab 10/13/13 1145 10/14/13 0341 10/14/13 1525  NA 145 139 138  K 3.4* 3.6* 4.2  CL 106 104 104  CO2 28 24 25   BUN 17 14 12   CREATININE 0.88 0.70 0.68  CALCIUM 11.2* 10.4 10.4  GLUCOSE 126* 101* 92    CBG (last 3)   Recent Labs  10/13/13 1126 10/14/13 2004  GLUCAP 139* 79    Scheduled Meds: . ampicillin-sulbactam (UNASYN) IV  3 g Intravenous Q6H  . azithromycin  500 mg Intravenous Q24H  . carbamazepine  400 mg Oral 2 times per day  . carbamazepine  600 mg  Oral Q breakfast  . efavirenz-emtricitabine-tenofovir  1 tablet Oral QHS  . enoxaparin (LOVENOX) injection  40 mg Subcutaneous Q24H  . gabapentin  300 mg Per Tube TID  . megestrol  400 mg Oral q morning - 10a  . PHENobarbital  64.8 mg Oral QHS    Continuous Infusions: . sodium chloride 50 mL/hr at 10/14/13 [place] LDN Clinical Dietitian [phone]

## 2013-10-15 NOTE — Progress Notes (Signed)
Patient MM:ITVIFX D Baity      DOB: October 24, 1955      GXI:[id number]  Message received back from [place] at [facility] that she spoke to the patient's daughter.  She can be available this weekend.  I left a message at the phone number that she uses which is a work number to call our phone. Will continue to facilitate a meeting this weekend.  Melissa L. Lovena Le, MD MBA The Palliative Medicine Team at [facility] Phone: [phone] Pager: [phone]

## 2013-10-15 NOTE — Evaluation (Addendum)
Occupational Therapy Evaluation Patient Details Name: Roger Walton MRN: [id number] DOB: Sep 09, 1955 Today's Date: 10/15/2013    History of Present Illness Pt was admitted for generalized weakness, dehydration and FTT.  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  Receiving XRT.  Pt also has a h/o HIV   Clinical Impression   Pt was admitted for the above.  He will benefit from skilled OT to increase safety and independence with adls to decrease burden of care at next venue.  PLOF is unknown:  Pt reports independent, but chart review looks like family was helping.  Goals are min guard for UB adls and mod A for SPT to 3:1 commode    Follow Up Recommendations  SNF    Equipment Recommendations  3 in 1 bedside comode    Recommendations for Other Services       Precautions / Restrictions Precautions Precautions: Fall Precaution Comments: h/o seizures Restrictions Weight Bearing Restrictions: No      Mobility Bed Mobility Overal bed mobility: Needs Assistance;+ 2 for safety/equipment Bed Mobility: Rolling;Sit to Sidelying;Sit to Supine     Supine to sit: Mod assist Sit to supine: Mod assist   General bed mobility comments: Assist for trunk and bil LEs. Utilized bedpad for scooting, positioning.   Transfers                 General transfer comment: Pt declined to attempts standing/OOB-c/o dizziness/spinning???    Balance Overall balance assessment: Needs assistance Sitting-balance support: Bilateral upper extremity supported;Feet supported Sitting balance-Leahy Scale: Poor Sitting balance - Comments: Initially required Min -Mod assist for sitting balance. Progressd to brief period of Min guard assist however pt is stil unsteady.  Scooted forward with min guard                                    ADL Overall ADL's : Needs assistance/impaired Eating/Feeding: NPO   Grooming: Wash/dry  face;Minimal assistance;Bed level   Upper Body Bathing: Moderate assistance;Bed level   Lower Body Bathing: Maximal assistance;Bed level   Upper Body Dressing : Moderate assistance;Sitting   Lower Body Dressing: Total assistance;Bed level                 General ADL Comments: Pt sat eob and used urinal with max A to hold this.  Stated he didn't feel well and wanted to lie back down.  Pt fearful during mobility.  Used pad to assist to sitting.       Vision                     Perception     Praxis      Pertinent Vitals/Pain No pain reported; per chart, R scapula has chronic pain     Hand Dominance Left   Extremity/Trunk Assessment Upper Extremity Assessment Upper Extremity Assessment: Generalized weakness;RUE deficits/detail;LUE deficits/detail RUE Deficits / Details: ataxic; generalized weakness; uses minimally--attempts to use as assist.  Pt is able to lift arm against gravity LUE Deficits / Details: generalized weakness; ataxic able to use for grooming   Lower Extremity Assessment Lower Extremity Assessment: Generalized weakness;Difficult to assess due to impaired cognition       Communication Communication Communication: No difficulties   Cognition Arousal/Alertness: Awake/alert Behavior During Therapy: WFL for tasks assessed/performed Overall Cognitive Status: No family/caregiver present to determine baseline  cognitive functioning                     General Comments       Exercises       Shoulder Instructions      Home Living Family/patient expects to be discharged to:: Unsure Living Arrangements: Spouse/significant other   Type of Home: House Home Access: Level entry     Home Layout: One level               Home Equipment: None          Prior Functioning/Environment Level of Independence:  (pt reports independent; unsure if accurate.  Per chart, family assisted at home)             OT Diagnosis: Generalized  weakness   OT Problem List: Decreased strength;Decreased activity tolerance;Decreased coordination;Decreased cognition;Decreased knowledge of use of DME or AE;Impaired UE functional use;Impaired balance (sitting and/or standing)   OT Treatment/Interventions: Self-care/ADL training;DME and/or AE instruction;Cognitive remediation/compensation;Balance training;Patient/family education;Therapeutic activities;Therapeutic exercise    OT Goals(Current goals can be found in the care plan section) Acute Rehab OT Goals Patient Stated Goal: none stated; agreeable to working with PT/OT OT Goal Formulation: With patient Time For Goal Achievement: 10/29/13 Potential to Achieve Goals: Good ADL Goals Pt Will Perform Grooming: sitting;with min guard assist (guard NG tube and lines) Pt Will Perform Upper Body Bathing: with min guard assist;sitting (guard NG tube/lines) Pt Will Transfer to Toilet: with mod assist;stand pivot transfer;bedside commode Additional ADL Goal #1: pt will sit eob x 15 minutes with light adl activity to increase strength/endurance for adls Additional ADL Goal #2: pt will use RUE as gross assist for bil ADL tasks  OT Frequency: Min 2X/week   Barriers to D/C:            Co-evaluation PT/OT/SLP Co-Evaluation/Treatment: Yes Reason for Co-Treatment: For patient/therapist safety PT goals addressed during session: Mobility/safety with mobility OT goals addressed during session: ADL's and self-care      End of Session    Activity Tolerance: Patient limited by fatigue Patient left: in bed;with call bell/phone within reach;with bed alarm set   Time: 1253-1313 OT Time Calculation (min): 20 min Charges:  OT General Charges $OT Visit: 1 Procedure OT Evaluation $Initial OT Evaluation Tier I: 1 Procedure OT Treatments $Self Care/Home Management : 8-22 mins G-Codes:    SPENCER,MARYELLEN October 29, 2013, 1:34 PM   Lesle Chris, OTR/L [phone] 2013/10/29

## 2013-10-15 NOTE — Progress Notes (Signed)
[facility] Therapy Treatment Record Phone [phone]   Radiation Therapy was administered to Roger Walton on: 10/15/2013  12:20 PM and was treatment # 4 out of a planned course of 6 treatments.

## 2013-10-15 NOTE — Evaluation (Signed)
Physical Therapy Evaluation Patient Details Name: Roger Walton MRN: [id number] DOB: 1955-12-10 Today's Date: 10/15/2013   History of Present Illness  Pt was admitted for generalized weakness, dehydration and FTT.  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  Receiving XRT.  Pt also has a h/o HIV  Clinical Impression  On eval, pt required Mod assist for mobility-only able to tolerate sitting EOB for a few minutes to use urinal. Pt then c/o dizziness and requested to return to bed. Will likely require +2 assist for OOB/ambulation. Recommend SNF for continued rehab at this time.     Follow Up Recommendations SNF;Supervision/Assistance - 24 hour    Equipment Recommendations   (to be determined)    Recommendations for Other Services OT consult     Precautions / Restrictions Precautions Precautions: Fall Precaution Comments: h/o seizures Restrictions Weight Bearing Restrictions: No      Mobility  Bed Mobility Overal bed mobility: Needs Assistance;+ 2 for safety/equipment Bed Mobility: Rolling;Sit to Sidelying;Sit to Supine     Supine to sit: Mod assist Sit to supine: Mod assist   General bed mobility comments: Assist for trunk and bil LEs. Utilized bedpad for scooting, positioning.   Transfers                 General transfer comment: Pt declined to attempts standing/OOB-c/o dizziness/spinning???  Ambulation/Gait                Stairs            Wheelchair Mobility    Modified Rankin (Stroke Patients Only)       Balance Overall balance assessment: Needs assistance Sitting-balance support: Bilateral upper extremity supported;Feet supported Sitting balance-Leahy Scale: Poor Sitting balance - Comments: Initially required Min -Mod assist for sitting balance. Progressd to brief period of Min guard assist however pt is stil unsteady                                      Pertinent Vitals/Pain Pt grimaced when rolling but did not state in which area of his body he was having pain.     Home Living Family/patient expects to be discharged to:: Unsure Living Arrangements: Spouse/significant other   Type of Home: House Home Access: Level entry     Home Layout: One level Home Equipment: None      Prior Function Level of Independence:  (pt reports independent; unsure if accurate)               Hand Dominance   Dominant Hand: Left    Extremity/Trunk Assessment   Upper Extremity Assessment: Generalized weakness;RUE deficits/detail;LUE deficits/detail RUE Deficits / Details: ataxic; generalized weakness; uses minimally--attempts to use as assist.  Pt is able to lift arm against gravity     LUE Deficits / Details: generalized weakness; ataxic able to use for grooming   Lower Extremity Assessment: Generalized weakness;Difficult to assess due to impaired cognition         Communication   Communication: No difficulties  Cognition Arousal/Alertness: Awake/alert Behavior During Therapy: WFL for tasks assessed/performed Overall Cognitive Status: No family/caregiver present to determine baseline cognitive functioning                      General Comments      Exercises  Assessment/Plan    PT Assessment Patient needs continued PT services  PT Diagnosis Difficulty walking;Generalized weakness;Altered mental status   PT Problem List Decreased strength;Decreased activity tolerance;Decreased balance;Decreased mobility;Decreased cognition;Decreased knowledge of use of DME;Decreased coordination  PT Treatment Interventions Gait training;Functional mobility training;Therapeutic activities;Therapeutic exercise;Patient/family education;Balance training   PT Goals (Current goals can be found in the Care Plan section) Acute Rehab PT Goals Patient Stated Goal: none stated; agreeable to working with PT/OT PT Goal Formulation:  Patient unable to participate in goal setting Time For Goal Achievement: 10/29/13 Potential to Achieve Goals: Fair    Frequency Min 3X/week   Barriers to discharge        Co-evaluation   Reason for Co-Treatment: For patient/therapist safety PT goals addressed during session: Mobility/safety with mobility OT goals addressed during session: ADL's and self-care       End of Session   Activity Tolerance: Patient limited by fatigue;Other (comment) (Limited by pt's report of dizziness) Patient left: in bed;with call bell/phone within reach;with bed alarm set           Time: 5797-2820 PT Time Calculation (min): 19 min   Charges:   PT Evaluation $Initial PT Evaluation Tier I: 1 Procedure     PT G Codes:          Weston Anna, MPT Pager: [phone]

## 2013-10-15 NOTE — Progress Notes (Signed)
[facility] PROGRESS NOTE  Roger Walton GUR:[id number] DOB: 1955/10/20 DOA: 09/22/2013 PCP: Roger Rumpf, MD  Assessment/Plan: Encephalopathy: AMS.  Differential secondary to pain medication vs seizure.  CT head stat negative for bleed. Holding pain medications.  ABG with no hypercapnia.  Neurology consulted. Appreciate neuro help.  Patient more alert, event might be related to opioids.  Fail swallow evaluation. NG tube placement for medications.   PNA, possible aspiration; Patient with cough, fail swallow evaluation, Chest x ray with left lower lobe atelectasis vs consolidation. continue with Unasyn day 2.  azithromycin  for atypical coverage in case of Community acquired.   Adult FTT/Generalized Weakness/Dehydration  -Admit for rehydration purposes.  - B-12 644/TSH 2.3/RPR negative.  -PT/OT evals.   HIV  -Continue antiviral meds. (atripla)   Seizure meds  -Continue phenobarbital.   -Tegretol.  -NG tube in place for medications. Fail swallow evaluation.   Lung Cancer/SCC of left leg.  -Follow with Dr Julien Nordmann.  -received radiation 6-24, 25 left inguinal lymphadenopathy.   Severe Protein-Caloric Malnutrition  -Dietitian consult.  -Boost plus.  -will ask speech to perform swallow evaluation, if fail again will start tube feedings.   DVT Prophylaxis  -Lovenox   Code Status  -Full Code   Code Status: full code.  Family Communication: care discussed with wife.  Disposition Plan: palliative care meeting for goals of care, Code status, nutritions needs, pain management. Transfer to regular bed.    Consultants:  Neurology  Palliative care.   Dr Julien Nordmann.   Procedures:  none  Antibiotics:  Unasyn 6-24.  Azithromycin 6-24.   HPI/Subjective: Alert, following commands, denies pain this morning.  He is more conversant this morning. Oriented to place and situation.   Objective: Filed Vitals:   10/15/13 0520  BP: 107/65  Pulse: 97  Temp:   Resp:  22    Intake/Output Summary (Last 24 hours) at 10/15/13 0815 Last data filed at 10/15/13 0500  Gross per 24 hour  Intake 1969.17 ml  Output   1235 ml  Net 734.17 ml   Filed Weights   10/11/2013 1259 10/13/13 1230 10/15/13 0400  Weight: 52.4 kg (115 lb 8.3 oz) 54.6 kg (120 lb 5.9 oz) 57.4 kg (126 lb 8.7 oz)    Exam:   General: alert, in no distress.   Cardiovascular: S 1, S 2 RRR  Respiratory: Bilateral ronchus.   Abdomen: BS present, soft, NT  Musculoskeletal: no edema. Multiple left inguinal lymphadenopathy.   Neuro: Alert, in no distress, following commands.   Data Reviewed: Basic Metabolic Panel:  Recent Labs Lab 10/14/2013 1605 10/13/13 0407 10/13/13 1145 10/14/13 0341 10/14/13 1525  NA 143 144 145 139 138  K 3.5* 3.3* 3.4* 3.6* 4.2  CL 104 105 106 104 104  CO2 26 26 28 24 25   GLUCOSE 116* 105* 126* 101* 92  BUN 17 16 17 14 12   CREATININE 0.92 0.88 0.88 0.70 0.68  CALCIUM 11.4* 11.2* 11.2* 10.4 10.4   Liver Function Tests:  Recent Labs Lab 10/15/2013 1605  AST 15  ALT 7  ALKPHOS 87  BILITOT 0.3  PROT 7.3  ALBUMIN 2.4*   No results found for this basename: LIPASE, AMYLASE,  in the last 168 hours  Recent Labs Lab 10/13/13 1144  AMMONIA 21   CBC:  Recent Labs Lab 10/04/2013 1605 10/13/13 0407 10/13/13 1145 10/14/13 0341 10/15/13 0345  WBC 11.2* 6.2 8.2 5.8 5.7  HGB 9.3* 10.8* 8.1* 10.0* 10.2*  HCT 27.1* 31.8* 23.6* 29.7* 29.5*  MCV 98.2 97.8 99.2 100.0 97.7  PLT 299 246 322 229 245   Cardiac Enzymes: No results found for this basename: CKTOTAL, CKMB, CKMBINDEX, TROPONINI,  in the last 168 hours BNP (last 3 results) No results found for this basename: PROBNP,  in the last 8760 hours CBG:  Recent Labs Lab 10/13/13 1126 10/14/13 2004  GLUCAP 139* 79    Recent Results (from the past 240 hour(s))  MRSA PCR SCREENING     Status: None   Collection Time    09/28/2013  7:00 PM      Result Value Ref Range Status   MRSA by PCR NEGATIVE   NEGATIVE Final   Comment:            The GeneXpert MRSA Assay (FDA     approved for NASAL specimens     only), is one component of a     comprehensive MRSA colonization     surveillance program. It is not     intended to diagnose MRSA     infection nor to guide or     monitor treatment for     MRSA infections.  URINE CULTURE     Status: None   Collection Time    10/13/13 10:34 PM      Result Value Ref Range Status   Specimen Description URINE, RANDOM   Final   Special Requests NONE   Final   Culture  Setup Time     Final   Value: 10/14/2013 01:12     Performed at [facility]     Final   Value: NO GROWTH     Performed at [facility]   Culture     Final   Value: NO GROWTH     Performed at [facility]   Report Status 10/14/2013 FINAL   Final     Studies: Dg Abd 1 View  10/14/2013   CLINICAL DATA:  Nasogastric tube placement.  EXAM: ABDOMEN - 1 VIEW  COMPARISON:  10/14/2013 at 12:53 p.m.  FINDINGS: Nasogastric tube tip is in the stomach fundus with side port at the gastroesophageal junction.  Indistinct hemidiaphragm secondary to motion artifact. Heart size within normal limits.  IMPRESSION: 1. Nasogastric tube tip is in the stomach fundus with side port at the gastroesophageal junction.   Electronically Signed   By: Sherryl Barters M.D.   On: 10/14/2013 22:35   Dg Abd 1 View  10/14/2013   CLINICAL DATA:  Nasogastric tube placement.  EXAM: ABDOMEN - 1 VIEW  COMPARISON:  None.  FINDINGS: The bowel gas pattern is normal. Nasogastric tube tip is seen in proximal stomach. No radio-opaque calculi or other significant radiographic abnormality are seen.  IMPRESSION: Nasogastric tube tip seen in proximal stomach. No evidence of bowel obstruction or ileus.   Electronically Signed   By: Sabino Dick M.D.   On: 10/14/2013 13:06   Ct Head Wo Contrast  10/13/2013   CLINICAL DATA:  Altered mental status. Scleral cell carcinoma. HIV.  EXAM: CT HEAD WITHOUT  CONTRAST  TECHNIQUE: Contiguous axial images were obtained from the base of the skull through the vertex without intravenous contrast.  COMPARISON:  08/30/2013  FINDINGS: No acute intracranial abnormality. Specifically, no hemorrhage, hydrocephalus, mass lesion, acute infarction, or significant intracranial injury. No acute calvarial abnormality. Visualized paranasal sinuses and mastoids clear. Orbital soft tissues unremarkable.  IMPRESSION: No acute intracranial abnormality.   Electronically Signed   By: Rolm Baptise M.D.  On: 10/13/2013 12:32   Dg Chest Port 1 View  10/13/2013   CLINICAL DATA:  Hypoxemia  EXAM: PORTABLE CHEST - 1 VIEW  COMPARISON:  09/26/2013  FINDINGS: Focal airspace opacity in the left lower lobe. Possible small left effusion. No confluent opacity on the right. Hyperinflation compatible with COPD. Heart is normal size. No acute bony abnormality.  IMPRESSION: Left lower lobe atelectasis or consolidation with small left effusion.   Electronically Signed   By: Rolm Baptise M.D.   On: 10/13/2013 15:52    Scheduled Meds: . ampicillin-sulbactam (UNASYN) IV  3 g Intravenous Q6H  . azithromycin  500 mg Intravenous Q24H  . carbamazepine  400 mg Oral 2 times per day  . carbamazepine  600 mg Oral Q breakfast  . efavirenz-emtricitabine-tenofovir  1 tablet Oral QHS  . enoxaparin (LOVENOX) injection  40 mg Subcutaneous Q24H  . gabapentin  300 mg Per Tube TID  . megestrol  400 mg Oral q morning - 10a  . PHENobarbital  64.8 mg Oral QHS   Continuous Infusions: . 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     Niel Hummer A  [facility] Pager [phone]. If 7PM-7AM, please contact night-coverage at [email or web address], password [facility] 10/15/2013, 8:15 AM  LOS: 3 days

## 2013-10-15 NOTE — Progress Notes (Signed)
Subjective: Patient has no complaints.  Confusion and lethargy resolved.   Objective: Current vital signs: BP 107/65  Pulse 97  Temp(Src) 98.7 F (37.1 C) (Oral)  Resp 22  Ht 6\' 1"  (1.854 m)  Wt 57.4 kg (126 lb 8.7 oz)  BMI 16.70 kg/m2  SpO2 100% Vital signs in last 24 hours: Temp:  [97.8 F (36.6 C)-99.6 F (37.6 C)] 98.7 F (37.1 C) (06/26 0400) Pulse Rate:  [87-98] 97 (06/26 0520) Resp:  [13-27] 22 (06/26 0520) BP: (98-117)/(63-79) 107/65 mmHg (06/26 0520) SpO2:  [96 %-100 %] 100 % (06/26 0520) Weight:  [57.4 kg (126 lb 8.7 oz)] 57.4 kg (126 lb 8.7 oz) (06/26 0400)  Intake/Output from previous day: 06/25 0701 - 06/26 0700 In: 2044.2 [I.V.:1294.2; NG/GT:100; IV Piggyback:650] Out: 6283 [Urine:1235] Intake/Output this shift: Total I/O In: 150 [I.V.:50; IV Piggyback:100] Out: 200 [Urine:200] Nutritional status: NPO  Neurologic Exam: Mental Status:  Alert, oriented, thought content appropriate. Speech dysarthric due to lack of teeth without evidence of aphasia. Able to follow 3 step commands without difficulty.  Cranial Nerves:  II: Visual fields grossly normal, pupils equal, round, reactive to light and accommodation  III,IV, VI: ptosis not present, extra-ocular motions intact bilaterally  V,VII: smile symmetric, facial light touch sensation normal bilaterally  VIII: hearing normal bilaterally  IX,X: gag reflex present  XI: bilateral shoulder shrug  XII: midline tongue extension without atrophy or fasciculations  Motor:  5/5 throughout Sensory: Pinprick and light touch intact throughout, bilaterally  Deep Tendon Reflexes:  2+ bilaterl UE 2+ KJ and no AJ Plantars:  Right: mute  Left: downgoing  Cerebellar:  normal finger-to-nose, normal heel-to-shin test  Gait: not able to test  CV: pulses palpable throughout    Lab Results: Basic Metabolic Panel:  Recent Labs Lab 09/20/2013 1605 10/13/13 0407 10/13/13 1145 10/14/13 0341 10/14/13 1525  NA 143 144 145  139 138  K 3.5* 3.3* 3.4* 3.6* 4.2  CL 104 105 106 104 104  CO2 26 26 28 24 25   GLUCOSE 116* 105* 126* 101* 92  BUN 17 16 17 14 12   CREATININE 0.92 0.88 0.88 0.70 0.68  CALCIUM 11.4* 11.2* 11.2* 10.4 10.4    Liver Function Tests:  Recent Labs Lab 09/25/2013 1605  AST 15  ALT 7  ALKPHOS 87  BILITOT 0.3  PROT 7.3  ALBUMIN 2.4*   No results found for this basename: LIPASE, AMYLASE,  in the last 168 hours  Recent Labs Lab 10/13/13 1144  AMMONIA 21    CBC:  Recent Labs Lab 10/15/2013 1605 10/13/13 0407 10/13/13 1145 10/14/13 0341 10/15/13 0345  WBC 11.2* 6.2 8.2 5.8 5.7  HGB 9.3* 10.8* 8.1* 10.0* 10.2*  HCT 27.1* 31.8* 23.6* 29.7* 29.5*  MCV 98.2 97.8 99.2 100.0 97.7  PLT 299 246 322 229 245    Cardiac Enzymes: No results found for this basename: CKTOTAL, CKMB, CKMBINDEX, TROPONINI,  in the last 168 hours  Lipid Panel: No results found for this basename: CHOL, TRIG, HDL, CHOLHDL, VLDL, LDLCALC,  in the last 168 hours  CBG:  Recent Labs Lab 10/13/13 1126 10/14/13 2004  GLUCAP 139* 35    Microbiology: Results for orders placed during the hospital encounter of 10/02/2013  MRSA PCR SCREENING     Status: None   Collection Time    10/07/2013  7:00 PM      Result Value Ref Range Status   MRSA by PCR NEGATIVE  NEGATIVE Final   Comment:  The GeneXpert MRSA Assay (FDA     approved for NASAL specimens     only), is one component of a     comprehensive MRSA colonization     surveillance program. It is not     intended to diagnose MRSA     infection nor to guide or     monitor treatment for     MRSA infections.  URINE CULTURE     Status: None   Collection Time    10/13/13 10:34 PM      Result Value Ref Range Status   Specimen Description URINE, RANDOM   Final   Special Requests NONE   Final   Culture  Setup Time     Final   Value: 10/14/2013 01:12     Performed at [place]     Final   Value: NO GROWTH     Performed at  [facility]   Culture     Final   Value: NO GROWTH     Performed at [facility]   Report Status 10/14/2013 FINAL   Final    Coagulation Studies: No results found for this basename: LABPROT, INR,  in the last 72 hours  Imaging: Dg Abd 1 View  10/14/2013   CLINICAL DATA:  Nasogastric tube placement.  EXAM: ABDOMEN - 1 VIEW  COMPARISON:  10/14/2013 at 12:53 p.m.  FINDINGS: Nasogastric tube tip is in the stomach fundus with side port at the gastroesophageal junction.  Indistinct hemidiaphragm secondary to motion artifact. Heart size within normal limits.  IMPRESSION: 1. Nasogastric tube tip is in the stomach fundus with side port at the gastroesophageal junction.   Electronically Signed   By: Sherryl Barters M.D.   On: 10/14/2013 22:35   Dg Abd 1 View  10/14/2013   CLINICAL DATA:  Nasogastric tube placement.  EXAM: ABDOMEN - 1 VIEW  COMPARISON:  None.  FINDINGS: The bowel gas pattern is normal. Nasogastric tube tip is seen in proximal stomach. No radio-opaque calculi or other significant radiographic abnormality are seen.  IMPRESSION: Nasogastric tube tip seen in proximal stomach. No evidence of bowel obstruction or ileus.   Electronically Signed   By: Sabino Dick M.D.   On: 10/14/2013 13:06   Ct Head Wo Contrast  10/13/2013   CLINICAL DATA:  Altered mental status. Scleral cell carcinoma. HIV.  EXAM: CT HEAD WITHOUT CONTRAST  TECHNIQUE: Contiguous axial images were obtained from the base of the skull through the vertex without intravenous contrast.  COMPARISON:  08/30/2013  FINDINGS: No acute intracranial abnormality. Specifically, no hemorrhage, hydrocephalus, mass lesion, acute infarction, or significant intracranial injury. No acute calvarial abnormality. Visualized paranasal sinuses and mastoids clear. Orbital soft tissues unremarkable.  IMPRESSION: No acute intracranial abnormality.   Electronically Signed   By: Rolm Baptise M.D.   On: 10/13/2013 12:32   Dg Chest Port 1  View  10/13/2013   CLINICAL DATA:  Hypoxemia  EXAM: PORTABLE CHEST - 1 VIEW  COMPARISON:  09/26/2013  FINDINGS: Focal airspace opacity in the left lower lobe. Possible small left effusion. No confluent opacity on the right. Hyperinflation compatible with COPD. Heart is normal size. No acute bony abnormality.  IMPRESSION: Left lower lobe atelectasis or consolidation with small left effusion.   Electronically Signed   By: Rolm Baptise M.D.   On: 10/13/2013 15:52    Medications:  Scheduled: . ampicillin-sulbactam (UNASYN) IV  3 g Intravenous Q6H  . azithromycin  500  mg Intravenous Q24H  . carbamazepine  400 mg Oral 2 times per day  . carbamazepine  600 mg Oral Q breakfast  . efavirenz-emtricitabine-tenofovir  1 tablet Oral QHS  . enoxaparin (LOVENOX) injection  40 mg Subcutaneous Q24H  . gabapentin  300 mg Per Tube TID  . megestrol  400 mg Oral q morning - 10a  . PHENobarbital  64.8 mg Oral QHS    Assessment/Plan: Lethargy has resolved at this time.  Currently has NG tube due to swallowing concerns.   Recommend: 1) Continue AED 2) continue to minimize sedating medications.     Neurology S/O   Etta Quill PA-C [facility] [phone]  10/15/2013, 9:13 AM

## 2013-10-15 NOTE — Progress Notes (Signed)
Speech Language Pathology Treatment: Dysphagia  Patient Details Name: Roger Walton MRN: [id number] DOB: 1956/03/14 Today's Date: 10/15/2013 Time: 1696-7893 SLP Time Calculation (min): 17 min  Assessment / Plan / Recommendation Clinical Impression  Pt today with improved mental status per chart review.  He has larger bore NG in place that may negatively impact epiglottic deflection/airway protection.  Pt noted to have viscous white tinged secretions in posterior right side of oral cavity without awareness.  Verbal cues and use of moist green toothettes effective to help pt to expectorate secretions.    Pt did present with coughing after oral moistening concerning for aspiration.  Cough was strong with expectoration of viscous blood tinged and white secretions.    SLP administered a single bite of applesauce - delayed swallow noted without overt indications of immediate aspiration - however delayed cough continued.    As pt has likely impact of right recurrent laryngeal nerve that is responsible for subglottic sensation and cervical esophageal muscle contraction, recommend consider pursuing MBS (WITHOUT LARGE BORE NG) to allow instrumental swallow evaluation.  Note palliative referral pending that may impact pt's desired care plan.    Concern is present for premorbid dysphagia/aspiration without adequate sensation due to laryngeal nerve impact.      Recommend npo except single ice chips after oral care for hygiene and comfort.   Pt educated to recommendations and was agreeable.       HPI HPI: Roger Walton is an 58 y.o. male with multiple medical co-morbidities including HIV, seizure disorder, small cell lung cancer followed by Dr. Julien Nordmann and a SCC of the left posterior thigh followed by Dr. Lucia Gaskins. Was in the ED prior to admission for FTT and was discharged home with [facility] therapies. Presents to see Dr. Julien Nordmann on 10/13/2010 where he was found to be clinically dehydrated and that has prompted a direct  admission to the hospital. Wife states they are no longer able to care for him at home and would like to consider SNF placement. Pt noted to become significantly lethargic and developed trouble swallowing after being given oxycontin. RN reprots his voice is wet and he is coughing up thick secretions. Pt needs to have seizure medication given today at 1200 or NG tube will need to be placed. CT chest on 6/8 reports Secretions are seen within the mainstem bronchi bilaterally, suspicious for aspiration. There is mild patchy airspace disease seen in the superior segments of the lower lobes bilaterally, aspiration pneumonitis. There is no history of pt being seen by SLP. There is a pending meeting for palliative care.    Pertinent Vitals Afebrile, decreased  SLP Plan  Continue with current plan of care    Recommendations Diet recommendations: NPO (except single ice chips after oral care) Medication Administration: Via alternative means              Oral Care Recommendations: Oral care Q4 per protocol;Oral care prior to ice chips Plan: Continue with current plan of care    [place], [place], [place] [facility] SLP [phone]

## 2013-10-15 NOTE — Progress Notes (Signed)
Patient VX:UCJARW D Mandella      DOB: February 21, 1956      PTY:[id number]  Received call 10 pm last evening from nurse stating family will arrive at 4 pm for [facility] meeting. Unfortunately , that slot was already scheduled.  Will contact this am to see if they are will to talk at 6 pm, vs in the am.    Shellby Schlink L. Lovena Le, MD MBA The Palliative Medicine Team at [facility] Phone: [phone] Pager: [phone]

## 2013-10-16 ENCOUNTER — Inpatient Hospital Stay (HOSPITAL_COMMUNITY): Payer: Medicare Other

## 2013-10-16 DIAGNOSIS — J690 Pneumonitis due to inhalation of food and vomit: Secondary | ICD-10-CM

## 2013-10-16 LAB — BASIC METABOLIC PANEL
BUN: 9 mg/dL (ref 6–23)
CHLORIDE: 100 meq/L (ref 96–112)
CO2: 26 mEq/L (ref 19–32)
Calcium: 10 mg/dL (ref 8.4–10.5)
Creatinine, Ser: 0.75 mg/dL (ref 0.50–1.35)
GFR calc non Af Amer: >90 mL/min (ref >90)
Glucose, Bld: 103 mg/dL — ABNORMAL HIGH (ref 70–99)
Potassium: 3.1 mEq/L — ABNORMAL LOW (ref 3.7–5.3)
SODIUM: 136 meq/L — AB (ref 137–147)

## 2013-10-16 LAB — CBC
HEMATOCRIT: 26.5 % — AB (ref 39.0–52.0)
Hemoglobin: 9.1 g/dL — ABNORMAL LOW (ref 13.0–17.0)
MCH: 33.3 pg (ref 26.0–34.0)
MCHC: 34.3 g/dL (ref 30.0–36.0)
MCV: 97.1 fL (ref 78.0–100.0)
PLATELETS: 245 10*3/uL (ref 150–400)
RBC: 2.73 MIL/uL — ABNORMAL LOW (ref 4.22–5.81)
RDW: 13.2 % (ref 11.5–15.5)
WBC: 5 10*3/uL (ref 4.0–10.5)

## 2013-10-16 MED ORDER — RESOURCE THICKENUP CLEAR PO POWD
ORAL | Status: DC | PRN
  Filled 2013-10-16: qty 125

## 2013-10-16 MED ORDER — POTASSIUM CHLORIDE CRYS ER 20 MEQ PO TBCR
40.0000 meq | EXTENDED_RELEASE_TABLET | Freq: Once | ORAL | Status: AC
  Administered 2013-10-16: 40 meq via ORAL
  Filled 2013-10-16: qty 2

## 2013-10-16 NOTE — Progress Notes (Signed)
Patient incontinent of urine. Refuses condom catheter.

## 2013-10-16 NOTE — Progress Notes (Signed)
While patient being assisted on to bedpan by NT, patient reached up and pulled out NGT at 0635. Patient refused to have it reinserted. Stating "I don't need it and I don't want it."  Told patient and his spouse to discuss with MD about it. Then he asked where the doctor was.

## 2013-10-16 NOTE — Procedures (Signed)
Objective Swallowing Evaluation: Modified Barium Swallowing Study  Patient Details  Name: Roger Walton MRN: [id number] Date of Birth: 01/12/1956  Today's Date: 10/16/2013 Time: 1035-1100 SLP Time Calculation (min): 25 min  Past Medical History:  Past Medical History  Diagnosis Date  . HIV infection dx'd 2008  . SOB (shortness of breath)   . Chronic pain in right shoulder     scapula and arm for 2 months   . Anxiety   . Protein-calorie malnutrition   . Tegretol toxicity     history of  . 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 Grand mal seizure 03/04/12  . Epileptic seizures     "I've had them all my life" (08/24/2013)   Past Surgical History:  Past Surgical History  Procedure Laterality Date  . Fine needle aspiration    . Skin cancer excision Left 08/24/2013    wide excision cancer thigh/buttocks  . Wound debridement Left 06/24/2013    posterior thigh/notes 06/24/2013  . Melanoma excision Left 08/24/2013    Procedure: wide excision cancer of left buttock / leg ;  Surgeon: Shann Medal, MD;  Location: [place];  Service: General;  Laterality: Left;   HPI:  Roger Walton is an 58 y.o. male with multiple medical co-morbidities including HIV, seizure disorder, small cell lung cancer followed by Dr. Julien Nordmann and a SCC of the left posterior thigh followed by Dr. Lucia Gaskins. Was in the ED prior to admission for FTT and was discharged home with [facility] therapies. Presents to see Dr. Julien Nordmann on 10/13/2010 where he was found to be clinically dehydrated and that has prompted a direct admission to the hospital. Wife states they are no longer able to care for him at home and would like to consider SNF placement. Pt noted to become significantly lethargic and developed trouble swallowing after being given oxycontin. RN reprots his voice is wet and he is coughing up thick secretions. Pt needs to have seizure medication given today  at 1200 or NG tube will need to be placed. CT chest on 6/8 reports Secretions are seen within the mainstem bronchi bilaterally, suspicious for aspiration. There is mild patchy airspace disease seen in the superior segments of the lower lobes bilaterally, aspiration pneumonitis. There is no history of pt being seen by SLP. There is a pending meeting for palliative care.      Assessment / Plan / Recommendation Clinical Impression  Dysphagia Diagnosis: Mild oral phase dysphagia;Moderate pharyngeal phase dysphagia Clinical impression: Pt presents with a mild oral dysphagia with slow transit and base of tongue weakness with mild lingual/base of tongue residuals that spill to the valleculae post swallow. Oropharyngeal dysphagia is primarily sensory based with a moderate motor weakness. Pts swallow initiation is delayed, leading to a pooling of liquids in the pyriforms with moderate to large sized boluses, and aspiration of that liquid as the swallow is initaited, with thin and nectar straw. There is also mildly decreased laryngeal elevation and cricopharyngeal opening with pyriform residuals. When pt does aspirate, it is silent. Cues for coughing and throat clearing result in expectoration of tracheal mucous which mixes with penetrate/aspirate and is not fully removed.    Pt will be at risk of aspiration with all PO due to sensory deficits, generalized weakness, secretions and progressive decline. Severity of aspiration may be mitigated by a modified diet of dys 2 (fine chopped) solids and nectar thick  liquids, given with small sips. Pt should be cued to swallow twice. Informed RN that pt may take necessary meds whole in puree today, will defer writing diet to MD, family and palliative care team given his meeting today.     Treatment Recommendation  Therapy as outlined in treatment plan below    Diet Recommendation Dysphagia 2 (Fine chop);Nectar-thick liquid   Liquid Administration via: Cup;No  straw Medication Administration: Whole meds with puree Supervision: Staff to assist with self feeding;Full supervision/cueing for compensatory strategies Compensations: Slow rate;Small sips/bites;Multiple dry swallows after each bite/sip Postural Changes and/or Swallow Maneuvers: Seated upright 90 degrees;Upright 30-60 min after meal    Other  Recommendations Oral Care Recommendations: Oral care Q4 per protocol Other Recommendations: Order thickener from pharmacy;Have oral suction available   Follow Up Recommendations  Skilled Nursing facility    Frequency and Duration min 2x/week  2 weeks   Pertinent Vitals/Pain NA    SLP Swallow Goals     General HPI: Roger Walton is an 58 y.o. male with multiple medical co-morbidities including HIV, seizure disorder, small cell lung cancer followed by Dr. Julien Nordmann and a SCC of the left posterior thigh followed by Dr. Lucia Gaskins. Was in the ED prior to admission for FTT and was discharged home with [facility] therapies. Presents to see Dr. Julien Nordmann on 10/13/2010 where he was found to be clinically dehydrated and that has prompted a direct admission to the hospital. Wife states they are no longer able to care for him at home and would like to consider SNF placement. Pt noted to become significantly lethargic and developed trouble swallowing after being given oxycontin. RN reprots his voice is wet and he is coughing up thick secretions. Pt needs to have seizure medication given today at 1200 or NG tube will need to be placed. CT chest on 6/8 reports Secretions are seen within the mainstem bronchi bilaterally, suspicious for aspiration. There is mild patchy airspace disease seen in the superior segments of the lower lobes bilaterally, aspiration pneumonitis. There is no history of pt being seen by SLP. There is a pending meeting for palliative care.  Type of Study: Modified Barium Swallowing Study Reason for Referral: Objectively evaluate swallowing function Previous  Swallow Assessment: none Diet Prior to this Study: NPO Temperature Spikes Noted: No Respiratory Status: Room air History of Recent Intubation: No Behavior/Cognition: Alert;Cooperative;Pleasant mood Oral Cavity - Dentition: Edentulous (few lower teeth) Oral Motor / Sensory Function: Within functional limits Self-Feeding Abilities: Able to feed self Patient Positioning: Upright in chair Baseline Vocal Quality: Wet Volitional Cough: Congested;Weak;Wet Volitional Swallow: Able to elicit Anatomy: Within functional limits Pharyngeal Secretions:  (tracheal secretions, mix with barium post aspiration)    Reason for Referral Objectively evaluate swallowing function   Oral Phase Oral Preparation/Oral Phase Oral Phase: Impaired Oral - Nectar Oral - Nectar Cup: Weak lingual manipulation;Delayed oral transit;Lingual/palatal residue Oral - Nectar Straw: Weak lingual manipulation;Delayed oral transit;Lingual/palatal residue Oral - Thin Oral - Thin Cup: Weak lingual manipulation;Delayed oral transit;Lingual/palatal residue Oral - Thin Straw: Weak lingual manipulation;Delayed oral transit;Lingual/palatal residue Oral - Solids Oral - Puree: Weak lingual manipulation;Delayed oral transit Oral - Mechanical Soft: Weak lingual manipulation;Delayed oral transit;Impaired mastication (poor dentition) Oral - Pill: Delayed oral transit   Pharyngeal Phase Pharyngeal Phase Pharyngeal Phase: Impaired Pharyngeal - Nectar Pharyngeal - Nectar Cup: Delayed swallow initiation;Premature spillage to pyriform sinuses;Reduced tongue base retraction;Reduced laryngeal elevation;Pharyngeal residue - pyriform sinuses;Pharyngeal residue - valleculae Pharyngeal - Nectar Straw: Delayed swallow initiation;Premature spillage to pyriform sinuses;Reduced  tongue base retraction;Reduced laryngeal elevation;Pharyngeal residue - pyriform sinuses;Pharyngeal residue - valleculae;Penetration/Aspiration during  swallow Penetration/Aspiration details (nectar straw): Material enters airway, passes BELOW cords without attempt by patient to eject out (silent aspiration) Pharyngeal - Thin Pharyngeal - Thin Cup: Delayed swallow initiation;Premature spillage to pyriform sinuses;Reduced tongue base retraction;Reduced laryngeal elevation;Pharyngeal residue - pyriform sinuses;Pharyngeal residue - valleculae;Penetration/Aspiration during swallow;Penetration/Aspiration before swallow Penetration/Aspiration details (thin cup): Material enters airway, passes BELOW cords without attempt by patient to eject out (silent aspiration) Pharyngeal - Thin Straw: Delayed swallow initiation;Premature spillage to pyriform sinuses;Reduced tongue base retraction;Reduced laryngeal elevation;Pharyngeal residue - pyriform sinuses;Pharyngeal residue - valleculae;Penetration/Aspiration during swallow (with a chin tuck) Penetration/Aspiration details (thin straw): Material enters airway, passes BELOW cords without attempt by patient to eject out (silent aspiration) Pharyngeal - Solids Pharyngeal - Puree: Delayed swallow initiation;Premature spillage to valleculae;Reduced anterior laryngeal mobility;Reduced tongue base retraction;Pharyngeal residue - valleculae Pharyngeal - Mechanical Soft: Delayed swallow initiation;Premature spillage to valleculae;Reduced anterior laryngeal mobility;Reduced tongue base retraction;Pharyngeal residue - valleculae Pharyngeal - Pill: Delayed swallow initiation;Premature spillage to valleculae;Reduced anterior laryngeal mobility;Reduced tongue base retraction;Pharyngeal residue - valleculae  Cervical Esophageal Phase    GO             Herbie Baltimore, MA CCC-SLP [phone]  Lynann Beaver 10/16/2013, 11:25 AM

## 2013-10-16 NOTE — Progress Notes (Signed)
[facility] PROGRESS NOTE  TION TSE XNT:[id number] DOB: 03-04-1956 DOA: 10/09/2013 PCP: Bobby Rumpf, MD  Assessment/Plan: Encephalopathy: AMS. Patient was very lethargic, not following command on 6-25. Differential secondary to pain medication vs seizure.  CT head stat negative for bleed. Holding pain medications.  ABG with no hypercapnia.  Neurology consulted. Appreciate neuro help.    Dysphagia: Fail swallow evaluation. NG tube placed for medications. Patient removed NG tube 6-26. Speech recommended Dysphagia 2 diet. Patient is at risk for aspiration. He demanded to eat. Started on diet. Await Palliative care meeting.    PNA, possible aspiration; Patient with cough, fail swallow evaluation, Chest x ray with left lower lobe atelectasis vs consolidation. continue with Unasyn day 3.  azithromycin  for atypical coverage in case of Community acquired.   Adult FTT/Generalized Weakness/Dehydration  -Admit for rehydration purposes.  - B-12 644/TSH 2.3/RPR negative.  -PT/OT evals.  -Needs SNF.   HIV  -Continue antiviral meds. (atripla)   Seizure meds  -Continue phenobarbital.   -Tegretol.    Lung Cancer/SCC of left leg.  -Follow with Dr Julien Nordmann.  -received radiation 6-24, 25 left inguinal lymphadenopathy.   Severe Protein-Caloric Malnutrition  -Dietitian consult.  -Boost plus.    Hypokalemia; will replete with 40 Meq.   DVT Prophylaxis  -Lovenox   Code Status  -Full Code   Code Status: full code.  Family Communication: care discussed with wife.  Disposition Plan: palliative care meeting for goals of care, Code status, nutritions needs, pain management.    Consultants:  Neurology  Palliative care.   Dr Julien Nordmann.   Procedures:  none  Antibiotics:  Unasyn 6-24.  Azithromycin 6-24.   HPI/Subjective: He is alert, he is using bad words, cursing. He wants to eat " if you don't give me food I will leave this hospital".   Objective: Filed  Vitals:   10/16/13 0410  BP: 100/56  Pulse: 98  Temp: 99.2 F (37.3 C)  Resp: 16    Intake/Output Summary (Last 24 hours) at 10/16/13 1247 Last data filed at 10/16/13 1014  [facility] per 24 hour  Intake 1772.67 ml  Output    550 ml  Net 1222.67 ml   Filed Weights   10/15/2013 1259 10/13/13 1230 10/15/13 0400  Weight: 52.4 kg (115 lb 8.3 oz) 54.6 kg (120 lb 5.9 oz) 57.4 kg (126 lb 8.7 oz)    Exam:   General: alert, in no distress.   Cardiovascular: S 1, S 2 RRR  Respiratory: Bilateral ronchus.   Abdomen: BS present, soft, NT  Musculoskeletal: no edema. Multiple left inguinal lymphadenopathy.   Neuro: Alert, in no distress, following commands.   Data Reviewed: Basic Metabolic Panel:  Recent Labs Lab 10/13/13 0407 10/13/13 1145 10/14/13 0341 10/14/13 1525 10/16/13 0413  NA 144 145 139 138 136*  K 3.3* 3.4* 3.6* 4.2 3.1*  CL 105 106 104 104 100  CO2 26 28 24 25 26   GLUCOSE 105* 126* 101* 92 103*  BUN 16 17 14 12 9   CREATININE 0.88 0.88 0.70 0.68 0.75  CALCIUM 11.2* 11.2* 10.4 10.4 10.0   Liver Function Tests:  Recent Labs Lab 10/07/2013 1605  AST 15  ALT 7  ALKPHOS 87  BILITOT 0.3  PROT 7.3  ALBUMIN 2.4*   No results found for this basename: LIPASE, AMYLASE,  in the last 168 hours  Recent Labs Lab 10/13/13 1144  AMMONIA 21   CBC:  Recent Labs Lab 10/13/13 0407 10/13/13 1145 10/14/13 0341 10/15/13 0345 10/16/13  0413  WBC 6.2 8.2 5.8 5.7 5.0  HGB 10.8* 8.1* 10.0* 10.2* 9.1*  HCT 31.8* 23.6* 29.7* 29.5* 26.5*  MCV 97.8 99.2 100.0 97.7 97.1  PLT 246 322 229 245 245   Cardiac Enzymes: No results found for this basename: CKTOTAL, CKMB, CKMBINDEX, TROPONINI,  in the last 168 hours BNP (last 3 results) No results found for this basename: PROBNP,  in the last 8760 hours CBG:  Recent Labs Lab 10/13/13 1126 10/14/13 2004  GLUCAP 139* 79    Recent Results (from the past 240 hour(s))  MRSA PCR SCREENING     Status: None   Collection Time     10/08/2013  7:00 PM      Result Value Ref Range Status   MRSA by PCR NEGATIVE  NEGATIVE Final   Comment:            The GeneXpert MRSA Assay (FDA     approved for NASAL specimens     only), is one component of a     comprehensive MRSA colonization     surveillance program. It is not     intended to diagnose MRSA     infection nor to guide or     monitor treatment for     MRSA infections.  URINE CULTURE     Status: None   Collection Time    10/13/13 10:34 PM      Result Value Ref Range Status   Specimen Description URINE, RANDOM   Final   Special Requests NONE   Final   Culture  Setup Time     Final   Value: 10/14/2013 01:12     Performed at [facility]     Final   Value: NO GROWTH     Performed at [facility]   Culture     Final   Value: NO GROWTH     Performed at [facility]   Report Status 10/14/2013 FINAL   Final     Studies: Dg Abd 1 View  10/14/2013   CLINICAL DATA:  Nasogastric tube placement.  EXAM: ABDOMEN - 1 VIEW  COMPARISON:  10/14/2013 at 12:53 p.m.  FINDINGS: Nasogastric tube tip is in the stomach fundus with side port at the gastroesophageal junction.  Indistinct hemidiaphragm secondary to motion artifact. Heart size within normal limits.  IMPRESSION: 1. Nasogastric tube tip is in the stomach fundus with side port at the gastroesophageal junction.   Electronically Signed   By: Sherryl Barters M.D.   On: 10/14/2013 22:35   Dg Abd 1 View  10/14/2013   CLINICAL DATA:  Nasogastric tube placement.  EXAM: ABDOMEN - 1 VIEW  COMPARISON:  None.  FINDINGS: The bowel gas pattern is normal. Nasogastric tube tip is seen in proximal stomach. No radio-opaque calculi or other significant radiographic abnormality are seen.  IMPRESSION: Nasogastric tube tip seen in proximal stomach. No evidence of bowel obstruction or ileus.   Electronically Signed   By: Sabino Dick M.D.   On: 10/14/2013 13:06   Dg Swallowing Func-speech  Pathology  10/16/2013   Katherene Ponto Deblois, CCC-SLP     10/16/2013 11:27 AM Objective Swallowing Evaluation: Modified Barium Swallowing Study   Patient Details  Name: Roger Walton MRN: [id number] Date of Birth: February 15, 1956  Today's Date: 10/16/2013 Time: 1035-1100 SLP Time Calculation (min): 25 min  Past Medical History:  Past Medical History  Diagnosis Date  . HIV infection dx'd 2008  . SOB (  shortness of breath)   . Chronic pain in right shoulder     scapula and arm for 2 months   . Anxiety   . Protein-calorie malnutrition   . Tegretol toxicity     history of  . 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 Grand mal seizure 03/04/12  . Epileptic seizures     "I've had them all my life" (08/24/2013)   Past Surgical History:  Past Surgical History  Procedure Laterality Date  . Fine needle aspiration    . Skin cancer excision Left 08/24/2013    wide excision cancer thigh/buttocks  . Wound debridement Left 06/24/2013    posterior thigh/notes 06/24/2013  . Melanoma excision Left 08/24/2013    Procedure: wide excision cancer of left buttock / leg ;   Surgeon: Shann Medal, MD;  Location: [facility];  Service: General;   Laterality: Left;   HPI:  Roger Walton is an 58 y.o. male with multiple medical  co-morbidities including HIV, seizure disorder, small cell lung  cancer followed by Dr. Julien Nordmann and a SCC of the left posterior  thigh followed by Dr. Lucia Gaskins. Was in the ED prior to admission  for FTT and was discharged home with [facility] therapies. Presents to  see Dr. Julien Nordmann on 10/13/2010 where he was found to be clinically  dehydrated and that has prompted a direct admission to the  hospital. Wife states they are no longer able to care for him at  home and would like to consider SNF placement. Pt noted to become  significantly lethargic and developed trouble swallowing after  being given oxycontin. RN reprots his voice is wet and he is  coughing up thick  secretions. Pt needs to have seizure medication  given today at 1200 or NG tube will need to be placed. CT chest  on 6/8 reports Secretions are seen within the mainstem bronchi  bilaterally, suspicious for aspiration. There is mild patchy  airspace disease seen in the superior segments of the lower lobes  bilaterally, aspiration pneumonitis. There is no history of pt  being seen by SLP. There is a pending meeting for palliative  care.      Assessment / Plan / Recommendation Clinical Impression  Dysphagia Diagnosis: Mild oral phase dysphagia;Moderate  pharyngeal phase dysphagia Clinical impression: Pt presents with a mild oral dysphagia with  slow transit and base of tongue weakness with mild lingual/base  of tongue residuals that spill to the valleculae post swallow.  Oropharyngeal dysphagia is primarily sensory based with a  moderate motor weakness. Pts swallow initiation is delayed,  leading to a pooling of liquids in the pyriforms with moderate to  large sized boluses, and aspiration of that liquid as the swallow  is initaited, with thin and nectar straw. There is also mildly  decreased laryngeal elevation and cricopharyngeal opening with  pyriform residuals. When pt does aspirate, it is silent. Cues for  coughing and throat clearing result in expectoration of tracheal  mucous which mixes with penetrate/aspirate and is not fully  removed.    Pt will be at risk of aspiration with all PO due to sensory  deficits, generalized weakness, secretions and progressive  decline. Severity of aspiration may be mitigated by a modified  diet of dys 2 (fine chopped) solids and nectar thick liquids,  given with small sips. Pt should be cued to swallow twice.  Informed RN that  pt may take necessary meds whole in puree today,  will defer writing diet to MD, family and palliative care team  given his meeting today.     Treatment Recommendation  Therapy as outlined in treatment plan below    Diet Recommendation Dysphagia 2 (Fine  chop);Nectar-thick liquid   Liquid Administration via: Cup;No straw Medication Administration: Whole meds with puree Supervision: Staff to assist with self feeding;Full  supervision/cueing for compensatory strategies Compensations: Slow rate;Small sips/bites;Multiple dry swallows  after each bite/sip Postural Changes and/or Swallow Maneuvers: Seated upright 90  degrees;Upright 30-60 min after meal    Other  Recommendations Oral Care Recommendations: Oral care Q4  per protocol Other Recommendations: Order thickener from pharmacy;Have oral  suction available   Follow Up Recommendations  Skilled Nursing facility    Frequency and Duration min 2x/week  2 weeks   Pertinent Vitals/Pain NA    SLP Swallow Goals     General HPI: Roger Walton is an 59 y.o. male with multiple  medical co-morbidities including HIV, seizure disorder, small  cell lung cancer followed by Dr. Julien Nordmann and a SCC of the left  posterior thigh followed by Dr. Lucia Gaskins. Was in the ED prior to  admission for FTT and was discharged home with [facility] therapies.  Presents to see Dr. Julien Nordmann on 10/13/2010 where he was found to be  clinically dehydrated and that has prompted a direct admission to  the hospital. Wife states they are no longer able to care for him  at home and would like to consider SNF placement. Pt noted to  become significantly lethargic and developed trouble swallowing  after being given oxycontin. RN reprots his voice is wet and he  is coughing up thick secretions. Pt needs to have seizure  medication given today at 1200 or NG tube will need to be placed.  CT chest on 6/8 reports Secretions are seen within the mainstem  bronchi bilaterally, suspicious for aspiration. There is mild  patchy airspace disease seen in the superior segments of the  lower lobes bilaterally, aspiration pneumonitis. There is no  history of pt being seen by SLP. There is a pending meeting for  palliative care.  Type of Study: Modified Barium Swallowing Study Reason for  Referral: Objectively evaluate swallowing function Previous Swallow Assessment: none Diet Prior to this Study: NPO Temperature Spikes Noted: No Respiratory Status: Room air History of Recent Intubation: No Behavior/Cognition: Alert;Cooperative;Pleasant mood Oral Cavity - Dentition: Edentulous (few lower teeth) Oral Motor / Sensory Function: Within functional limits Self-Feeding Abilities: Able to feed self Patient Positioning: Upright in chair Baseline Vocal Quality: Wet Volitional Cough: Congested;Weak;Wet Volitional Swallow: Able to elicit Anatomy: Within functional limits Pharyngeal Secretions:  (tracheal secretions, mix with barium  post aspiration)    Reason for Referral Objectively evaluate swallowing function   Oral Phase Oral Preparation/Oral Phase Oral Phase: Impaired Oral - Nectar Oral - Nectar Cup: Weak lingual manipulation;Delayed oral  transit;Lingual/palatal residue Oral - Nectar Straw: Weak lingual manipulation;Delayed oral  transit;Lingual/palatal residue Oral - Thin Oral - Thin Cup: Weak lingual manipulation;Delayed oral  transit;Lingual/palatal residue Oral - Thin Straw: Weak lingual manipulation;Delayed oral  transit;Lingual/palatal residue Oral - Solids Oral - Puree: Weak lingual manipulation;Delayed oral transit Oral - Mechanical Soft: Weak lingual manipulation;Delayed oral  transit;Impaired mastication (poor dentition) Oral - Pill: Delayed oral transit   Pharyngeal Phase Pharyngeal Phase Pharyngeal Phase: Impaired Pharyngeal - Nectar Pharyngeal - Nectar Cup: Delayed swallow initiation;Premature  spillage to pyriform sinuses;Reduced tongue base  retraction;Reduced laryngeal elevation;Pharyngeal  residue -  pyriform sinuses;Pharyngeal residue - valleculae Pharyngeal - Nectar Straw: Delayed swallow initiation;Premature  spillage to pyriform sinuses;Reduced tongue base  retraction;Reduced laryngeal elevation;Pharyngeal residue -  pyriform sinuses;Pharyngeal residue -   valleculae;Penetration/Aspiration during swallow Penetration/Aspiration details (nectar straw): Material enters  airway, passes BELOW cords without attempt by patient to eject  out (silent aspiration) Pharyngeal - Thin Pharyngeal - Thin Cup: Delayed swallow initiation;Premature  spillage to pyriform sinuses;Reduced tongue base  retraction;Reduced laryngeal elevation;Pharyngeal residue -  pyriform sinuses;Pharyngeal residue -  valleculae;Penetration/Aspiration during  swallow;Penetration/Aspiration before swallow Penetration/Aspiration details (thin cup): Material enters  airway, passes BELOW cords without attempt by patient to eject  out (silent aspiration) Pharyngeal - Thin Straw: Delayed swallow initiation;Premature  spillage to pyriform sinuses;Reduced tongue base  retraction;Reduced laryngeal elevation;Pharyngeal residue -  pyriform sinuses;Pharyngeal residue -  valleculae;Penetration/Aspiration during swallow (with a chin  tuck) Penetration/Aspiration details (thin straw): Material enters  airway, passes BELOW cords without attempt by patient to eject  out (silent aspiration) Pharyngeal - Solids Pharyngeal - Puree: Delayed swallow initiation;Premature spillage  to valleculae;Reduced anterior laryngeal mobility;Reduced tongue  base retraction;Pharyngeal residue - valleculae Pharyngeal - Mechanical Soft: Delayed swallow  initiation;Premature spillage to valleculae;Reduced anterior  laryngeal mobility;Reduced tongue base retraction;Pharyngeal  residue - valleculae Pharyngeal - Pill: Delayed swallow initiation;Premature spillage  to valleculae;Reduced anterior laryngeal mobility;Reduced tongue  base retraction;Pharyngeal residue - valleculae  Cervical Esophageal Phase    GO             Herbie Baltimore, MA CCC-SLP [phone]  DeBlois, Katherene Ponto 10/16/2013, 11:25 AM     Scheduled Meds: . ampicillin-sulbactam (UNASYN) IV  3 g Intravenous Q6H  . azithromycin  500 mg Intravenous Q24H  . carbamazepine  400 mg  Oral 2 times per day  . carbamazepine  600 mg Oral Q breakfast  . efavirenz-emtricitabine-tenofovir  1 tablet Oral QHS  . enoxaparin (LOVENOX) injection  40 mg Subcutaneous Q24H  . gabapentin  300 mg Per Tube TID  . megestrol  400 mg Oral q morning - 10a  . PHENobarbital  64.8 mg Oral QHS   Continuous Infusions: . 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     Niel Hummer A  [facility] Pager [phone]. If 7PM-7AM, please contact night-coverage at [email or web address], password [facility] 10/16/2013, 12:47 PM  LOS: 4 days

## 2013-10-16 NOTE — Progress Notes (Signed)
Gave patient medications crushed with apple juice via NGT. Patient c/o feeling "hungry".

## 2013-10-16 NOTE — Progress Notes (Signed)
Patient Roger Walton      DOB: Jun 24, 1955      HOZ:[id number]  Attempted to hold goals of care with patient who at first would not communicate and then proceeded to curse me out. I did not even get to introduce a topic . I was able to  gather from my interaction that he does not and will not go to a nursing home, and that he wants to talk to his doctors whom he identifies as Dr. Earlie Server and Dr. Tammi Klippel. He has obvious memory deficits which impair his judgement and insight.  I will attempt one more time to work with him tomorrow.  He told me that he did not want include his daughter Maree Erie and was verbally abusive to his wife during the meeting.   Melissa L. Lovena Le, MD MBA The Palliative Medicine Team at [facility] Phone: [phone] Pager: [phone]

## 2013-10-16 NOTE — Progress Notes (Signed)
ANTIBIOTIC CONSULT NOTE - Follow Up  Pharmacy Consult for:  Unasyn Indication:  Pneumonia, suspect aspiration  No Known Allergies  Patient Measurements: Height: 6\' 1"  (185.4 cm) Weight: 126 lb 8.7 oz (57.4 kg) IBW/kg (Calculated) : 79.9   Vital Signs: Temp: 99.2 F (37.3 C) (06/27 0410) Temp src: Oral (06/27 0410) BP: 100/56 mmHg (06/27 0410) Pulse Rate: 98 (06/27 0410) Intake/Output from previous day: 06/26 0701 - 06/27 0700 In: 1922.7 [I.V.:1282.7; NG/GT:240; IV Piggyback:400] Out: 825 [Urine:825] Intake/Output from this shift: Total I/O In: 0  Out: 100 [Urine:100]  Labs:  Recent Labs  10/14/13 0341 10/14/13 1525 10/15/13 0345 10/16/13 0413  WBC 5.8  --  5.7 5.0  HGB 10.0*  --  10.2* 9.1*  PLT 229  --  245 245  CREATININE 0.70 0.68  --  0.75   Estimated Creatinine Clearance: 79.7 ml/min (by C-G formula based on Cr of 0.75).    Microbiology: Recent Results (from the past 720 hour(s))  MRSA PCR SCREENING     Status: None   Collection Time    09/25/2013  7:00 PM      Result Value Ref Range Status   MRSA by PCR NEGATIVE  NEGATIVE Final   Comment:            The GeneXpert MRSA Assay (FDA     approved for NASAL specimens     only), is one component of a     comprehensive MRSA colonization     surveillance program. It is not     intended to diagnose MRSA     infection nor to guide or     monitor treatment for     MRSA infections.  URINE CULTURE     Status: None   Collection Time    10/13/13 10:34 PM      Result Value Ref Range Status   Specimen Description URINE, RANDOM   Final   Special Requests NONE   Final   Culture  Setup Time     Final   Value: 10/14/2013 01:12     Performed at [facility]     Final   Value: NO GROWTH     Performed at [facility]   Culture     Final   Value: NO GROWTH     Performed at [facility]   Report Status 10/14/2013 FINAL   Final    Medical History: Past Medical History   Diagnosis Date  . HIV infection dx'd 2008  . SOB (shortness of breath)   . Chronic pain in right shoulder     scapula and arm for 2 months   . Anxiety   . Protein-calorie malnutrition   . Tegretol toxicity     history of  . 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 Grand mal seizure 03/04/12  . Epileptic seizures     "I've had them all my life" (08/24/2013)    Medications:  Scheduled:  . ampicillin-sulbactam (UNASYN) IV  3 g Intravenous Q6H  . azithromycin  500 mg Intravenous Q24H  . carbamazepine  400 mg Oral 2 times per day  . carbamazepine  600 mg Oral Q breakfast  . efavirenz-emtricitabine-tenofovir  1 tablet Oral QHS  . enoxaparin (LOVENOX) injection  40 mg Subcutaneous Q24H  . gabapentin  300 mg Per Tube TID  . megestrol  400 mg  Oral q morning - 10a  . PHENobarbital  64.8 mg Oral QHS   Assessment: Asked to assist with Unasyn therapy for this 58 year-old male with pneumonia, and suspected aspiration. Under the care of Dr. Julien Nordmann for metastatic non-small cell lung cancer.  Receiving palliative radiotherapy to enlarged inguinal lymphadenopathy.  Not a candidate for systemic chemotherapy.  6/24 >> Unasyn >> 6/24 >> Zithromax x 5 days >>   Afebrile  WBC WNL  SCr 0.75, CrCl 80 ml/min  Goal of Therapy:  Eradication of infection  Plan:  1) Continue Unasyn 3 grams IV every 6 hours. 2) Will sign off 3) Change Unasyn to Augmentin when appropriate   Adrian Saran, PharmD, BCPS Pager [phone] 10/16/2013 10:47 AM

## 2013-10-17 ENCOUNTER — Inpatient Hospital Stay (HOSPITAL_COMMUNITY): Payer: Medicare Other

## 2013-10-17 ENCOUNTER — Encounter (HOSPITAL_COMMUNITY): Payer: Medicare Other | Admitting: Certified Registered"

## 2013-10-17 ENCOUNTER — Inpatient Hospital Stay (HOSPITAL_COMMUNITY): Payer: Medicare Other | Admitting: Certified Registered"

## 2013-10-17 DIAGNOSIS — G9340 Encephalopathy, unspecified: Secondary | ICD-10-CM

## 2013-10-17 DIAGNOSIS — D0470 Carcinoma in situ of skin of unspecified lower limb, including hip: Secondary | ICD-10-CM

## 2013-10-17 DIAGNOSIS — R4020 Unspecified coma: Secondary | ICD-10-CM

## 2013-10-17 DIAGNOSIS — R627 Adult failure to thrive: Secondary | ICD-10-CM

## 2013-10-17 DIAGNOSIS — Z515 Encounter for palliative care: Secondary | ICD-10-CM

## 2013-10-17 DIAGNOSIS — J690 Pneumonitis due to inhalation of food and vomit: Secondary | ICD-10-CM

## 2013-10-17 DIAGNOSIS — J9600 Acute respiratory failure, unspecified whether with hypoxia or hypercapnia: Secondary | ICD-10-CM

## 2013-10-17 DIAGNOSIS — I498 Other specified cardiac arrhythmias: Secondary | ICD-10-CM

## 2013-10-17 LAB — CBC WITH DIFFERENTIAL/PLATELET
Basophils Absolute: 0 10*3/uL (ref 0.0–0.1)
Basophils Relative: 0 % (ref 0–1)
EOS ABS: 0 10*3/uL (ref 0.0–0.7)
Eosinophils Relative: 0 % (ref 0–5)
HCT: 25.8 % — ABNORMAL LOW (ref 39.0–52.0)
Hemoglobin: 9.2 g/dL — ABNORMAL LOW (ref 13.0–17.0)
LYMPHS PCT: 13 % (ref 12–46)
Lymphs Abs: 1.4 10*3/uL (ref 0.7–4.0)
MCH: 33.8 pg (ref 26.0–34.0)
MCHC: 35.7 g/dL (ref 30.0–36.0)
MCV: 94.9 fL (ref 78.0–100.0)
Monocytes Absolute: 0.6 10*3/uL (ref 0.1–1.0)
Monocytes Relative: 5 % (ref 3–12)
Neutro Abs: 8.8 10*3/uL — ABNORMAL HIGH (ref 1.7–7.7)
Neutrophils Relative %: 82 % — ABNORMAL HIGH (ref 43–77)
PLATELETS: 267 10*3/uL (ref 150–400)
RBC: 2.72 MIL/uL — ABNORMAL LOW (ref 4.22–5.81)
RDW: 13.1 % (ref 11.5–15.5)
WBC: 10.9 10*3/uL — AB (ref 4.0–10.5)

## 2013-10-17 LAB — BASIC METABOLIC PANEL
BUN: 8 mg/dL (ref 6–23)
CHLORIDE: 102 meq/L (ref 96–112)
CO2: 24 meq/L (ref 19–32)
Calcium: 10 mg/dL (ref 8.4–10.5)
Creatinine, Ser: 0.73 mg/dL (ref 0.50–1.35)
GFR calc non Af Amer: >90 mL/min (ref >90)
Glucose, Bld: 95 mg/dL (ref 70–99)
Potassium: 3.4 mEq/L — ABNORMAL LOW (ref 3.7–5.3)
SODIUM: 137 meq/L (ref 137–147)

## 2013-10-17 LAB — GLUCOSE, CAPILLARY: GLUCOSE-CAPILLARY: 166 mg/dL — AB (ref 70–99)

## 2013-10-17 LAB — TROPONIN I: Troponin I: 0.7 ng/mL (ref <0.30)

## 2013-10-17 MED ORDER — FENTANYL CITRATE 0.05 MG/ML IJ SOLN
INTRAMUSCULAR | Status: AC
  Filled 2013-10-17: qty 2

## 2013-10-17 MED ORDER — SODIUM CHLORIDE 0.9 % IV SOLN
0.0000 ug/h | INTRAVENOUS | Status: DC
  Administered 2013-10-17: 50 ug/h via INTRAVENOUS
  Filled 2013-10-17: qty 50

## 2013-10-17 MED ORDER — MIDAZOLAM HCL 2 MG/2ML IJ SOLN: 2.0000 mg | Freq: Once | INTRAMUSCULAR | Status: AC

## 2013-10-17 MED ORDER — FENTANYL CITRATE 0.05 MG/ML IJ SOLN
100.0000 ug | Freq: Once | INTRAMUSCULAR | Status: AC
  Administered 2013-10-17: 100 ug via INTRAVENOUS

## 2013-10-17 MED ORDER — MIDAZOLAM HCL 2 MG/2ML IJ SOLN
2.0000 mg | Freq: Once | INTRAMUSCULAR | Status: AC
  Administered 2013-10-17: 2 mg via INTRAVENOUS

## 2013-10-17 MED ORDER — FENTANYL CITRATE 0.05 MG/ML IJ SOLN: 50.0000 ug | Freq: Once | INTRAMUSCULAR | Status: AC

## 2013-10-17 MED ORDER — FENTANYL BOLUS VIA INFUSION
50.0000 ug | INTRAVENOUS | Status: DC | PRN
  Filled 2013-10-17: qty 100

## 2013-10-17 MED ORDER — SODIUM CHLORIDE 0.9 % IV SOLN
0.0000 mg/h | INTRAVENOUS | Status: DC
  Administered 2013-10-17: 1 mg/h via INTRAVENOUS
  Filled 2013-10-17: qty 10

## 2013-10-17 MED ORDER — MIDAZOLAM HCL 2 MG/2ML IJ SOLN: 4.0000 mg | Freq: Once | INTRAMUSCULAR | Status: AC

## 2013-10-17 MED ORDER — PANTOPRAZOLE SODIUM 40 MG IV SOLR: 40.0000 mg | Freq: Every day | INTRAVENOUS | Status: DC

## 2013-10-17 MED ORDER — MIDAZOLAM HCL 2 MG/2ML IJ SOLN: 1.0000 mg | INTRAMUSCULAR | Status: DC | PRN

## 2013-10-17 MED FILL — Medication: Qty: 1 | Status: AC

## 2013-10-18 ENCOUNTER — Ambulatory Visit: Payer: Medicare Other

## 2013-10-18 ENCOUNTER — Ambulatory Visit: Payer: Medicare Other | Admitting: Radiation Oncology

## 2013-10-18 LAB — COMPREHENSIVE METABOLIC PANEL
ALT: 18 U/L (ref 0–53)
AST: 53 U/L — AB (ref 0–37)
Albumin: 2 g/dL — ABNORMAL LOW (ref 3.5–5.2)
Alkaline Phosphatase: 134 U/L — ABNORMAL HIGH (ref 39–117)
BUN: 11 mg/dL (ref 6–23)
CALCIUM: 10 mg/dL (ref 8.4–10.5)
CO2: 15 meq/L — AB (ref 19–32)
CREATININE: 1.1 mg/dL (ref 0.50–1.35)
Chloride: 101 mEq/L (ref 96–112)
GFR calc Af Amer: 82 mL/min — ABNORMAL LOW (ref >90)
GFR calc non Af Amer: 71 mL/min — ABNORMAL LOW (ref >90)
Glucose, Bld: 235 mg/dL — ABNORMAL HIGH (ref 70–99)
Potassium: 4.8 mEq/L (ref 3.7–5.3)
SODIUM: 136 meq/L — AB (ref 137–147)
TOTAL PROTEIN: 6.1 g/dL (ref 6.0–8.3)
Total Bilirubin: 0.3 mg/dL (ref 0.3–1.2)

## 2013-10-18 NOTE — Accreditation Note (Signed)
o Restraints not reported to CMS Pursuant to regulation 482.13 (G) (3) use of soft wrist restraints was logged on (06.29.2015 at [place]) by Evette Cristal, RN, Patient Financial controller).

## 2013-10-19 ENCOUNTER — Ambulatory Visit: Payer: Medicare Other

## 2013-10-19 NOTE — Progress Notes (Signed)
  Radiation Oncology         [phone] ________________________________  Name: Roger Walton MRN: [id number]  Date: 10/15/2013  DOB: [birth year]  End of Treatment Note  Diagnosis:   1. Lung cancer, upper lobe 162.3  2. Painful Left Inguinal Nodal Metastases. 196.5   Indication for treatment:  Palliation of pain       Radiation treatment dates:   10/15/2013-10/15/2013  Site/dose:   The painful adenopathy in the left inguinal region was treated to 16 Gy in 4 fractions.  Beams/energy:   A five field technique was employed with a combination of 6, 10, and 15 MV X-rays conformally covering the target volume.  Narrative: The patient tolerated radiation treatment relatively well, but, his overall clinical status rapidly declined with dehydration requiring hospital admission, and the patient expired during his hospitalization.  ________________________________  Sheral Apley Tammi Klippel, M.D.

## 2013-10-20 ENCOUNTER — Encounter (INDEPENDENT_AMBULATORY_CARE_PROVIDER_SITE_OTHER): Payer: Medicare Other | Admitting: Surgery

## 2013-10-20 ENCOUNTER — Ambulatory Visit: Payer: Medicare Other | Admitting: Radiation Oncology

## 2013-10-20 ENCOUNTER — Ambulatory Visit: Admission: RE | Admit: 2013-10-20 | Payer: Medicare Other | Source: Ambulatory Visit

## 2013-10-20 NOTE — Progress Notes (Signed)
28 ml of Versed 1mg /82ml drip wasted in sink and  210 ml of fentanyl drip from 69mcg/ml concentration also wasted in sink. Witnessed by Nelva Bush RN

## 2013-10-20 NOTE — Consult Note (Signed)
Patient Roger Walton      DOB: 09-07-1955      DXI:[id number]     Consult Note from the Palliative Medicine Team at [facility] Requested by: Dr. Tyrell Antonio    PCP: Bobby Rumpf, MD Reason for Consultation: [place]    Phone [phone]  Assessment of patients Current state: 58 yr old african [place] male with small cell lung cancer and SCC of the left thigh admitted with dehydration.  Patient was uncooperative with initial goals of care.  He had asserted full code status.  This am patient became unresponsive in front of his primary attending.  He underwent Code Blue with ROSC showing bradycardia .  He was transferred intubated and paced to the ICU .  It is apparent that Roger Walton will not survive this hospital stay.  I was able to meet with his family at the bedside to explain the situation.  His spouse and family understand that he is not alive at this time as he is not able to maintain a pulse with his paced rhythm.  She my note from the day service.  We were able to liberate him from the pacer and the vent and provide emotional support at the time of death.   Goals of Care: 1.  Code Status: No further CPR, support on vent and Pacer but do not escalate care. Status has transitioned to full comfort. Patient experiencing PEA on Pacer.  Family excepting withdraw of pacer and Vent.   2. Scope of Treatment:  Family focusing on comfort care at this time.   4. Disposition: Expected hospital death.   3. Symptom Management:   1. Anxiety/Agitation:nothing needed 2. Pain: no pain , patient in circulatory arrest   4. Psychosocial: Family is fractured by remarriage.  Two daughters had been estranged but are now present. Spouse Roger Walton supportive.  5. Spiritual: Family has spiritual support at the bedside.        Patient Documents Completed or Given: Document Given Completed  Advanced Directives Pkt    MOST    DNR    Gone from My Sight    Hard Choices      Brief HPI:  58 yr old african [place] male with history of HIV, Seizure disorder, small cell lung cancer, SCC of left posterior thigh. Patient was admitted with FTT, dehydration and weakness.  We had been asked to see him for goals of care but he had been resisting the interaction. Today he acutely decompensated and I have engaged his family for formal goals of care surrounding his grave condition.   ROS: unable due to unresponsive    PMH:  Past Medical History  Diagnosis Date  . HIV infection dx'd 2008  . SOB (shortness of breath)   . Chronic pain in right shoulder     scapula and arm for 2 months   . Anxiety   . Protein-calorie malnutrition   . Tegretol toxicity     history of  . 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 Grand mal seizure 03/04/12  . Epileptic seizures     "I've had them all my life" (08/24/2013)     PSH: Past Surgical History  Procedure Laterality Date  . Fine needle aspiration    . Skin cancer excision Left 08/24/2013    wide excision cancer thigh/buttocks  . Wound debridement Left 06/24/2013    posterior thigh/notes  06/24/2013  . Melanoma excision Left 08/24/2013    Procedure: wide excision cancer of left buttock / leg ;  Surgeon: Shann Medal, MD;  Location: [facility];  Service: General;  Laterality: Left;   I have reviewed the [place] and [facility] and  If appropriate update it with new information. No Known Allergies Scheduled Meds: . ampicillin-sulbactam (UNASYN) IV  3 g Intravenous Q6H  . azithromycin  500 mg Intravenous Q24H  . carbamazepine  400 mg Oral 2 times per day  . carbamazepine  600 mg Oral Q breakfast  . efavirenz-emtricitabine-tenofovir  1 tablet Oral QHS  . enoxaparin (LOVENOX) injection  40 mg Subcutaneous Q24H  . gabapentin  300 mg Per Tube TID  . megestrol  400 mg Oral q morning - 10a  . pantoprazole (PROTONIX) IV  40 mg Intravenous Daily  . PHENobarbital  64.8 mg Oral QHS    Continuous Infusions: . fentaNYL infusion INTRAVENOUS 50 mcg/hr (2013-10-26 1500)  . midazolam (VERSED) infusion 2 mg/hr (26-Oct-2013 1500)   PRN Meds:.acetaminophen, acetaminophen, fentaNYL, midazolam, ondansetron (ZOFRAN) IV, ondansetron, RESOURCE THICKENUP CLEAR, senna-docusate    BP 77/26  Pulse 97  Temp(Src) 98.8 F (37.1 C) (Oral)  Resp 14  Ht 6\' 1"  (1.854 m)  Wt 57.4 kg (126 lb 8.7 oz)  BMI 16.70 kg/m2  SpO2 23%   PPS : 5   Intake/Output Summary (Last 24 hours) at 2013-10-26 1558 Last data filed at 10-26-13 1500  Gross per 24 hour  Intake 718.12 ml  Output    525 ml  Net 193.12 ml    Physical Exam:  General: Unresponsvie, vented HEENT:  Pupil not examined, mm dry, poor dentition Chest:   Decreased with poor air entry CVS: brady, paced Abdomen:schapoid, soft not tender Ext: cool, thin wasted, left thigh mass evident Neuro:unreponsive on no sedation  Labs: CBC    Component Value Date/Time   WBC 10.9* 10-26-13 1102   WBC 3.1* 08/09/2013 1340   RBC 2.72* Oct 26, 2013 1102   RBC 3.48* 08/09/2013 1340   HGB 9.2* 2013-10-26 1102   HGB 12.2* 08/09/2013 1340   HCT 25.8* 26-Oct-2013 1102   HCT 35.6* 08/09/2013 1340   PLT 267 26-Oct-2013 1102   PLT 280 08/09/2013 1340   MCV 94.9 10/26/2013 1102   MCV 102.3* 08/09/2013 1340   MCH 33.8 2013-10-26 1102   MCH 35.1* 08/09/2013 1340   MCHC 35.7 Oct 26, 2013 1102   MCHC 34.3 08/09/2013 1340   RDW 13.1 2013/10/26 1102   RDW 13.8 08/09/2013 1340   LYMPHSABS 1.4 10-26-13 1102   LYMPHSABS 0.6* 08/09/2013 1340   MONOABS 0.6 10-26-13 1102   MONOABS 0.6 08/09/2013 1340   EOSABS 0.0 10/26/2013 1102   EOSABS 0.0 08/09/2013 1340   BASOSABS 0.0 10-26-13 1102   BASOSABS 0.0 08/09/2013 1340     CMP     Component Value Date/Time   NA 136* 10-26-2013 1102   NA 132* 08/09/2013 1340   K 4.8 26-Oct-2013 1102   K 4.2 08/09/2013 1340   CL 101 10/26/13 1102   CL 103 08/24/2012 1129   CO2 15* 10/26/13 1102   CO2 23 08/09/2013 1340   GLUCOSE 235*  Oct 26, 2013 1102   GLUCOSE 126 08/09/2013 1340   GLUCOSE 103* 08/24/2012 1129   BUN 11 10/26/13 1102   BUN 10.0 08/09/2013 1340   CREATININE 1.10 10/26/2013 1102   CREATININE 0.9 08/09/2013 1340   CREATININE 0.95 05/05/2013 1530   CALCIUM 10.0 10/26/13 1102   CALCIUM 9.9 08/09/2013 1340  PROT 6.1 Nov 03, 2013 1102   PROT 7.9 08/09/2013 1340   ALBUMIN 2.0* 03-Nov-2013 1102   ALBUMIN 3.2* 08/09/2013 1340   AST 53* 2013/11/03 1102   AST 20 08/09/2013 1340   ALT 18 11-03-13 1102   ALT 10 08/09/2013 1340   ALKPHOS 134* Nov 03, 2013 1102   ALKPHOS 91 08/09/2013 1340   BILITOT 0.3 03-Nov-2013 1102   BILITOT 0.30 08/09/2013 1340   GFRNONAA 71* 11/03/2013 1102   GFRNONAA >60 01/07/2011 1002   GFRAA 82* 11/03/13 1102   GFRAA >60 01/07/2011 1002    Chest Xray Reviewed/Impressions Endotracheal tube terminating 5.8 cm above carina. There may be a  second catheter projecting adjacent to this. This is incompletely  imaged and could alternatively be external to the patient.  Cardiomegaly and chronic interstitial thickening. Given overlying  artifact degradation in the left hemi thorax, no definite acute  cardiopulmonary disease     Time In Time Out Total Time Spent with Patient Total Overall Time  200 pm 330 pm 60 min 90 min    Greater than 50%  of this time was spent counseling and coordinating care related to the above assessment and plan.    Melissa L. Lovena Le, MD MBA The Palliative Medicine Team at [facility] Phone: [phone] Pager: [phone]

## 2013-10-20 NOTE — Progress Notes (Signed)
Dr. Tyrell Antonio found pt in bed without respirations, called a code  And started CPR. Pt  's BP returned and  he was some what alert and diaphoretic. He again coded and the code team took over. Pt was transferred to ICU and family was notified.

## 2013-10-20 NOTE — Progress Notes (Signed)
Not able to doppler femoral, radial, carotid pulse. External pacer still pacing. Dr. Lovena Le at bedside and support provided to family. Pacer and ventilator turned off at 1523. TOD 1523 and verified with Billey Chang, MD

## 2013-10-20 NOTE — Significant Event (Signed)
Rapid Response Event Note  Overview:   Event Type: Cardiac  Initial Focused Assessment:  Pt unresponsive and with agonal resp, assisted with BVM by resp therapy. Placed on monitor with CHB. External pacing started, given 2 amps Atropine. Intubated by CRNA.  Dr Elsworth Soho and Dr Tyrell Antonio present. Transferred to ICU rm 1235.   Interventions: see code sheet   Event Summary:   at      at          Gwynn Burly

## 2013-10-20 NOTE — Consult Note (Signed)
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, FTT, AIDS (CD4 100 04/2013 ), with bradycardic arrest - CHB, no response to atropine, requiring external pacing  SIGNIFICANT EVENTS / STUDIES:  6/23 hospital admission for dehydration, hypotension and failure to thrive 6/25 acute encephalopathy, head CT negative, neurology consulting 6/28 bradycardia, cardiac arrest, complete heart block, no response to atropine, external pacing  LINES / TUBES: ETT 6/28 >>   CULTURES: resp 6/28 >>  ANTIBIOTICS: unasyn 6/25 >>  HISTORY OF PRESENT ILLNESS:  58/M NSCLC  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 He was on palliative RT, hospice was offered on last oncology visit. He has received concurrent chemotherapy RT in the past without significant response. Recent imaging has shown stable disease in the chest but disease progression in his left psoas and rectus muscle as well as left thigh muscles with necrotic lymphadenopathy in the left inguinal and femoral regions. He was admitted on 6/23 from the oncology clinic for dehydration and hypotension and failure to thrive. He was noted to be encephalopathic for the last 2 days, head CT negative, neurology was consulted. He was being treated for aspiration pneumonia with Unasyn. Narrative conversation was ongoing with the patient and family - these were reviewed He is noted to have obvious memory deficits which 'impair his judgment and insight '   PAST MEDICAL HISTORY :  Past  Medical History  Diagnosis Date  . HIV infection dx'd 2008  . SOB (shortness of breath)   . Chronic pain in right shoulder     scapula and arm for 2 months   . Anxiety   . Protein-calorie malnutrition   . Tegretol toxicity     history of  . 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 Grand mal seizure 03/04/12  . Epileptic seizures     "I've had them all my life" (08/24/2013)   Past Surgical History  Procedure Laterality Date  . Fine needle aspiration    . Skin cancer excision Left 08/24/2013    wide excision cancer thigh/buttocks  . Wound debridement Left 06/24/2013    posterior thigh/notes 06/24/2013  . Melanoma excision Left 08/24/2013    Procedure: wide excision cancer of left buttock / leg ;  Surgeon: Shann Medal, MD;  Location: [place];  Service: General;  Laterality: Left;   Prior to Admission medications   Medication Sig Start Date End Date Taking? Authorizing Provider  carbamazepine (TEGRETOL) 200 MG tablet Take 400-600 mg by mouth 3 (three) times daily. Take 3 tabs (600mg ) in the morning, 2 tabs (400mg ) at noon, and 2 tabs (400mg ) at bedtime.   Yes Historical Provider, MD  efavirenz-emtricitabine-tenofovir (ATRIPLA) 600-200-300 MG per tablet Take 1 tablet by mouth at bedtime.   Yes Historical Provider, MD  gabapentin (NEURONTIN) 300 MG capsule Take 300 mg by mouth 3 (three) times daily.   Yes Historical Provider, MD  megestrol (MEGACE) 40 MG/ML suspension Take 400 mg  by mouth every morning.   Yes Historical Provider, MD  oxyCODONE (OXY IR/ROXICODONE) 5 MG immediate release tablet Take 5 mg by mouth every 4 (four) hours as needed for severe pain.   Yes Historical Provider, MD  OxyCODONE (OXYCONTIN) 15 mg T12A 12 hr tablet Take 15 mg by mouth every 12 (twelve) hours.   Yes Historical Provider, MD  PHENobarbital (LUMINAL) 64.8 MG tablet Take 64.8 mg by mouth at bedtime.   Yes Historical Provider, MD    No Known Allergies  FAMILY HISTORY:  Family History  Problem Relation Age of Onset  . Colon cancer Neg Hx    SOCIAL HISTORY:  reports that he has quit smoking. His smoking use included Cigarettes. He has a 43 pack-year smoking history. He has never used smokeless tobacco. He reports that he drinks about 3.6 ounces of alcohol per week. He reports that he uses illicit drugs (Marijuana).  REVIEW OF SYSTEMS:  Unable to obtain  SUBJECTIVE:   VITAL SIGNS: Temp:  [97.5 F (36.4 C)-99.6 F (37.6 C)] 99.6 F (37.6 C) (06/28 0612) Pulse Rate:  [94-96] 96 (06/28 0612) Resp:  [16-20] 18 (06/28 1035) BP: (97-106)/(35-65) 97/35 mmHg (06/28 1035) SpO2:  [93 %-97 %] 97 % (06/28 0612) FiO2 (%):  [100 %] 100 % (06/28 1035) HEMODYNAMICS:   VENTILATOR SETTINGS: Vent Mode:  [-] PRVC FiO2 (%):  [100 %] 100 % Set Rate:  [14 bmp] 14 bmp Vt Set:  [600 mL] 600 mL PEEP:  [5 cmH20] 5 cmH20 Plateau Pressure:  [11 cmH20] 11 cmH20 INTAKE / OUTPUT: Intake/Output     06/27 0701 - 06/28 0700 06/28 0701 - 06/29 0700   P.O. 480    I.V. (mL/kg)     NG/GT     IV Piggyback 450    Total Intake(mL/kg) 930 (16.2)    Urine (mL/kg/hr) 650 (0.5) 100 (0.5)   Total Output 650 100   Net +280 -100        Urine Occurrence 4 x    Stool Occurrence 5 x      PHYSICAL EXAMINATION: Gen. Chronically ill-appearing, cachectic ENT - no lesions, no post nasal drip Neck: No JVD, no thyromegaly, no carotid bruits Lungs: no use of accessory muscles, no dullness to percussion, decreased without rales or rhonchi , copious thick secretions Cardiovascular: Rhythm regular, heart sounds  normal, no murmurs, no peripheral edema Abdomen: soft and non-tender, no hepatosplenomegaly, BS normal. Musculoskeletal: No deformities, no cyanosis or clubbing Neuro: Initially responsive, but increasing lethargic, non-focal Skin:  Warm, no lesions/ rash   LABS:  CBC  Recent Labs Lab 10/14/13 0341 10/15/13 0345 10/16/13 0413   WBC 5.8 5.7 5.0  HGB 10.0* 10.2* 9.1*  HCT 29.7* 29.5* 26.5*  PLT 229 245 245   Coag's No results found for this basename: APTT, INR,  in the last 168 hours BMET  Recent Labs Lab 10/14/13 1525 10/16/13 0413 Oct 21, 2013 0540  NA 138 136* 137  K 4.2 3.1* 3.4*  CL 104 100 102  CO2 25 26 24   BUN 12 9 8   CREATININE 0.68 0.75 0.73  GLUCOSE 92 103* 95   Electrolytes  Recent Labs Lab 10/14/13 1525 10/16/13 0413 October 21, 2013 0540  CALCIUM 10.4 10.0 10.0   Sepsis Markers  Recent Labs Lab 10/13/13 1230  LATICACIDVEN 1.0   ABG  Recent Labs Lab 10/13/13 1137  PHART 7.429  PCO2ART 41.4  PO2ART 51.3*   Liver Enzymes  Recent Labs Lab 10/18/2013 1605  AST 15  ALT 7  ALKPHOS 87  BILITOT 0.3  ALBUMIN 2.4*   Cardiac Enzymes No results found for this basename: TROPONINI, PROBNP,  in the last 168 hours Glucose  Recent Labs Lab 10/13/13 1126 10/14/13 2004 07-Nov-2013 0954  GLUCAP 139* 79 166*    Imaging Dg Swallowing Func-speech Pathology  10/16/2013   Katherene Ponto [place], CCC-SLP     10/16/2013 11:27 AM Objective Swallowing Evaluation: Modified Barium Swallowing Study   Patient Details  Name: ALRICK CUBBAGE MRN: [id number] Date of Birth: [birth year]  Today's Date: 10/16/2013 Time: 1035-1100 SLP Time Calculation (min): 25 min  Past Medical History:  Past Medical History  Diagnosis Date  . HIV infection dx'd 2008  . SOB (shortness of breath)   . Chronic pain in right shoulder     scapula and arm for 2 months   . Anxiety   . Protein-calorie malnutrition   . Tegretol toxicity     history of  . 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 Grand mal seizure 03/04/12  . Epileptic seizures     "I've had them all my life" (08/24/2013)   Past Surgical History:  Past Surgical History  Procedure Laterality Date  . Fine needle aspiration    . Skin cancer excision Left 08/24/2013    wide excision cancer thigh/buttocks   . Wound debridement Left 06/24/2013    posterior thigh/notes 06/24/2013  . Melanoma excision Left 08/24/2013    Procedure: wide excision cancer of left buttock / leg ;   Surgeon: Shann Medal, MD;  Location: [place];  Service: General;   Laterality: Left;   HPI:  GREIG ALTERGOTT is an 58 y.o. male with multiple medical  co-morbidities including HIV, seizure disorder, small cell lung  cancer followed by Dr. Julien Nordmann and a SCC of the left posterior  thigh followed by Dr. Lucia Gaskins. Was in the ED prior to admission  for FTT and was discharged home with [facility] therapies. Presents to  see Dr. Julien Nordmann on 10/13/2010 where he was found to be clinically  dehydrated and that has prompted a direct admission to the  hospital. Wife states they are no longer able to care for him at  home and would like to consider SNF placement. Pt noted to become  significantly lethargic and developed trouble swallowing after  being given oxycontin. RN reprots his voice is wet and he is  coughing up thick secretions. Pt needs to have seizure medication  given today at 1200 or NG tube will need to be placed. CT chest  on 6/8 reports Secretions are seen within the mainstem bronchi  bilaterally, suspicious for aspiration. There is mild patchy  airspace disease seen in the superior segments of the lower lobes  bilaterally, aspiration pneumonitis. There is no history of pt  being seen by SLP. There is a pending meeting for palliative  care.      Assessment / Plan / Recommendation Clinical Impression  Dysphagia Diagnosis: Mild oral phase dysphagia;Moderate  pharyngeal phase dysphagia Clinical impression: Pt presents with a mild oral dysphagia with  slow transit and base of tongue weakness with mild lingual/base  of tongue residuals that spill to the valleculae post swallow.  Oropharyngeal dysphagia is primarily sensory based with a  moderate motor weakness. Pts swallow initiation is delayed,  leading to a pooling of liquids in the pyriforms with moderate to  large  sized boluses, and aspiration of that  liquid as the swallow  is initaited, with thin and nectar straw. There is also mildly  decreased laryngeal elevation and cricopharyngeal opening with  pyriform residuals. When pt does aspirate, it is silent. Cues for  coughing and throat clearing result in expectoration of tracheal  mucous which mixes with penetrate/aspirate and is not fully  removed.    Pt will be at risk of aspiration with all PO due to sensory  deficits, generalized weakness, secretions and progressive  decline. Severity of aspiration may be mitigated by a modified  diet of dys 2 (fine chopped) solids and nectar thick liquids,  given with small sips. Pt should be cued to swallow twice.  Informed RN that pt may take necessary meds whole in puree today,  will defer writing diet to MD, family and palliative care team  given his meeting today.     Treatment Recommendation  Therapy as outlined in treatment plan below    Diet Recommendation Dysphagia 2 (Fine chop);Nectar-thick liquid   Liquid Administration via: Cup;No straw Medication Administration: Whole meds with puree Supervision: Staff to assist with self feeding;Full  supervision/cueing for compensatory strategies Compensations: Slow rate;Small sips/bites;Multiple dry swallows  after each bite/sip Postural Changes and/or Swallow Maneuvers: Seated upright 90  degrees;Upright 30-60 min after meal    Other  Recommendations Oral Care Recommendations: Oral care Q4  per protocol Other Recommendations: Order thickener from pharmacy;Have oral  suction available   Follow Up Recommendations  Skilled Nursing facility    Frequency and Duration min 2x/week  2 weeks   Pertinent Vitals/Pain NA    SLP Swallow Goals     General HPI: ADORIAN GWYNNE is an 58 y.o. male with multiple  medical co-morbidities including HIV, seizure disorder, small  cell lung cancer followed by Dr. Julien Nordmann and a SCC of the left  posterior thigh followed by Dr. Lucia Gaskins. Was in the ED prior to   admission for FTT and was discharged home with [facility] therapies.  Presents to see Dr. Julien Nordmann on 10/13/2010 where he was found to be  clinically dehydrated and that has prompted a direct admission to  the hospital. Wife states they are no longer able to care for him  at home and would like to consider SNF placement. Pt noted to  become significantly lethargic and developed trouble swallowing  after being given oxycontin. RN reprots his voice is wet and he  is coughing up thick secretions. Pt needs to have seizure  medication given today at 1200 or NG tube will need to be placed.  CT chest on 6/8 reports Secretions are seen within the mainstem  bronchi bilaterally, suspicious for aspiration. There is mild  patchy airspace disease seen in the superior segments of the  lower lobes bilaterally, aspiration pneumonitis. There is no  history of pt being seen by SLP. There is a pending meeting for  palliative care.  Type of Study: Modified Barium Swallowing Study Reason for Referral: Objectively evaluate swallowing function Previous Swallow Assessment: none Diet Prior to this Study: NPO Temperature Spikes Noted: No Respiratory Status: Room air History of Recent Intubation: No Behavior/Cognition: Alert;Cooperative;Pleasant mood Oral Cavity - Dentition: Edentulous (few lower teeth) Oral Motor / Sensory Function: Within functional limits Self-Feeding Abilities: Able to feed self Patient Positioning: Upright in chair Baseline Vocal Quality: Wet Volitional Cough: Congested;Weak;Wet Volitional Swallow: Able to elicit Anatomy: Within functional limits Pharyngeal Secretions:  (tracheal secretions, mix with barium  post aspiration)    Reason for Referral Objectively evaluate swallowing function  Oral Phase Oral Preparation/Oral Phase Oral Phase: Impaired Oral - Nectar Oral - Nectar Cup: Weak lingual manipulation;Delayed oral  transit;Lingual/palatal residue Oral - Nectar Straw: Weak lingual manipulation;Delayed oral   transit;Lingual/palatal residue Oral - Thin Oral - Thin Cup: Weak lingual manipulation;Delayed oral  transit;Lingual/palatal residue Oral - Thin Straw: Weak lingual manipulation;Delayed oral  transit;Lingual/palatal residue Oral - Solids Oral - Puree: Weak lingual manipulation;Delayed oral transit Oral - Mechanical Soft: Weak lingual manipulation;Delayed oral  transit;Impaired mastication (poor dentition) Oral - Pill: Delayed oral transit   Pharyngeal Phase Pharyngeal Phase Pharyngeal Phase: Impaired Pharyngeal - Nectar Pharyngeal - Nectar Cup: Delayed swallow initiation;Premature  spillage to pyriform sinuses;Reduced tongue base  retraction;Reduced laryngeal elevation;Pharyngeal residue -  pyriform sinuses;Pharyngeal residue - valleculae Pharyngeal - Nectar Straw: Delayed swallow initiation;Premature  spillage to pyriform sinuses;Reduced tongue base  retraction;Reduced laryngeal elevation;Pharyngeal residue -  pyriform sinuses;Pharyngeal residue -  valleculae;Penetration/Aspiration during swallow Penetration/Aspiration details (nectar straw): Material enters  airway, passes BELOW cords without attempt by patient to eject  out (silent aspiration) Pharyngeal - Thin Pharyngeal - Thin Cup: Delayed swallow initiation;Premature  spillage to pyriform sinuses;Reduced tongue base  retraction;Reduced laryngeal elevation;Pharyngeal residue -  pyriform sinuses;Pharyngeal residue -  valleculae;Penetration/Aspiration during  swallow;Penetration/Aspiration before swallow Penetration/Aspiration details (thin cup): Material enters  airway, passes BELOW cords without attempt by patient to eject  out (silent aspiration) Pharyngeal - Thin Straw: Delayed swallow initiation;Premature  spillage to pyriform sinuses;Reduced tongue base  retraction;Reduced laryngeal elevation;Pharyngeal residue -  pyriform sinuses;Pharyngeal residue -  valleculae;Penetration/Aspiration during swallow (with a chin  tuck) Penetration/Aspiration details (thin  straw): Material enters  airway, passes BELOW cords without attempt by patient to eject  out (silent aspiration) Pharyngeal - Solids Pharyngeal - Puree: Delayed swallow initiation;Premature spillage  to valleculae;Reduced anterior laryngeal mobility;Reduced tongue  base retraction;Pharyngeal residue - valleculae Pharyngeal - Mechanical Soft: Delayed swallow  initiation;Premature spillage to valleculae;Reduced anterior  laryngeal mobility;Reduced tongue base retraction;Pharyngeal  residue - valleculae Pharyngeal - Pill: Delayed swallow initiation;Premature spillage  to valleculae;Reduced anterior laryngeal mobility;Reduced tongue  base retraction;Pharyngeal residue - valleculae  Cervical Esophageal Phase    GO             Herbie Baltimore, MA CCC-SLP [phone]  DeBlois, Katherene Ponto 10/16/2013, 11:25 AM      CXR: ET tube in position, NG-tube not visualized  ASSESSMENT / PLAN:  PULMONARY A: Acute respiratory failure with hypoxia Likely underlying COPD ? Aspiration pneumonia Metastatic non-small cell lung cancer- with disease progression on recent imaging P:   Full vent support   CARDIOVASCULAR A: Complete heart block, new, prior EKG from 08/20/13 shows normal sinus rhythm and prolonged QT interval Rule out cardiac ischemia P:  External pacing Doubt he is a good candidate for intravenous pacer Serial troponins 12-lead EKG-with pacer turned off  RENAL A:  Hypokalemia At risk renal failure P:   replete Check magnesium  GASTROINTESTINAL A:  Protein calorie malnutrition Cachexia of malignancy P:   Place NG tube   HEMATOLOGIC A:  Anemia of chronic disease P:  Transfuse for hemoglobin less than 7  INFECTIOUS A:  Aspiration pneumonia AIDS- last CD4 count 100 from 04/2013 P:   Continue Unasyn Hold HAART  ENDOCRINE A:  At risk hypoglycemia   P:   Dextrose-containing drips chk CBG  NEUROLOGIC A:  Acute encephalopathy Seizure disorder Head CT negative P:   RASS goal: 0 to  -1 Fentanyl drip, Versed when necessary Continue anticonvulsants  TODAY'S SUMMARY: Unclear cause of sudden onset complete heart block,  this may be related to cardiac ischemia or structural heart disease. I doubt that he is a good candidate for invasive interventions. I discussed her with the patient's spouse- can explain his grave prognosis. I doubt that pressors or CPR or electric shocks would offer any benefit here -I explained this to her and accordingly such orders will be placed. We may have to be paternalistic in our decision-making here. Would continue palliative conversations.  I have personally obtained a history, examined the patient, evaluated laboratory and imaging results, formulated the assessment and plan and placed orders. CRITICAL CARE: The patient is critically ill with multiple organ systems failure and requires high complexity decision making for assessment and support, frequent evaluation and titration of therapies, application of advanced monitoring technologies and extensive interpretation of multiple databases. Critical Care Time devoted to patient care services described in this note is 60 minutes.    Kara Mead MD. Shade Flood. [facility] Pager [phone] If no response call [phone]    10/31/13, 10:51 AM

## 2013-10-20 NOTE — Consult Note (Signed)
Cardiology Consult Note-critical care time of 90 minutes  Admit date: 09/24/2013 Name: Roger Walton 58 y.o.  male DOB:  August 02, 1955 MRN:  [id number]  Today's date:  10-25-2013  Referring Physician:   Dr. Elsworth Soho  Reason for Consultation:   Complete heart block  IMPRESSIONS: 1. New-onset of complete heart block of uncertain etiology-unclear whether this is due to an acute cardiac event with ischemia or conduction system disease or involvement of cardiac conduction system with cancer 2. Metastatic non-small cell lung cancer 3. HIV 4. Protein calorie malnutrition  RECOMMENDATION: 1. The patient is currently intubated and unresponsive and sedated. He is in no pain. He has complete heart block and would not be considered to be a candidate for a permanent pacemaker due to his comorbidities and metastatic cancer. 2. Extensive period of time spent with his daughters, current wife, and multiple family members both at the bedside and in the room explaining his poor prognosis from his cancer and his poor quality of life 3. Comfort care-after explaining all of this to the family, they seem to accept this is a reasonable course.  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 He has been on carboplatin and paclitaxel without significant response. He has also had radiotherapy to his spine as well as lung nodules and has had resection of a metastatic mass in his inguinal area and has been receiving palliative radiotherapy to this area also. He has had a very poor quality of life at home and has had difficulty getting to and from the clinic for visits. He was found to have some progression of disease on a CT scan 10/01/2013. He was hospitalized with failure to thrive and today on rounds was noted to have an cardiac arrest was intubated and found to be  in complete heart block. Transcutaneous pacing was initiated and he was moved to the unit where he is currently sedated. There is no prior history of cardiac difficulty.  Past Medical History  Diagnosis Date  . HIV infection dx'd 2008  . SOB (shortness of breath)   . Chronic pain in right shoulder     scapula and arm for 2 months   . Anxiety   . Protein-calorie malnutrition   . Tegretol toxicity     history of  . 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 Grand mal seizure 03/04/12  . Epileptic seizures     "I've had them all my life" (08/24/2013)      Past Surgical History  Procedure Laterality Date  . Fine needle aspiration    . Skin cancer excision Left 08/24/2013    wide excision cancer thigh/buttocks  . Wound debridement Left 06/24/2013    posterior thigh/notes 06/24/2013  . Melanoma excision Left 08/24/2013    Procedure: wide excision cancer of left buttock / leg ;  Surgeon: Shann Medal, MD;  Location: [facility];  Service: General;  Laterality: Left;     Allergies:  has No Known Allergies.   Medications: Prior to Admission medications   Medication Sig Start Date End Date Taking? Authorizing Provider  carbamazepine (TEGRETOL) 200 MG tablet Take 400-600 mg by mouth 3 (three) times daily. Take 3 tabs (600mg ) in the morning, 2 tabs (400mg ) at  noon, and 2 tabs (400mg ) at bedtime.   Yes Historical Provider, MD  efavirenz-emtricitabine-tenofovir (ATRIPLA) 600-200-300 MG per tablet Take 1 tablet by mouth at bedtime.   Yes Historical Provider, MD  gabapentin (NEURONTIN) 300 MG capsule Take 300 mg by mouth 3 (three) times daily.   Yes Historical Provider, MD  megestrol (MEGACE) 40 MG/ML suspension Take 400 mg by mouth every morning.   Yes Historical Provider, MD  oxyCODONE (OXY IR/ROXICODONE) 5 MG immediate release tablet Take 5 mg by mouth every 4 (four) hours as needed for severe pain.   Yes Historical  Provider, MD  OxyCODONE (OXYCONTIN) 15 mg T12A 12 hr tablet Take 15 mg by mouth every 12 (twelve) hours.   Yes Historical Provider, MD  PHENobarbital (LUMINAL) 64.8 MG tablet Take 64.8 mg by mouth at bedtime.   Yes Historical Provider, MD    Family History: Family Status  Relation Status Death Age  . Mother Deceased   . Father Deceased     Social History:   reports that he has quit smoking. His smoking use included Cigarettes. He has a 43 pack-year smoking history. He has never used smokeless tobacco. He reports that he drinks about 3.6 ounces of alcohol per week. He reports that he uses illicit drugs (Marijuana).   History   Social History Narrative   Currently on disability for his seizure disorder   Has not worked since 2000 and 8          Review of Systems: Not obtainable  Physical Exam: BP 77/26  Pulse 97  Temp(Src) 98.8 F (37.1 C) (Oral)  Resp 14  Ht 6\' 1"  (1.854 m)  Wt 57.4 kg (126 lb 8.7 oz)  BMI 16.70 kg/m2  SpO2 23%  General appearance: Cachectic appearing black male currently unresponsive and intubated Head: Normocephalic, without obvious abnormality, atraumatic Lungs: clear to auscultation bilaterally Heart: Very distant heart sounds, external pacemaker applied pacing pectoral muscle Abdomen: Scaphoid and soft Pulses: Very difficult to feel Skin: Grossly normal Neurologic: Unresponsive not able to test formally Extremities: 2+ edema, surgical scar noted in right inguinal area  Labs: CBC  Recent Labs  10-22-2013 1102  WBC 10.9*  RBC 2.72*  HGB 9.2*  HCT 25.8*  PLT 267  MCV 94.9  MCH 33.8  MCHC 35.7  RDW 13.1  LYMPHSABS 1.4  MONOABS 0.6  EOSABS 0.0  BASOSABS 0.0   CMP   Recent Labs  10-22-2013 1102  NA 136*  K 4.8  CL 101  CO2 15*  GLUCOSE 235*  BUN 11  CREATININE 1.10  CALCIUM 10.0  PROT 6.1  ALBUMIN 2.0*  AST 53*  ALT 18  ALKPHOS 134*  BILITOT 0.3  GFRNONAA 71*  GFRAA 82*    Cardiac Panel (last 3 results)  Recent  Labs  10/22/13 1102  TROPONINI 0.70*   EKG: No EKG available from this admission. Previous EKG was normal. Underlying rhythm shows intermittent high degree AV block and complete heart block. Unable to verify capture with the pacemaker  Signed:  W. Doristine Church MD [facility]   Cardiology Consultant  2013-10-22, 4:27 PM

## 2013-10-20 NOTE — Discharge Summary (Signed)
Death Summary  Roger Walton ZDG:[id number] DOB: [birth year] DOA: October 16, 2013  PCP: Bobby Rumpf, MD PCP/Office notified: Will notified Dr Julien Nordmann.   Admit date: 10-16-2013 Date of Death: 10/21/13  Final Diagnoses:    Cardiac arrest, Complete Heart Block.    Metastatic Lung Cancer.    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 Julien Nordmann and a SCC of the left posterior thigh followed by Dr. Lucia Gaskins. Was in the ED yesterday for FTT and was discharged home with [facility] therapies. Presents to see Dr. Julien Nordmann today where he was found to be clinically dehydrated and that has prompted a direct admission to the hospital. Wife states they are no longer able to care for him at home and would like to consider SNF placement. Hospitalist admission has been requested.   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 His hospital course was complicated by encephalopathy probably related to pain medications, aspiration PNA. He has been treating with IV fluids, IV antibiotics, he was continue on his seizure medications. He during rounds on Oct 22, 2022 become unresponsive, apnea. Chest compression was started. His rhythm was bradycardic, complete Heart Block. He received atropine times 2. He was subsequently intubated and paced externally.  He was transfer to ICU. CCM discussed with Family and patient was made limited resuscitation. Cardiology was consulted.  Dr Wynonia Lawman evaluated patient. Patient is not Candidate for permanent pacemaker. Dr Wynonia Lawman discussed with multiple family comfort measure. Dr Lovena Le met with family, support was provide to the family. Patient has been externally paced, but pulse has not been registred . At 3;20 PM external pacer was turned off. Patient underline rhythm was asystole. His vent was discontinued. Family was present for exam prior to discontinuing treatment when we rechecked his pulse with doppler, and throughout discontinuation of external pacer and vent.      Time: 3;23.  SignedNiel Hummer A  [facility] 10-21-2013, 5:24 PM

## 2013-10-20 NOTE — Progress Notes (Addendum)
[facility] PROGRESS NOTE  Roger Walton UXN:[id number] DOB: 1955-10-31 DOA: 09/29/2013 PCP: Bobby Rumpf, MD  Assessment/Plan:  I came to do rounds, when patient become unresponsive, he was not breathing. I started Chest compression, CODE blue called. After chest compression, patient responded and started breathing, moving and he even said few words. His HR decrease to 40. He received Atropanine times 2, IV fluids. He had a subsequent episode of apnea. He was pace externally, and he was intubated. Dr Elsworth Soho help with care. Patient will be transfer to ICU. Cardiology was consulted. CBG at 166.    Encephalopathy: AMS. Patient was very lethargic, not following command on 6-25. Differential secondary to pain medication vs seizure.  CT head stat negative for bleed. Holding pain medications.  ABG with no hypercapnia.  Neurology consulted. Appreciate neuro help.    Dysphagia: Fail swallow evaluation. NG tube placed for medications. Patient removed NG tube 6-26. Speech recommended Dysphagia 2 diet. Patient is at risk for aspiration. He demanded to eat. Started on diet. Await Palliative care meeting.    PNA, possible aspiration; Patient with cough, fail swallow evaluation, Chest x ray with left lower lobe atelectasis vs consolidation. continue with Unasyn day 3.  azithromycin  for atypical coverage in case of Community acquired.   Adult FTT/Generalized Weakness/Dehydration  -Admit for rehydration purposes.  - B-12 644/TSH 2.3/RPR negative.  -PT/OT evals.  -Needs SNF.   HIV  -Continue antiviral meds. (atripla)   Seizure meds  -Continue phenobarbital.   -Tegretol.    Lung Cancer/SCC of left leg.  -Follow with Dr Julien Nordmann.  -received radiation 6-24, 25 left inguinal lymphadenopathy.   Severe Protein-Caloric Malnutrition  -Dietitian consult.  -Boost plus.    Hypokalemia; will replete with 40 Meq.   DVT Prophylaxis  -Lovenox   Code Status  -Full Code   Code Status: full  code.  Family Communication: care discussed with wife.  Disposition Plan: palliative care meeting for goals of care, Code status, nutritions needs, pain management.    Consultants:  Neurology  Palliative care.   Dr Julien Nordmann.   Procedures:  none  Antibiotics:  Unasyn 6-24.  Azithromycin 6-24.   HPI/Subjective: Patient become unresponsive during my evaluation.   Objective: Filed Vitals:   Oct 28, 2013 0612  BP: 105/65  Pulse: 96  Temp: 99.6 F (37.6 C)  Resp: 20    Intake/Output Summary (Last 24 hours) at 10/28/13 0935 Last data filed at 10-28-13 0723  Gross per 24 hour  Intake    930 ml  Output    750 ml  Net    180 ml   Filed Weights   09/27/2013 1259 10/13/13 1230 10/15/13 0400  Weight: 52.4 kg (115 lb 8.3 oz) 54.6 kg (120 lb 5.9 oz) 57.4 kg (126 lb 8.7 oz)    Exam:   General: unresponsive.   Cardiovascular: S 1, S 2 RRR  Respiratory: Bilateral ronchus.   Abdomen: BS present, soft, NT  Musculoskeletal: no edema. Multiple left inguinal lymphadenopathy.   Neuro: Intubated.   Data Reviewed: Basic Metabolic Panel:  Recent Labs Lab 10/13/13 1145 10/14/13 0341 10/14/13 1525 10/16/13 0413 October 28, 2013 0540  NA 145 139 138 136* 137  K 3.4* 3.6* 4.2 3.1* 3.4*  CL 106 104 104 100 102  CO2 28 24 25 26 24   GLUCOSE 126* 101* 92 103* 95  BUN 17 14 12 9 8   CREATININE 0.88 0.70 0.68 0.75 0.73  CALCIUM 11.2* 10.4 10.4 10.0 10.0   Liver Function Tests:  Recent Labs  Lab 10/18/2013 1605  AST 15  ALT 7  ALKPHOS 87  BILITOT 0.3  PROT 7.3  ALBUMIN 2.4*   No results found for this basename: LIPASE, AMYLASE,  in the last 168 hours  Recent Labs Lab 10/13/13 1144  AMMONIA 21   CBC:  Recent Labs Lab 10/13/13 0407 10/13/13 1145 10/14/13 0341 10/15/13 0345 10/16/13 0413  WBC 6.2 8.2 5.8 5.7 5.0  HGB 10.8* 8.1* 10.0* 10.2* 9.1*  HCT 31.8* 23.6* 29.7* 29.5* 26.5*  MCV 97.8 99.2 100.0 97.7 97.1  PLT 246 322 229 245 245   Cardiac Enzymes: No  results found for this basename: CKTOTAL, CKMB, CKMBINDEX, TROPONINI,  in the last 168 hours BNP (last 3 results) No results found for this basename: PROBNP,  in the last 8760 hours CBG:  Recent Labs Lab 10/13/13 1126 10/14/13 2004  GLUCAP 139* 79    Recent Results (from the past 240 hour(s))  MRSA PCR SCREENING     Status: None   Collection Time    10/12/2013  7:00 PM      Result Value Ref Range Status   MRSA by PCR NEGATIVE  NEGATIVE Final   Comment:            The GeneXpert MRSA Assay (FDA     approved for NASAL specimens     only), is one component of a     comprehensive MRSA colonization     surveillance program. It is not     intended to diagnose MRSA     infection nor to guide or     monitor treatment for     MRSA infections.  URINE CULTURE     Status: None   Collection Time    10/13/13 10:34 PM      Result Value Ref Range Status   Specimen Description URINE, RANDOM   Final   Special Requests NONE   Final   Culture  Setup Time     Final   Value: 10/14/2013 01:12     Performed at [place]     Final   Value: NO GROWTH     Performed at [facility]   Culture     Final   Value: NO GROWTH     Performed at [facility]   Report Status 10/14/2013 FINAL   Final     Studies: Dg Swallowing Func-speech Pathology  10/16/2013   Katherene Ponto [place], CCC-SLP     10/16/2013 11:27 AM Objective Swallowing Evaluation: Modified Barium Swallowing Study   Patient Details  Name: Roger Walton MRN: [id number] Date of Birth: 11-08-1955  Today's Date: 10/16/2013 Time: 1035-1100 SLP Time Calculation (min): 25 min  Past Medical History:  Past Medical History  Diagnosis Date  . HIV infection dx'd 2008  . SOB (shortness of breath)   . Chronic pain in right shoulder     scapula and arm for 2 months   . Anxiety   . Protein-calorie malnutrition   . Tegretol toxicity     history of  . 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 Grand mal seizure 03/04/12  . Epileptic seizures     "I've had them all my life" (08/24/2013)   Past Surgical History:  Past Surgical History  Procedure Laterality Date  . Fine needle aspiration    . Skin cancer excision Left 08/24/2013  wide excision cancer thigh/buttocks  . Wound debridement Left 06/24/2013    posterior thigh/notes 06/24/2013  . Melanoma excision Left 08/24/2013    Procedure: wide excision cancer of left buttock / leg ;   Surgeon: Shann Medal, MD;  Location: [place];  Service: General;   Laterality: Left;   HPI:  OBIE KALLENBACH is an 58 y.o. male with multiple medical  co-morbidities including HIV, seizure disorder, small cell lung  cancer followed by Dr. Julien Nordmann and a SCC of the left posterior  thigh followed by Dr. Lucia Gaskins. Was in the ED prior to admission  for FTT and was discharged home with [facility] therapies. Presents to  see Dr. Julien Nordmann on 10/13/2010 where he was found to be clinically  dehydrated and that has prompted a direct admission to the  hospital. Wife states they are no longer able to care for him at  home and would like to consider SNF placement. Pt noted to become  significantly lethargic and developed trouble swallowing after  being given oxycontin. RN reprots his voice is wet and he is  coughing up thick secretions. Pt needs to have seizure medication  given today at 1200 or NG tube will need to be placed. CT chest  on 6/8 reports Secretions are seen within the mainstem bronchi  bilaterally, suspicious for aspiration. There is mild patchy  airspace disease seen in the superior segments of the lower lobes  bilaterally, aspiration pneumonitis. There is no history of pt  being seen by SLP. There is a pending meeting for palliative  care.      Assessment / Plan / Recommendation Clinical Impression  Dysphagia Diagnosis: Mild oral phase dysphagia;Moderate  pharyngeal phase dysphagia Clinical impression: Pt presents with a mild oral dysphagia with   slow transit and base of tongue weakness with mild lingual/base  of tongue residuals that spill to the valleculae post swallow.  Oropharyngeal dysphagia is primarily sensory based with a  moderate motor weakness. Pts swallow initiation is delayed,  leading to a pooling of liquids in the pyriforms with moderate to  large sized boluses, and aspiration of that liquid as the swallow  is initaited, with thin and nectar straw. There is also mildly  decreased laryngeal elevation and cricopharyngeal opening with  pyriform residuals. When pt does aspirate, it is silent. Cues for  coughing and throat clearing result in expectoration of tracheal  mucous which mixes with penetrate/aspirate and is not fully  removed.    Pt will be at risk of aspiration with all PO due to sensory  deficits, generalized weakness, secretions and progressive  decline. Severity of aspiration may be mitigated by a modified  diet of dys 2 (fine chopped) solids and nectar thick liquids,  given with small sips. Pt should be cued to swallow twice.  Informed RN that pt may take necessary meds whole in puree today,  will defer writing diet to MD, family and palliative care team  given his meeting today.     Treatment Recommendation  Therapy as outlined in treatment plan below    Diet Recommendation Dysphagia 2 (Fine chop);Nectar-thick liquid   Liquid Administration via: Cup;No straw Medication Administration: Whole meds with puree Supervision: Staff to assist with self feeding;Full  supervision/cueing for compensatory strategies Compensations: Slow rate;Small sips/bites;Multiple dry swallows  after each bite/sip Postural Changes and/or Swallow Maneuvers: Seated upright 90  degrees;Upright 30-60 min after meal    Other  Recommendations Oral Care Recommendations: Oral care Q4  per protocol Other Recommendations:  Order thickener from pharmacy;Have oral  suction available   Follow Up Recommendations  Skilled Nursing facility    Frequency and Duration min  2x/week  2 weeks   Pertinent Vitals/Pain NA    SLP Swallow Goals     General HPI: Roger Walton is an 58 y.o. male with multiple  medical co-morbidities including HIV, seizure disorder, small  cell lung cancer followed by Dr. Julien Nordmann and a SCC of the left  posterior thigh followed by Dr. Lucia Gaskins. Was in the ED prior to  admission for FTT and was discharged home with [facility] therapies.  Presents to see Dr. Julien Nordmann on 10/13/2010 where he was found to be  clinically dehydrated and that has prompted a direct admission to  the hospital. Wife states they are no longer able to care for him  at home and would like to consider SNF placement. Pt noted to  become significantly lethargic and developed trouble swallowing  after being given oxycontin. RN reprots his voice is wet and he  is coughing up thick secretions. Pt needs to have seizure  medication given today at 1200 or NG tube will need to be placed.  CT chest on 6/8 reports Secretions are seen within the mainstem  bronchi bilaterally, suspicious for aspiration. There is mild  patchy airspace disease seen in the superior segments of the  lower lobes bilaterally, aspiration pneumonitis. There is no  history of pt being seen by SLP. There is a pending meeting for  palliative care.  Type of Study: Modified Barium Swallowing Study Reason for Referral: Objectively evaluate swallowing function Previous Swallow Assessment: none Diet Prior to this Study: NPO Temperature Spikes Noted: No Respiratory Status: Room air History of Recent Intubation: No Behavior/Cognition: Alert;Cooperative;Pleasant mood Oral Cavity - Dentition: Edentulous (few lower teeth) Oral Motor / Sensory Function: Within functional limits Self-Feeding Abilities: Able to feed self Patient Positioning: Upright in chair Baseline Vocal Quality: Wet Volitional Cough: Congested;Weak;Wet Volitional Swallow: Able to elicit Anatomy: Within functional limits Pharyngeal Secretions:  (tracheal secretions, mix with barium  post  aspiration)    Reason for Referral Objectively evaluate swallowing function   Oral Phase Oral Preparation/Oral Phase Oral Phase: Impaired Oral - Nectar Oral - Nectar Cup: Weak lingual manipulation;Delayed oral  transit;Lingual/palatal residue Oral - Nectar Straw: Weak lingual manipulation;Delayed oral  transit;Lingual/palatal residue Oral - Thin Oral - Thin Cup: Weak lingual manipulation;Delayed oral  transit;Lingual/palatal residue Oral - Thin Straw: Weak lingual manipulation;Delayed oral  transit;Lingual/palatal residue Oral - Solids Oral - Puree: Weak lingual manipulation;Delayed oral transit Oral - Mechanical Soft: Weak lingual manipulation;Delayed oral  transit;Impaired mastication (poor dentition) Oral - Pill: Delayed oral transit   Pharyngeal Phase Pharyngeal Phase Pharyngeal Phase: Impaired Pharyngeal - Nectar Pharyngeal - Nectar Cup: Delayed swallow initiation;Premature  spillage to pyriform sinuses;Reduced tongue base  retraction;Reduced laryngeal elevation;Pharyngeal residue -  pyriform sinuses;Pharyngeal residue - valleculae Pharyngeal - Nectar Straw: Delayed swallow initiation;Premature  spillage to pyriform sinuses;Reduced tongue base  retraction;Reduced laryngeal elevation;Pharyngeal residue -  pyriform sinuses;Pharyngeal residue -  valleculae;Penetration/Aspiration during swallow Penetration/Aspiration details (nectar straw): Material enters  airway, passes BELOW cords without attempt by patient to eject  out (silent aspiration) Pharyngeal - Thin Pharyngeal - Thin Cup: Delayed swallow initiation;Premature  spillage to pyriform sinuses;Reduced tongue base  retraction;Reduced laryngeal elevation;Pharyngeal residue -  pyriform sinuses;Pharyngeal residue -  valleculae;Penetration/Aspiration during  swallow;Penetration/Aspiration before swallow Penetration/Aspiration details (thin cup): Material enters  airway, passes BELOW cords without attempt by patient to eject  out (silent aspiration) Pharyngeal -  Thin Straw: Delayed swallow initiation;Premature  spillage to pyriform sinuses;Reduced tongue base  retraction;Reduced laryngeal elevation;Pharyngeal residue -  pyriform sinuses;Pharyngeal residue -  valleculae;Penetration/Aspiration during swallow (with a chin  tuck) Penetration/Aspiration details (thin straw): Material enters  airway, passes BELOW cords without attempt by patient to eject  out (silent aspiration) Pharyngeal - Solids Pharyngeal - Puree: Delayed swallow initiation;Premature spillage  to valleculae;Reduced anterior laryngeal mobility;Reduced tongue  base retraction;Pharyngeal residue - valleculae Pharyngeal - Mechanical Soft: Delayed swallow  initiation;Premature spillage to valleculae;Reduced anterior  laryngeal mobility;Reduced tongue base retraction;Pharyngeal  residue - valleculae Pharyngeal - Pill: Delayed swallow initiation;Premature spillage  to valleculae;Reduced anterior laryngeal mobility;Reduced tongue  base retraction;Pharyngeal residue - valleculae  Cervical Esophageal Phase    GO             Herbie Baltimore, MA CCC-SLP [phone]  DeBlois, Katherene Ponto 10/16/2013, 11:25 AM     Scheduled Meds: . ampicillin-sulbactam (UNASYN) IV  3 g Intravenous Q6H  . azithromycin  500 mg Intravenous Q24H  . carbamazepine  400 mg Oral 2 times per day  . carbamazepine  600 mg Oral Q breakfast  . efavirenz-emtricitabine-tenofovir  1 tablet Oral QHS  . enoxaparin (LOVENOX) injection  40 mg Subcutaneous Q24H  . gabapentin  300 mg Per Tube TID  . megestrol  400 mg Oral q morning - 10a  . 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     Niel Hummer A  [facility] Pager [phone]. If 7PM-7AM, please contact night-coverage at [email or web address], password [facility] 2013-11-15, 9:35 AM  LOS: 5 days

## 2013-10-20 NOTE — Progress Notes (Signed)
CSW continues to follow for d/c needs.

## 2013-10-20 NOTE — Consult Note (Addendum)
Patient Roger Walton      DOB: [birth year]      JJH:[id number]   Full note to follow; summary of goals of care and symptom management   We had been attempting to include Mr. Baudoin establishing his goals of care but he did not appear to have capacity to do so . Please see my previous summary.  Today the patient became unresponsive found to have bradycardia with pulselessness. His primary hospitalist was at the bedside talking with him at the time.  CPR was intiated immediately.  Please see Dr. Paulina Fusi note. ROSC with bradycardia was achieved and the patient was intubated and placed on a external pacemaker.  Limited resuscitation was negotiated by critical care.  I arrived to provide support and further goals of care.  I accompanied his wife Roger Walton to the bedside and explained what was being done to Roger Walton. She affirmed limited resuscitation with antiarrythmics only .  I spoke with her children by phone and they returned to the hospital for a family meeting .  By this time it was noted that Roger Walton no longer was registering a palpable pulse.  Multiple sites were checked and rechecked.  I segregated his family into two units-his wife was told that he was no longer living , but being supported only by the machines.  She accepted his death and permitted me to work with is tow daughters and extended family.  I explained the sudden crisis that Roger Walton had experienced, the response and the uncertainty of cause but the context within which it occurred made letting Roger Walton go on the most loving thing.  I shared with him that technically he had already died and gave them the opportunity to surround their dad with love as we removed his machines.  The families spiritual support was present throughout.  At 320 pm the external pacer was turned off.  His underlying rhythm was asystole.  His vent was discontinued and his chest was ausculted.  Time of death determined to be 3:23 pm.  Family was present for exam prior to  discontinuing treatment when we rechecked his pulse with doppler, and throughout discontinuation of external pacer and vent.    I provided work notices for his two daughters and emotional support to family and nursing.    Total time: 200 pm- 330 pm   Melissa L. Lovena Le, MD MBA The Palliative Medicine Team at [facility] Phone: [phone] Pager: [phone]

## 2013-10-20 DEATH — deceased

## 2013-10-22 ENCOUNTER — Ambulatory Visit: Payer: Medicare Other

## 2013-10-25 ENCOUNTER — Ambulatory Visit: Payer: Medicare Other | Admitting: Radiation Oncology

## 2013-10-25 ENCOUNTER — Ambulatory Visit: Payer: Medicare Other

## 2013-10-27 ENCOUNTER — Ambulatory Visit: Payer: Medicare Other

## 2013-11-01 ENCOUNTER — Ambulatory Visit: Payer: Medicare Other | Admitting: Infectious Diseases

## 2013-11-02 ENCOUNTER — Ambulatory Visit: Payer: Medicare Other | Admitting: Radiation Oncology

## 2013-11-25 ENCOUNTER — Ambulatory Visit: Payer: Medicare Other | Admitting: Radiation Oncology

## 2013-12-17 ENCOUNTER — Other Ambulatory Visit: Payer: Self-pay | Admitting: *Deleted

## 2015-05-10 IMAGING — CT CT CHEST W/ CM
2 of 4 series · 15 of 36 positions shown, 18 images · IV contrast (OMNIPAQUE)
Comparison: 09/28/2012 and PET 07/03/2012.

CLINICAL DATA: Non-small cell lung cancer.  Chemotherapy and
radiation therapy complete.  Right shoulder pain.  HIV.

CT CHEST WITH CONTRAST
TECHNIQUE: Multidetector CT imaging of the chest was performed
following the standard protocol during bolus administration of
intravenous contrast.
Contrast: 80mL OMNIPAQUE IOHEXOL 300 MG/ML  SOLN

[Series 2: chest with st · axial · 0.74mm/px · z∈[-316,-32]mm · 12 of 69 slices shown, 15 images]
[im 6/69  mediastinal]
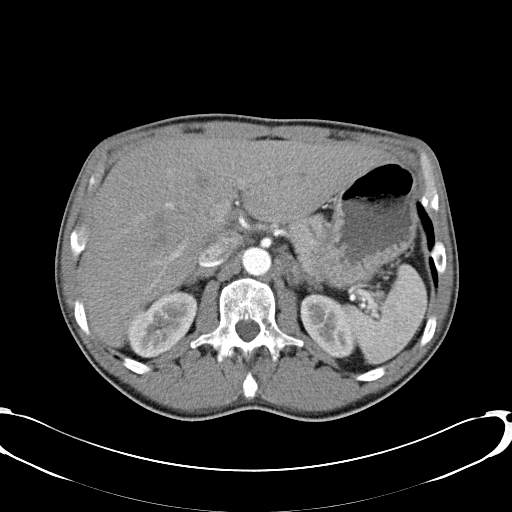
[im 6/69  lung]
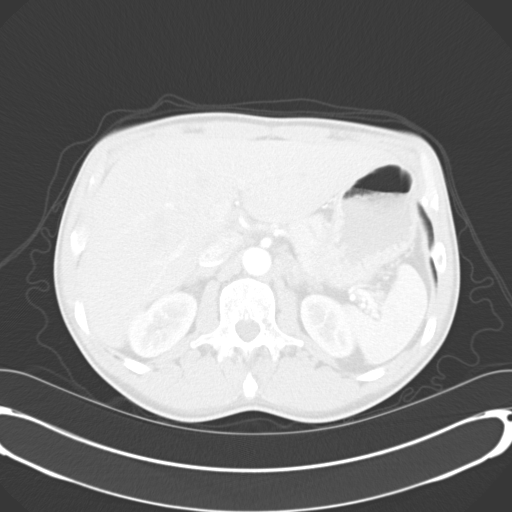
[im 11/69  lung]
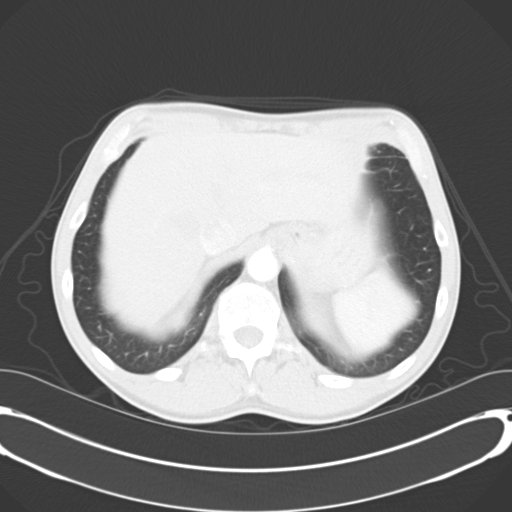
[im 16/69  lung]
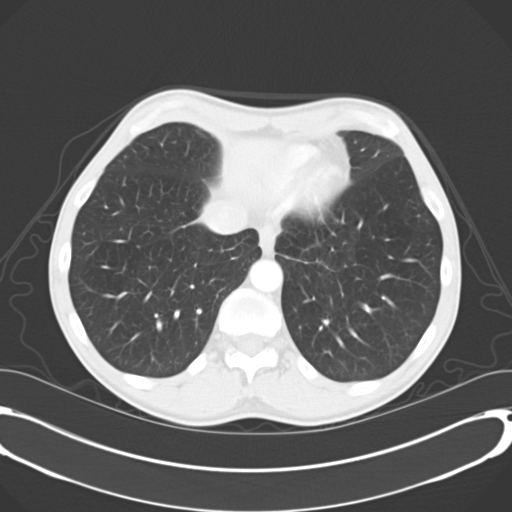
[im 21/69  lung]
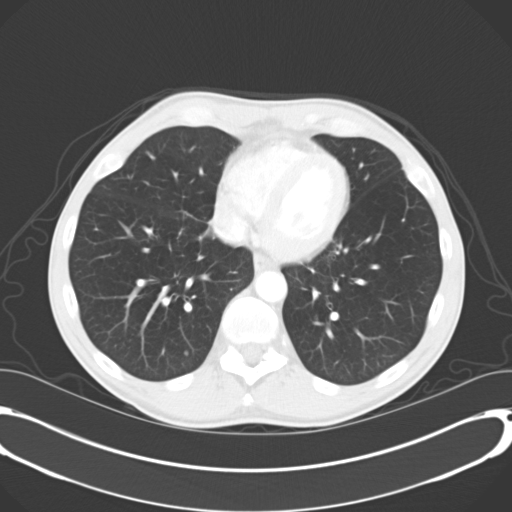
[im 27/69  mediastinal]
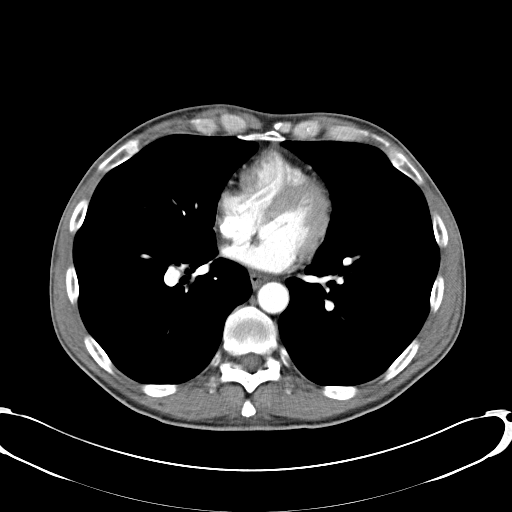
[im 27/69  lung]
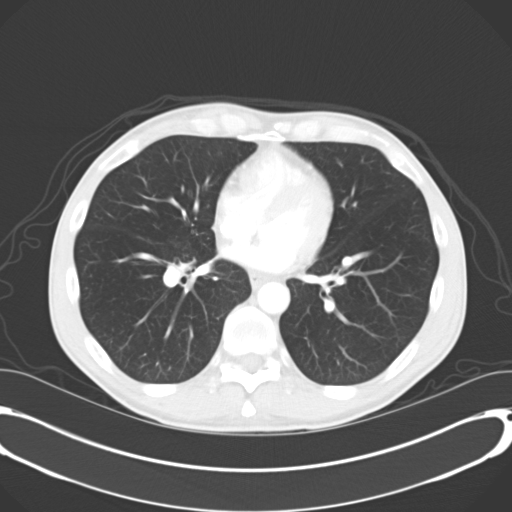
[im 32/69  lung]
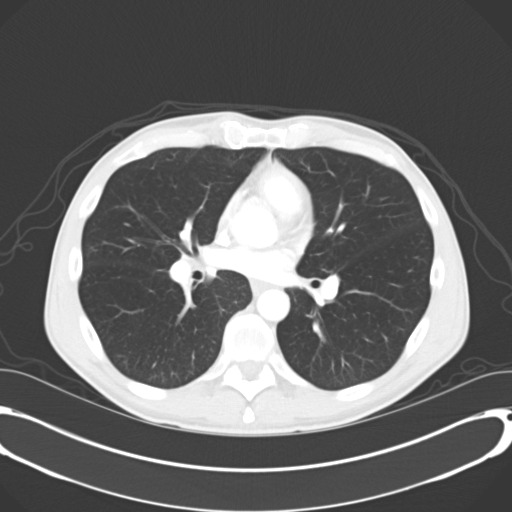
[im 37/69  lung]
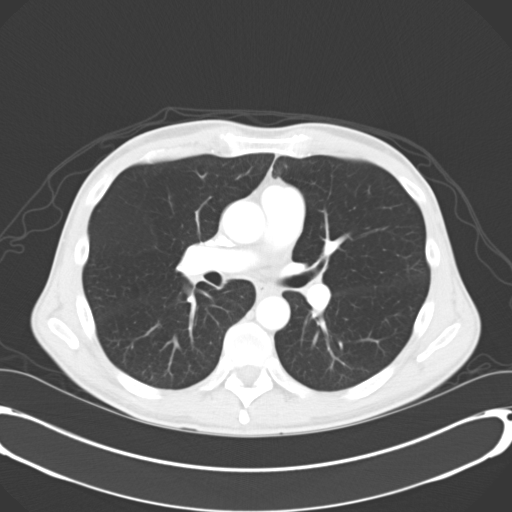
[im 42/69  lung]
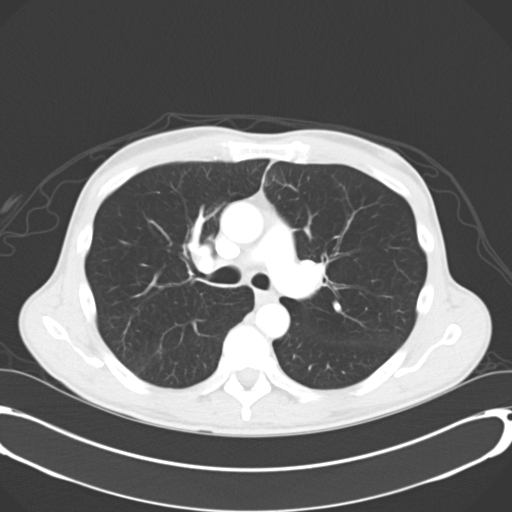
[im 48/69  mediastinal]
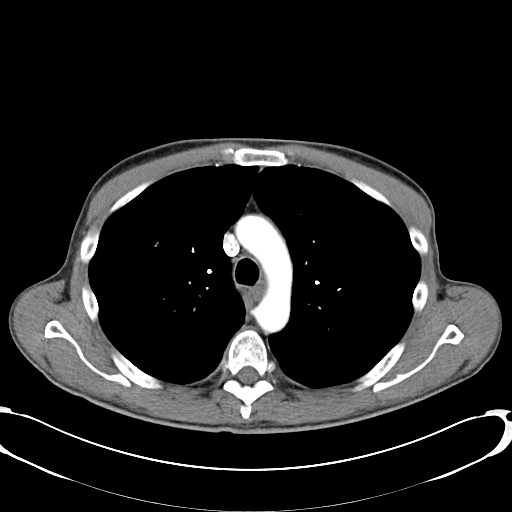
[im 48/69  lung]
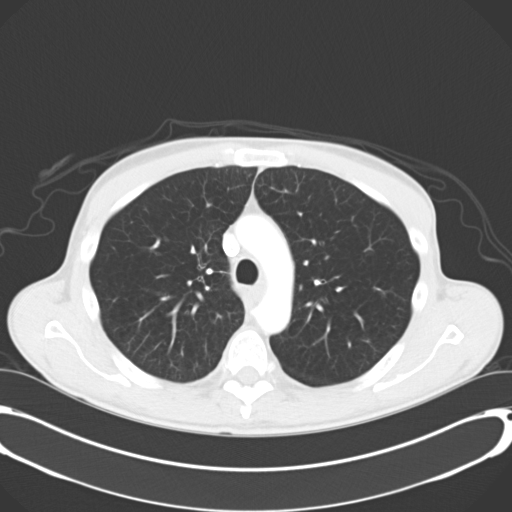
[im 53/69  lung]
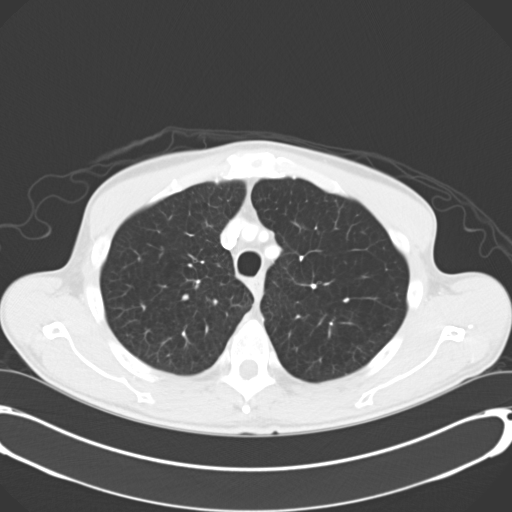
[im 58/69  lung]
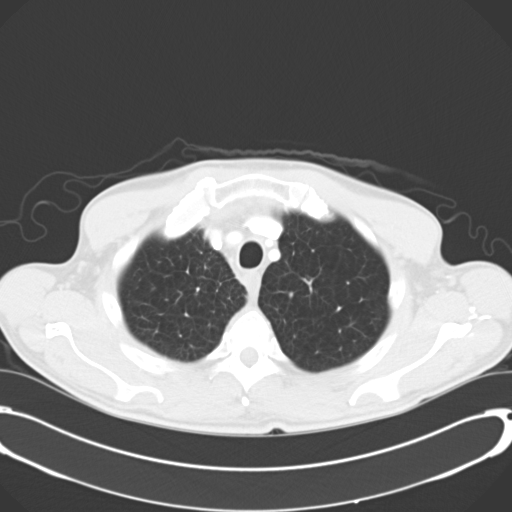
[im 63/69  lung]
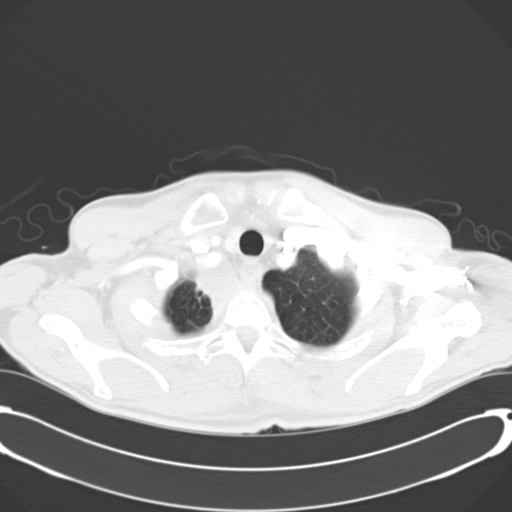

[Series 602: <mpr thick range> · coronal · 0.74mm/px · 3 of 78 slices shown]
[im 16/78  lung]
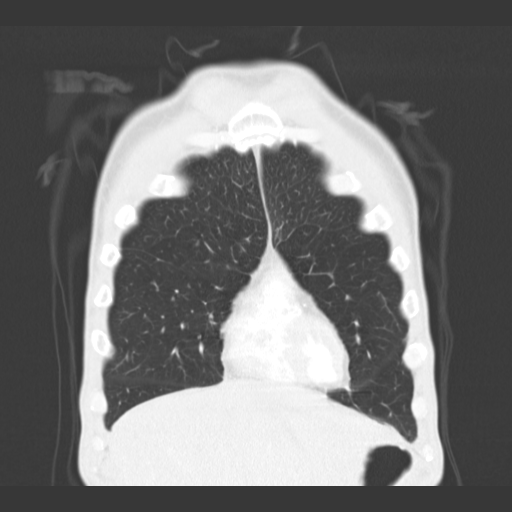
[im 31/78  lung]
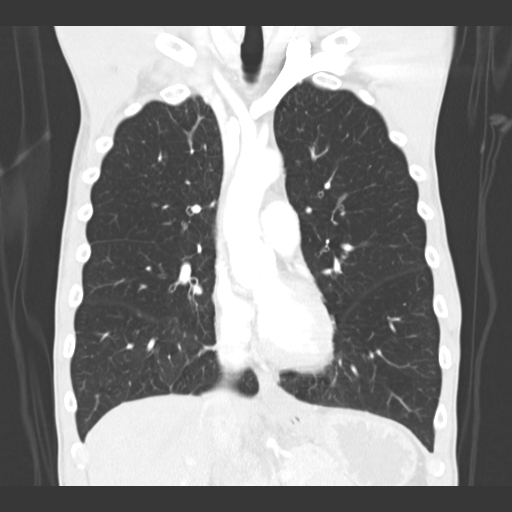
[im 47/78  lung]
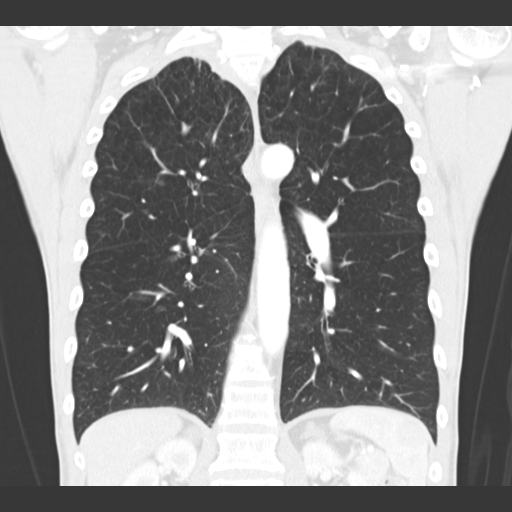

[15 of 36 positions shown; findings below may reference images not displayed]

FINDINGS: Infiltrative soft tissue is again seen in the right
supraclavicular fossa with the superior aspect incompletely imaged.
There is vascular encasement with inferior extension into the apex
of the right hemithorax.  When measured at the same level as on the
prior exam, it measures approximately 3.2 x 3.3 cm (previously
x 4.5 cm).  No pathologically enlarged mediastinal, hilar or
axillary lymph nodes.  Heart size normal.  No pericardial effusion.

Centrilobular emphysema. Right apical mass is discussed in the
previous paragraph. A nodule in the superior segment right lower
lobe has enlarged, now measuring 11 x 12 mm (previously 4 mm).
Additional small pulmonary nodules in the right lower lobe,
measuring up to 5 mm, are stable. An irregular density in the left
upper lobe (image 20) is difficult to measure but subjectively,
appears unchanged.  No pleural fluid.  Airway is unremarkable.

Incidental imaging of the upper abdomen shows a 2.2 x 1.6 cm low
attenuation lesion in the right adrenal gland, stable.  A 2.7 cm
intermediate attenuation lesion in the lateral spleen is unchanged.
However, there may be a new 1.3 cm lesion in the posterior spleen
(image 62). Although not definitely seen on prior exams, there is a
similar contour bulge along the posteromedial spleen, suggesting
presence on prior exams.  Heterogeneity is seen inferiorly within
the spleen as well (image 65).  No worrisome lytic or sclerotic
lesions.
IMPRESSION: 1.  Interval decrease in size of a right superior sulcus tumor with
associated vascular encasement.
2.  Enlarging right lower lobe nodule, most consistent with
bronchogenic carcinoma.
3.  Additional scattered pulmonary nodules and irregular nodular
densities are unchanged.  Continued attention on follow-up exams is
warranted.
4.  Dominant lesion with the spleen is stable. Additional lesions
or heterogeneity within the spleen, as discussed above.  Continued
attention on follow-up exams is warranted.
5.  Right adrenal adenoma.
